# Patient Record
Sex: Female | Born: 1955 | Race: White | Hispanic: No | Marital: Married | Smoking: Never smoker
Health system: Southern US, Community
[De-identification: ages and names within clinical notes are randomized; demographics above are authoritative.]

## PROBLEM LIST (undated history)

## (undated) DIAGNOSIS — E785 Hyperlipidemia, unspecified: Secondary | ICD-10-CM

## (undated) DIAGNOSIS — T7840XA Allergy, unspecified, initial encounter: Secondary | ICD-10-CM

## (undated) DIAGNOSIS — M5090 Cervical disc disorder, unspecified, unspecified cervical region: Secondary | ICD-10-CM

## (undated) HISTORY — DX: Hyperlipidemia, unspecified: E78.5

## (undated) HISTORY — DX: Cervical disc disorder, unspecified, unspecified cervical region: M50.90

## (undated) HISTORY — DX: Allergy, unspecified, initial encounter: T78.40XA

---

## 1984-01-03 HISTORY — PX: TUBAL LIGATION: SHX77

## 1988-01-03 HISTORY — PX: ABDOMINAL HYSTERECTOMY: SHX81

## 1998-01-02 HISTORY — PX: CERVICAL DISC SURGERY: SHX588

## 1999-01-20 ENCOUNTER — Other Ambulatory Visit: Admission: RE | Admit: 1999-01-20 | Discharge: 1999-01-20 | Payer: Self-pay | Admitting: *Deleted

## 1999-05-11 ENCOUNTER — Emergency Department (HOSPITAL_COMMUNITY): Admission: EM | Admit: 1999-05-11 | Discharge: 1999-05-11 | Payer: Self-pay | Admitting: Emergency Medicine

## 2000-05-23 ENCOUNTER — Encounter: Payer: Self-pay | Admitting: Internal Medicine

## 2000-05-23 ENCOUNTER — Ambulatory Visit (HOSPITAL_COMMUNITY): Admission: RE | Admit: 2000-05-23 | Discharge: 2000-05-23 | Payer: Self-pay | Admitting: Internal Medicine

## 2000-05-31 ENCOUNTER — Encounter: Payer: Self-pay | Admitting: Neurology

## 2000-05-31 ENCOUNTER — Ambulatory Visit (HOSPITAL_COMMUNITY): Admission: RE | Admit: 2000-05-31 | Discharge: 2000-05-31 | Payer: Self-pay | Admitting: Neurology

## 2000-06-06 ENCOUNTER — Encounter: Payer: Self-pay | Admitting: Neurosurgery

## 2000-06-06 ENCOUNTER — Encounter: Admission: RE | Admit: 2000-06-06 | Discharge: 2000-06-06 | Payer: Self-pay | Admitting: Neurosurgery

## 2000-06-08 ENCOUNTER — Inpatient Hospital Stay (HOSPITAL_COMMUNITY): Admission: RE | Admit: 2000-06-08 | Discharge: 2000-06-09 | Payer: Self-pay | Admitting: Neurosurgery

## 2000-06-08 ENCOUNTER — Encounter: Payer: Self-pay | Admitting: Neurosurgery

## 2000-06-29 ENCOUNTER — Encounter: Payer: Self-pay | Admitting: Neurosurgery

## 2000-06-29 ENCOUNTER — Encounter: Admission: RE | Admit: 2000-06-29 | Discharge: 2000-06-29 | Payer: Self-pay | Admitting: Neurosurgery

## 2003-11-13 ENCOUNTER — Emergency Department (HOSPITAL_COMMUNITY): Admission: EM | Admit: 2003-11-13 | Discharge: 2003-11-13 | Payer: Self-pay | Admitting: Family Medicine

## 2004-03-24 ENCOUNTER — Other Ambulatory Visit: Admission: RE | Admit: 2004-03-24 | Discharge: 2004-03-24 | Payer: Self-pay | Admitting: Internal Medicine

## 2004-04-04 ENCOUNTER — Ambulatory Visit: Payer: Self-pay | Admitting: Gastroenterology

## 2004-04-25 ENCOUNTER — Ambulatory Visit: Payer: Self-pay | Admitting: Gastroenterology

## 2006-01-03 ENCOUNTER — Ambulatory Visit (HOSPITAL_COMMUNITY): Admission: RE | Admit: 2006-01-03 | Discharge: 2006-01-03 | Payer: Self-pay | Admitting: Internal Medicine

## 2009-07-13 ENCOUNTER — Other Ambulatory Visit: Admission: RE | Admit: 2009-07-13 | Discharge: 2009-07-13 | Payer: Self-pay | Admitting: Internal Medicine

## 2010-05-20 NOTE — H&P (Signed)
[place]. [facility]  Patient:    Kristina Hunter, Kristina Hunter                      MRN: [id number] Adm. 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 Sandria Manly, with a recent onset of stumbling with her right leg.  She said that she was fine until May 14, when she began having difficulty walking on the right side.  She also noted numbness and tingling in her right arm.  She describes that her right hand is painful, particularly when she tries to do things such as office work, Diplomatic Services operational officer, or computer work.  She notes problems with her balance with walking.  She denies any bowel or bladder dysfunction, although she does note decreased awareness and need to have a bowel movement and says she is near to having accidents but has not had any bowel accidents.  She denies any left-sided symptoms.  Kristina Hunter presented with an MRI of the brain and cervical spine.  The MRI of the cervical spine was performed at the request of Dr. Sandria Manly after a brain MRI demonstrated some minimal punctate white matter changes on T2 images but these were not terribly significant.  Her cervical MRI shows that she has spondylitic degenerative disk disease with baseline spinal stenosis at C3-4, C4-5, and C5-6 levels as well as some degenerative changes at the C6-7 disk level.  There is a large bilobed herniated disk at the C5-6 level which significant indents the spinal cord and on T-2 weighted images there is evidence of significantly increased signal within the cervical spinal port suggestive of  edema.  REVIEW OF SYSTEMS:  Detailed review of systems sheet was reviewed with the patient.  Pertinent positives include:  MUSCULOSKELETAL:  She notes arm weakness and leg weakness.  All other systems negative.  PAST MEDICAL HISTORY:  Operations significant for hysterectomy in 1989.  CURRENT MEDICATIONS: 1. Lipitor 10 mg daily for elevated cholesterol. 2. Cardura 4 mg daily for elevated blood pressure. 3. Clarinex 5 mg daily for allergies. 4. Flonase 1-2 sprays daily for allergies.  ALLERGIES:  She notes that she had a reaction to an ANTIBIOTIC, which caused her to develop a skin rash after treatment for sinus infection but she did not remember the name of that antibiotic.  She did not think it was penicillin.  I advised her to seek out that information so that she has it for future health encounters.  FAMILY HISTORY:  Mother is 41 in good health.  Father is 30 in good health.  SOCIAL HISTORY:  She is a nonsmoker, nondrinker.  No history of substance abuse or recent weight gain or loss.  She is 5 feet 10 inches tall and 240 pounds.  DIAGNOSTIC STUDIES:  As above.  PHYSICAL EXAMINATION:  GENERAL:  On examination today, Kristina Hunter is a very pleasant, cooperative, obese white female in no acute distress.  VITAL SIGNS:  Blood pressure 130/80 left arm seated and pulse is 80 and  regular.  HEENT:  Normocephalic, atraumatic.  The pupils are equal, round and reactive to light.  Extraocular muscles are full.  Sclerae white.  Conjunctivae pink. Oropharynx benign.  Uvula midline.  NECK:  There are no masses, meningismus, deformities, tracheal deviation, jugular venous distention, or carotid bruits.  There is normal cervical range of motion.  Spurlings test is negative without reproducible radicular pain turning the patients head to either side.  Positive Lhermittes sign with axial compression.  The patient notes that she has Lhermittes on flexion of her neck.  RESPIRATORY:  There  is normal respiratory effort with good intercostal function.  LUNGS:  Clear to auscultation.  There are no rales, rhonchi, or wheezes.  CARDIOVASCULAR:  Her heart is regular rate and rhythm to auscultation.  No murmurs are appreciated.  There is no extremity edema, clubbing, or cyanosis. There are palpable pedal pulses.  ABDOMEN:  Soft, nontender.  No hepatosplenomegaly appreciated or masses. There are active bowel sounds.  No guarding or rebound.  MUSCULOSKELETAL:  The patient is able to walk about the examining room with a normal heel-toe and casual gait.  NEUROLOGIC:  The patient is oriented to time, person, and place.  Notes good recall of both recent and remote memory with normal attention span and concentration.  She speaks clearly with clear and fluent speech and exhibits normal language function and appropriate fund of knowledge.  Cranial nerve examination:  Pupils are equal, round and reactive to light.  Extraocular movements are full.  Visual fields are full to confrontational testing. Facial sensation and facial motor intact and symmetric.  Hearing is intact to finger rub.  Palate is upgoing.  Shoulder shrug is symmetric.  Tongue protrudes in the midline.  Motor examination:  Motor strength is 4 to 4-/5 right triceps strength and 4-/5 right hand intrinsics, finger extensor, and wrist flexion strength.  Full left upper extremity strength in all motor groups.  In the lower extremities, motor strength is 5/5 in hip flexion-extension, quadriceps, hamstrings, plantar flexion, dorsiflexion, and extensor hallucis longus.  Sensory examination:  She notes some numbness in the right side of her body, both in the upper and lower extremities compared to the left side.  There is no clear pin sensory level.  Deep tendon reflexes are 2+ in the right biceps, 2 in the left biceps, 3 in right triceps, 2 in the left triceps, 3+ at the right knee, 2 in the left knee, 2 at the right ankle, 1  at the left ankle.  Great toes are downgoing on the left and upgoing on the right.  She has positive Hoffmanns sign on the right and negative on the  left.  Cerebellar examination:  Normal coordination in the upper and lower extremities and normal rapid alternating movements.  Romberg test is negative.  IMPRESSIONS AND RECOMMENDATIONS:  Kristina Hunter is a 55 year old woman with cervical spondylitic myelopathy with cervical spinal cord compression secondary to her herniated disk at the C5-6 level.  She has some mild degenerative changes and spinal cord compression at C3-4, C4-5, and to a lesser degree at the C6-7 level.  I recommended, based on Kristina Hunter examination and imaging findings, that she undergo an anterior cervical diskectomy and fusion at the C5-6 level.  I went over diagnostic studies in detail and reviewed surgical models and also discussed the exact nature of the surgical procedure, attendant risks, potential benefits, and typical operative and postoperative course.  I discussed the risks of surgery which include but are not  limited to the risks of anesthesia, blood loss, infection, injury to various neck structures including trachea and esophagus which could cause either temporary or permanent swallowing difficulties, and also the potential for perforation of the esophagus which might require operative intervention, larynx, recurrent laryngeal nerve which could cause either temporary or permanent vocal cord paralysis resulting in either temporary or permanent voice changes, injury to the cervical nerve roots which could cause either temporary or permanent arm pain and numbness and/or weakness.  There is a small chance of injury to the spinal cord which could cause paralysis.  There is also the potential for malplacement of instrumentation, fusion failure, need for repeat surgery, degenerative disease at other levels of the neck, failure to relieve pain, worsening of her  pain.  I also discussed that she will lose some neck mobility related to the surgery.  She understands this and wishes to proceed.  Surgery was set up for June 08, 2000. DD:  06/08/00 TD:  06/08/00 Job: [id number]

## 2010-05-20 NOTE — Op Note (Signed)
[place]. [facility]  Patient:    Kristina Hunter, Kristina Hunter                      MRN: [id number] Proc. Date: 06/08/00 Adm. Date:  [id number] Attending:  Josie Saunders                           Operative Report  PREOPERATIVE DIAGNOSES:  Herniated disk, spondylosis, stenosis, and cervical myelopathy with cervical degenerative disease at the C5-6 level.  POSTOPERATIVE DIAGNOSES:  Herniated disk, spondylosis, stenosis, and cervical myelopathy with cervical degenerative disease at the C5-6 level.  OPERATION:  Anterior cervical diskectomy and fusion at C5-6 with allograft anterior cervical plate.  SURGEON:  Danae Orleans. Venetia Maxon, M.D.  ANESTHESIA:  General endotracheal anesthesia.  ESTIMATED BLOOD LOSS:  Minimal.  COMPLICATIONS:  None.  DISPOSITION:  Recovery.  INDICATIONS:  Kristina Hunter is a 55 year old woman with a cervical myelopathy. She drags her right and has right arm numbness and right leg numbness and has hyperreflexia on the right side of her body.  She has a significant bilobe disk herniation at the C5-6 level causing spinal cord compression and has increased cord signal on MRI.  Brain MRI was fairly unremarkable.  It was elected to take Kristina Hunter to surgery for anterior cervical diskectomy and fusion.  DESCRIPTION OF PROCEDURE:  Kristina Hunter is brought to the operating room. Following satisfactory and uncomplicated induction of general endotracheal anesthesia, placement of intravenous lines, she was placed in supine position on the operating table.  Her neck was maintained in neutral alignment during intubation and surgery.  She was placed in 10 pounds of Holter traction after the smooth and uncomplicated induction of general endotracheal anesthesia. She was placed on a horseshoe head holder.  Her shoulders were wrapped to facilitate x-ray visualization during the operation.  Her anterior neck was then prepped and draped in the usual sterile  fashion.  The area of planned incision was infiltrated with 0.25% Marcaine, 0.50% lidocaine, 1:200,000 epinephrine.  The incision was made in the midline to the anterior border of the sternocleidomastoid muscle, carried sharply through platysmal layer. Subplatysmal dissection was performed exposing the anterior border of the sternocleidomastoid muscle.  Using blunt dissection, the trachea and esophagus were kept medial, carotid sheath kept lateral, exposing the anterior cervical spine.  A spinal needle was placed at what was felt to be the C5-6 level, and intraoperative x-ray confirmed this to be the C5-6 level.  Subsequently, the longus colli muscles were then taken down from C5 to C6 bilaterally using electrocautery and key elevator.  Shadow-Line retractor was then placed.  The disk space at C5-6 was then incised with a #15 blade, and disk material was removed in a piecemeal fashion.  Subsequently, the microscope was brought into the field.  Using microscopic visualization, the end plates of C5 and C6 were decorticated with high-speed drill (MicroMax drill with a 2 mm bur).   There were large osteophytes both from C6 as well as from the C5 levels, and these were drilled down with the drill.  Subsequently, the posterior longitudinal ligament was incised with an arachnoid knife, and the ligaments as well as the residual osteophytes were removed with a variety of 2 and 3 mm gold tipped Kerrison rongeurs.  Both C6 nerve roots were decompressed.  A large osteophyte was removed from the left side, decompressing the lateral spinal cord and C6 nerve root.  Hemostasis  was assured with Gelfoam soaked in thrombin, and 8 mm allograft tricortical hip iliac crest graft was cut to a depth of 13 mm and was inserted in the interspace and countersunk appropriately.  The Holter traction was removed.  The anterior surface of the cervical spine was then prepared by plating.  A 16 mm Tether anterior cervical  plate was then affixed to the anterior cervical spine with two screws in C5 and two screws in C6.  The 4 x 13 mm variable angle screws were used.  All screws had excellent purchase.  Locking mechanisms were engaged.  The wound was inspected to hemostasis.  Final x-ray confirmed positioning of bone graft and anterior cervical plate.  Subsequently, the wound was irrigated with bacitracin and saline.  The platysmal layer was reapproximated with 3-0 Vicryl interrupted inverted sutures.  Skin edges were reapproximated with running 4-0 Vicryl subcuticular stitch.  The wound was dressed with Benzoin and Steri-Strips and Telfa gauze tape.  The patient was extubated in the operating room and taken to the recovery room in stable and satisfactory condition having tolerated the operation well.  Counts were correct at the end of the case. DD:  06/08/00 TD:  06/08/00 Job: [id number]

## 2011-03-24 ENCOUNTER — Encounter: Payer: Self-pay | Admitting: Gastroenterology

## 2012-01-29 ENCOUNTER — Encounter: Payer: Self-pay | Admitting: Gastroenterology

## 2013-02-07 ENCOUNTER — Other Ambulatory Visit: Payer: Self-pay | Admitting: Physician Assistant

## 2013-02-07 MED ORDER — CIPROFLOXACIN HCL 500 MG PO TABS: 500.0000 mg | ORAL_TABLET | Freq: Two times a day (BID) | ORAL | Status: AC

## 2013-03-22 ENCOUNTER — Other Ambulatory Visit: Payer: Self-pay | Admitting: Internal Medicine

## 2013-03-22 MED ORDER — CIPROFLOXACIN HCL 500 MG PO TABS: 500.0000 mg | ORAL_TABLET | Freq: Two times a day (BID) | ORAL | Status: AC

## 2013-04-02 ENCOUNTER — Other Ambulatory Visit: Payer: Self-pay | Admitting: *Deleted

## 2013-04-02 ENCOUNTER — Other Ambulatory Visit: Payer: Self-pay | Admitting: Internal Medicine

## 2013-04-02 ENCOUNTER — Telehealth: Payer: Self-pay | Admitting: *Deleted

## 2013-04-02 MED ORDER — CIPROFLOXACIN HCL 500 MG PO TABS: 500.0000 mg | ORAL_TABLET | Freq: Two times a day (BID) | ORAL | Status: DC

## 2013-04-02 NOTE — Telephone Encounter (Signed)
Patient called and states she took Cipro Dr Melford Aase called in last weekend and now symptoms of UTI returned. Ok to call in RX for Cipro 500 mg #14 to [facility] sig: 1 tab BID pc.  Patient scheduled NV to  check UA and urine culture (dx-595.0) in 3-4 weeks.

## 2013-04-03 ENCOUNTER — Telehealth: Payer: Self-pay | Admitting: *Deleted

## 2013-04-03 ENCOUNTER — Other Ambulatory Visit: Payer: Self-pay | Admitting: Emergency Medicine

## 2013-04-03 MED ORDER — CIPROFLOXACIN HCL 500 MG PO TABS: 500.0000 mg | ORAL_TABLET | Freq: Two times a day (BID) | ORAL | Status: AC

## 2013-04-03 NOTE — Telephone Encounter (Signed)
RX FOR CIPRO WAS SUPPOSE TO GO TO [facility] WILL YOU REROUTE THE RX?  PLEASE & THANKS MS

## 2013-04-03 NOTE — Telephone Encounter (Signed)
pls inform her I am sorry that happened and it has been sent to [facility]

## 2013-04-11 ENCOUNTER — Other Ambulatory Visit: Payer: Self-pay | Admitting: Internal Medicine

## 2013-04-23 ENCOUNTER — Other Ambulatory Visit: Payer: Self-pay | Admitting: Internal Medicine

## 2013-04-23 ENCOUNTER — Encounter (INDEPENDENT_AMBULATORY_CARE_PROVIDER_SITE_OTHER): Payer: Self-pay

## 2013-04-23 ENCOUNTER — Ambulatory Visit (INDEPENDENT_AMBULATORY_CARE_PROVIDER_SITE_OTHER): Payer: 59 | Admitting: *Deleted

## 2013-04-23 DIAGNOSIS — N3000 Acute cystitis without hematuria: Secondary | ICD-10-CM

## 2013-04-23 LAB — URINALYSIS, ROUTINE W REFLEX MICROSCOPIC
Bilirubin Urine: NEGATIVE
Glucose, UA: NEGATIVE mg/dL
Hgb urine dipstick: NEGATIVE
Ketones, ur: NEGATIVE mg/dL
Nitrite: NEGATIVE
Protein, ur: NEGATIVE mg/dL
Specific Gravity, Urine: 1.006 (ref 1.005–1.030)
Urobilinogen, UA: 0.2 mg/dL (ref 0.0–1.0)
pH: 6 (ref 5.0–8.0)

## 2013-04-23 LAB — URINALYSIS, MICROSCOPIC ONLY
Bacteria, UA: NONE SEEN
CRYSTALS: NONE SEEN
Casts: NONE SEEN
RBC / HPF: NONE SEEN RBC/hpf (ref <3)
SQUAMOUS EPITHELIAL / LPF: NONE SEEN
WBC UA: NONE SEEN WBC/hpf (ref <3)

## 2013-04-23 NOTE — Progress Notes (Signed)
Patient ID: Kristina Hunter, female   DOB: Dec 03, 1955, 58 y.o.   MRN: [id number] Patient presents for recheck UA/C&S.  Patient denies any current symptoms.  States was given three rounds of abx and just here to be sure infection has cleared.

## 2013-04-24 LAB — URINE CULTURE: Colony Count: 8000

## 2013-06-08 ENCOUNTER — Other Ambulatory Visit: Payer: Self-pay | Admitting: Emergency Medicine

## 2013-07-10 ENCOUNTER — Other Ambulatory Visit: Payer: Self-pay | Admitting: Emergency Medicine

## 2013-08-13 ENCOUNTER — Encounter: Payer: Self-pay | Admitting: Podiatry

## 2013-08-13 ENCOUNTER — Ambulatory Visit (INDEPENDENT_AMBULATORY_CARE_PROVIDER_SITE_OTHER): Payer: 59 | Admitting: Podiatry

## 2013-08-13 VITALS — BP 149/74 | HR 79 | Resp 15 | Ht 70.0 in | Wt 210.0 lb

## 2013-08-13 DIAGNOSIS — L03039 Cellulitis of unspecified toe: Secondary | ICD-10-CM

## 2013-08-13 MED ORDER — CEPHALEXIN 500 MG PO CAPS: 500.0000 mg | ORAL_CAPSULE | Freq: Three times a day (TID) | ORAL | Status: DC

## 2013-08-13 NOTE — Patient Instructions (Signed)

## 2013-08-13 NOTE — Progress Notes (Signed)
   Subjective:    Patient ID: Kristina Hunter, female    DOB: 1955/08/31, 58 y.o.   MRN: [id number]  HPI Comments: Kristina Hunter presents the office today with complaints of left hallux nail pain. She states that she stubbed the toe over the weekend and since the nail has become loose with some associated drainage from around the nail. She denies any systemic complaints at this fevers, chills, nausea, vomiting. No other complaints at this time.     Review of Systems  Skin:       Left big toenail pain  All other systems reviewed and are negative.      Objective:   Physical Exam  Nursing note and vitals reviewed. Constitutional: She is oriented to person, place, and time. She appears well-developed and well-nourished.  Musculoskeletal: She exhibits no edema.  Tenderness to palpation over the left hallux nail.  Neurological: She is alert and oriented to person, place, and time.  Protective sensation intact.  Skin:  Left hallux nail separated from the nailbed with serous drainage from around the nail. No associated erythema.   DP/PT pulses palpable 2/4 b/l. CRt <3 sec.         Assessment & Plan:  58 year old female with left hallux onycholysis, pain. -Conservative or surgical intervention was discussed the patient in detail including alternatives, risks, locations.  -At this time recommended total nail avulsion due to the separation of the nailbed and surrounding drainage and pain the nail. Patient agrees. - Following sterile skin preparation a total of 2.5 cc of the 1-1 mixture 2% lidocaine plain and 0.5% Marcaine plain was infiltrated and a hallux block fashion on the left foot. The skin was then prepped in normal sterile fashion. The left hallux nail with a completely removed. During the procedure there was noted to be a pocket of purulence underneath the nail. The area was then debrided and was flushed. There is no open wound in the nailbed. Silvadene was applied followed by a dry  sterile dressing. A tourniquet was utilized for the procedure at the conclusion was removed. Following the procedure the CRT< 3 sec. patient tolerated procedure well the any complications. -Due to the collection of purulence patient prescribed Keflex. -Post procedure instructions discussed with the patient in detail including daily soaks and covering the site was in a leg ointment and a Band-Aid. -Patient educated on signs and symptoms of infection and directed to call the office or go to the emergency room immediately if any occur. -Followup in 1 week or sooner if any problems are to arise.

## 2013-08-15 ENCOUNTER — Telehealth: Payer: Self-pay | Admitting: Podiatry

## 2013-08-15 NOTE — Telephone Encounter (Signed)
Called patient to follow up with her after her procedure, no answer. Directed to call with any questions/concerns.

## 2013-08-21 ENCOUNTER — Ambulatory Visit (INDEPENDENT_AMBULATORY_CARE_PROVIDER_SITE_OTHER): Payer: 59 | Admitting: Podiatry

## 2013-08-21 VITALS — BP 126/81 | HR 82 | Resp 16

## 2013-08-21 DIAGNOSIS — L03039 Cellulitis of unspecified toe: Secondary | ICD-10-CM

## 2013-08-21 NOTE — Patient Instructions (Signed)
Continue epsom salt soaks until healed. Antibiotic ointment and band-aid until healed. Monitor for any signs/symptoms of infection. Call the office immediately if any occur or go directly to the emergency room. Call with any questions/concerns.

## 2013-08-21 NOTE — Progress Notes (Signed)
Subjective:     Patient ID: Kristina Hunter, female   DOB: [birth year], 58 y.o.   MRN: [id number]  HPI Patient presents to the office for follow up after left hallux nail avulsion. She has continued with epsom salt soaks. Currently denies any pain. No drainage. Has been taking antibiotic as directed. Denies any systemic complaints such as fevers, chills, nausea, vomiting. Any complaints.  Review of Systems  All other systems reviewed and are negative.      Objective:   Physical Exam AAO x3, NAD DP/PT pulses palpable CRT < sec.  Left hallux nail status post removal without any clinical signs of infection. No drainage, erythema, ascending cellulitis. No tenderness to palpation Protective sensation intact.    Assessment:     58 year old female 1 week status post left hallux nail removal    Plan:     The patient is doing well without any clinical signs of infection. Continue with Epsom salt soaks until healed. Continue with antibiotic ointment over the area followed by a Band-Aid. Keep covered during the day and can uncover night. Finish the course of antibiotics. Clinical signs of infection discussed with the patient and directed to call the office immediately or go to the emergency room and are to occur. Followup in 2 weeks or sooner if any problems are to arise.

## 2013-09-04 ENCOUNTER — Ambulatory Visit: Payer: 59 | Admitting: Podiatry

## 2013-09-06 ENCOUNTER — Other Ambulatory Visit: Payer: Self-pay | Admitting: Physician Assistant

## 2013-09-20 DIAGNOSIS — Z9109 Other allergy status, other than to drugs and biological substances: Secondary | ICD-10-CM | POA: Insufficient documentation

## 2013-09-20 DIAGNOSIS — E785 Hyperlipidemia, unspecified: Secondary | ICD-10-CM | POA: Insufficient documentation

## 2013-09-20 DIAGNOSIS — T7840XA Allergy, unspecified, initial encounter: Secondary | ICD-10-CM

## 2013-09-22 ENCOUNTER — Encounter: Payer: Self-pay | Admitting: Physician Assistant

## 2013-09-22 ENCOUNTER — Ambulatory Visit (INDEPENDENT_AMBULATORY_CARE_PROVIDER_SITE_OTHER): Payer: 59 | Admitting: Physician Assistant

## 2013-09-22 VITALS — BP 122/78 | HR 76 | Temp 97.9°F | Resp 16 | Ht 69.5 in | Wt 222.0 lb

## 2013-09-22 DIAGNOSIS — Z1212 Encounter for screening for malignant neoplasm of rectum: Secondary | ICD-10-CM

## 2013-09-22 DIAGNOSIS — Z0000 Encounter for general adult medical examination without abnormal findings: Secondary | ICD-10-CM

## 2013-09-22 DIAGNOSIS — I10 Essential (primary) hypertension: Secondary | ICD-10-CM

## 2013-09-22 DIAGNOSIS — E785 Hyperlipidemia, unspecified: Secondary | ICD-10-CM

## 2013-09-22 DIAGNOSIS — N63 Unspecified lump in unspecified breast: Secondary | ICD-10-CM

## 2013-09-22 DIAGNOSIS — N6320 Unspecified lump in the left breast, unspecified quadrant: Secondary | ICD-10-CM

## 2013-09-22 LAB — CBC WITH DIFFERENTIAL/PLATELET
BASOS PCT: 1 % (ref 0–1)
Basophils Absolute: 0.1 10*3/uL (ref 0.0–0.1)
Eosinophils Absolute: 0.2 10*3/uL (ref 0.0–0.7)
Eosinophils Relative: 3 % (ref 0–5)
HCT: 40.9 % (ref 36.0–46.0)
HEMOGLOBIN: 13.7 g/dL (ref 12.0–15.0)
Lymphocytes Relative: 37 % (ref 12–46)
Lymphs Abs: 2.2 10*3/uL (ref 0.7–4.0)
MCH: 30.7 pg (ref 26.0–34.0)
MCHC: 33.5 g/dL (ref 30.0–36.0)
MCV: 91.7 fL (ref 78.0–100.0)
MONOS PCT: 7 % (ref 3–12)
Monocytes Absolute: 0.4 10*3/uL (ref 0.1–1.0)
NEUTROS ABS: 3.1 10*3/uL (ref 1.7–7.7)
Neutrophils Relative %: 52 % (ref 43–77)
Platelets: 302 10*3/uL (ref 150–400)
RBC: 4.46 MIL/uL (ref 3.87–5.11)
RDW: 12.9 % (ref 11.5–15.5)
WBC: 5.9 10*3/uL (ref 4.0–10.5)

## 2013-09-22 LAB — HEMOGLOBIN A1C
Hgb A1c MFr Bld: 6.1 % — ABNORMAL HIGH (ref <5.7)
Mean Plasma Glucose: 128 mg/dL — ABNORMAL HIGH (ref <117)

## 2013-09-22 NOTE — Progress Notes (Signed)
Complete Physical  Assessment and Plan: 1. Routine general medical examination at a health care facility - CBC with Differential - BASIC METABOLIC PANEL WITH GFR - Hepatic function panel - Lipid panel - TSH - Hemoglobin A1c - Insulin, fasting - Vit D  25 hydroxy (rtn osteoporosis monitoring) - Microalbumin / creatinine urine ratio - Urinalysis, Routine w reflex microscopic - Vitamin B12 - Magnesium - Iron and TIBC - Ferritin - EKG 12-Lead  2. Screening for rectal cancer - POC Hemoccult Bld/Stl (3-Cd Home Screen); Future  3. Other and unspecified hyperlipidemia -continue medications, check lipids, decrease fatty foods, increase activity.   4. Unspecified essential hypertension Continue HCTZ/monitor, DASH diet  5. Breast mass, left - MM Digital Diagnostic Unilat L; Future   Discussed med's effects and SE's. Screening labs and tests as requested with regular follow-up as recommended.  HPI 58 y.o. female  presents for a complete physical.  Her blood pressure has been controlled at home, today their BP is BP: 122/78 mmHg She does not workout due to stress/busy. She denies chest pain, shortness of breath, dizziness.  She is on cholesterol medication and denies myalgias. Her cholesterol is at goal. The cholesterol last visit was:  LDL 96  She has been working on diet and exercise for prediabetes, and denies paresthesia of the feet, polydipsia and polyuria. Last A1C in the office was: 5.9 Patient is on Vitamin D supplement.   She has had a dry cough for 3 weeks, a lot of sinus drainage 3 weeks ago but this has improved but she continues to have some sinus pressure.  She is on clarinex.  BMI is Body mass index is 32.32 kg/(m^2)., She is struggling with weight loss due to stress (at work), time limitations.  Wt Readings from Last 3 Encounters:  09/22/13 222 lb (100.699 kg)  08/13/13 210 lb (95.255 kg)  She has incontinence and takes vesicare which helps.    Current  Medications:  Current Outpatient Prescriptions on File Prior to Visit  Medication Sig Dispense Refill  . atorvastatin (LIPITOR) 10 MG tablet Take 1 tablet (10 mg total) by mouth daily.  90 tablet  0  . BABY ASPIRIN PO Take 81 mg by mouth daily.      . Calcium-Vitamin D (CALTRATE 600 PLUS-VIT D PO) Take by mouth daily.      Marland Kitchen desloratadine (CLARINEX) 5 MG tablet TAKE 1 TABLET DAILY (FOR ALLERGIES)  90 tablet  99  . Multiple Vitamins-Minerals (MULTIVITAMIN PO) Take by mouth daily.      . Omega-3 Fatty Acids (FISH OIL PO) Take by mouth daily.      . Solifenacin Succinate (VESICARE PO) Take by mouth.       No current facility-administered medications on file prior to visit.   Health Maintenance:   Immunization History  Administered Date(s) Administered  . Tdap 07/13/2009   Tetanus: 2011 Pneumovax: 1997 Flu vaccine: gets at work Zostavax: Pap: 2011 declines MGM: 06/2013 DEXA: Colonoscopy: 2006 due 2016 EGD:  Patient Care Team: [name], MD as PCP - General (Internal Medicine) Inda Castle, MD as Consulting Physician (Gastroenterology) Johnny Bridge, MD as Consulting Physician (Orthopedic Surgery) Simona Huh, MD as Consulting Physician (Dermatology) [name], eye doctor - Feb 2015 [facility] q 6 months  Allergies: No Known Allergies Medical History:  Past Medical History  Diagnosis Date  . Hyperlipidemia   . Allergy    Surgical History:  Past Surgical History  Procedure Laterality Date  . Tubal  ligation Bilateral 1986    BTL  . Abdominal hysterectomy  1990   Family History:  Family History  Problem Relation Age of Onset  . Arthritis Mother   . Hyperlipidemia Mother   . Hypertension Mother   . Hypertension Father   . Diabetes Father   . Hyperlipidemia Father   . Arthritis Father    Social History:  History  Substance Use Topics  . Smoking status: Never Smoker   . Smokeless tobacco: Never Used  . Alcohol Use: No    Review  of Systems: [X]  = complains of  [ ]  = denies  General: Fatigue [ ]  Fever [ ]  Chills [ ]  Weakness [ ]   Insomnia [ ] Weight change [ ]  Night sweats [ ]   Change in appetite [ ]  Eyes: Redness [ ]  Blurred vision [ ]  Diplopia [ ]  Discharge [ ]   ENT: Congestion [ ]  Sinus Pain [ ]  Post Nasal Drip [x ] Sore Throat [ ]  Earache [ ]  hearing loss [ ]  Tinnitus [ ]  Snoring [ ]   Cardiac: Chest pain/pressure [ ]  SOB [ ]  Orthopnea [ ]   Palpitations [ ]   Paroxysmal nocturnal dyspnea[ ]  Claudication [ ]  Edema [ ]   Pulmonary: Cough [x ] Wheezing[ ]   SOB [ ]   Pleurisy [ ]   GI: Nausea [ ]  Vomiting[ ]  Dysphagia[ ]  Heartburn[ ]  Abdominal pain [ ]  Constipation [ ] ; Diarrhea [ ]  BRBPR [ ]  Melena[ ]  Bloating [ ]  Hemorrhoids [ ]   GU: Hematuria[ ]  Dysuria [ ]  Nocturia[ ]  Urgency [ ]   Hesitancy [ ]  Discharge [ ]  Frequency [ ]   Breast:  Breast lumps [ ]   nipple discharge [ ]    Neuro: Headaches[ ]  Vertigo[ ]  Paresthesias[ ]  Spasm [ ]  Speech changes [ ]  Incoordination [ ]   Ortho: Arthritis [ ]  Joint pain [ ]  Muscle pain [ ]  Joint swelling [ ]  Back Pain [x ] Skin:  Rash [ ]   Pruritis [ ]  Change in skin lesion [ ]   Psych: Depression[ ]  Anxiety[ ]  Confusion [ ]  Memory loss [ ]   Heme/Lypmh: Bleeding [ ]  Bruising [ ]  Enlarged lymph nodes [ ]   Endocrine: Visual blurring [ ]  Paresthesia [ ]  Polyuria [ ]  Polydypsea [ ]    Heat/cold intolerance [ ]  Hypoglycemia [ ]   Physical Exam: Estimated body mass index is 32.32 kg/(m^2) as calculated from the following:   Height as of this encounter: 5' 9.5" (1.765 m).   Weight as of this encounter: 222 lb (100.699 kg). BP 122/78  Pulse 76  Temp(Src) 97.9 F (36.6 C)  Resp 16  Ht 5' 9.5" (1.765 m)  Wt 222 lb (100.699 kg)  BMI 32.32 kg/m2 General Appearance: Well nourished, in no apparent distress. Eyes: PERRLA, EOMs, conjunctiva no swelling or erythema, normal fundi and vessels. Sinuses: No Frontal/maxillary tenderness ENT/Mouth: Ext aud canals clear, normal light reflex with TMs without  erythema, bulging.  Good dentition. No erythema, swelling, or exudate on post pharynx. Tonsils not swollen or erythematous. Hearing normal.  Neck: Supple, thyroid normal. No bruits Respiratory: Respiratory effort normal, BS equal bilaterally without rales, rhonchi, wheezing or stridor. Cardio: RRR without murmurs, rubs or gallops. Brisk peripheral pulses without edema.  Chest: symmetric, with normal excursions and percussion. Breasts: Symmetric, + left breast mobile mass 0.5-1 cm in size nontender 1.5-2 inches from nipple at 9 oclock, without nipple discharge, retractions. Abdomen: Soft, +BS. Non tender, no guarding, rebound, hernias, masses, or organomegaly. .  Lymphatics: Non tender without lymphadenopathy.  Genitourinary: defer Musculoskeletal: Full ROM all peripheral extremities,5/5 strength, and normal gait. Skin: Warm, dry without rashes, lesions, ecchymosis.  Neuro: Cranial nerves intact, reflexes equal bilaterally. Normal muscle tone, no cerebellar symptoms. Sensation intact.  Psych: Awake and oriented X 3, normal affect, Insight and Judgment appropriate.   EKG: WNL no changes. AORTA SCAN: WNL    Vicie Mutters 10:25 AM

## 2013-09-22 NOTE — Patient Instructions (Signed)

## 2013-09-23 LAB — TSH: TSH: 1.469 u[IU]/mL (ref 0.350–4.500)

## 2013-09-23 LAB — URINALYSIS, ROUTINE W REFLEX MICROSCOPIC
BILIRUBIN URINE: NEGATIVE
Glucose, UA: NEGATIVE mg/dL
HGB URINE DIPSTICK: NEGATIVE
KETONES UR: NEGATIVE mg/dL
Leukocytes, UA: NEGATIVE
NITRITE: NEGATIVE
PH: 5.5 (ref 5.0–8.0)
Protein, ur: NEGATIVE mg/dL
Specific Gravity, Urine: 1.009 (ref 1.005–1.030)
Urobilinogen, UA: 0.2 mg/dL (ref 0.0–1.0)

## 2013-09-23 LAB — LIPID PANEL
CHOLESTEROL: 177 mg/dL (ref 0–200)
HDL: 47 mg/dL (ref >39)
LDL CALC: 93 mg/dL (ref 0–99)
TRIGLYCERIDES: 185 mg/dL — AB (ref <150)
Total CHOL/HDL Ratio: 3.8 Ratio
VLDL: 37 mg/dL (ref 0–40)

## 2013-09-23 LAB — BASIC METABOLIC PANEL WITH GFR
BUN: 17 mg/dL (ref 6–23)
CO2: 25 mEq/L (ref 19–32)
Calcium: 9.9 mg/dL (ref 8.4–10.5)
Chloride: 101 mEq/L (ref 96–112)
Creat: 0.96 mg/dL (ref 0.50–1.10)
GFR, EST AFRICAN AMERICAN: 75 mL/min
GFR, EST NON AFRICAN AMERICAN: 65 mL/min
Glucose, Bld: 85 mg/dL (ref 70–99)
POTASSIUM: 4.6 meq/L (ref 3.5–5.3)
SODIUM: 139 meq/L (ref 135–145)

## 2013-09-23 LAB — HEPATIC FUNCTION PANEL
ALT: 24 U/L (ref 0–35)
AST: 23 U/L (ref 0–37)
Albumin: 4.5 g/dL (ref 3.5–5.2)
Alkaline Phosphatase: 59 U/L (ref 39–117)
BILIRUBIN DIRECT: 0.1 mg/dL (ref 0.0–0.3)
Indirect Bilirubin: 0.4 mg/dL (ref 0.2–1.2)
Total Bilirubin: 0.5 mg/dL (ref 0.2–1.2)
Total Protein: 7.4 g/dL (ref 6.0–8.3)

## 2013-09-23 LAB — MICROALBUMIN / CREATININE URINE RATIO
Creatinine, Urine: 52.5 mg/dL
Microalb Creat Ratio: 9.5 mg/g (ref 0.0–30.0)
Microalb, Ur: 0.5 mg/dL (ref 0.00–1.89)

## 2013-09-23 LAB — INSULIN, FASTING: INSULIN FASTING, SERUM: 6.8 u[IU]/mL (ref 2.0–19.6)

## 2013-09-23 LAB — FERRITIN: Ferritin: 44 ng/mL (ref 10–291)

## 2013-09-23 LAB — IRON AND TIBC
%SAT: 35 % (ref 20–55)
IRON: 136 ug/dL (ref 42–145)
TIBC: 387 ug/dL (ref 250–470)
UIBC: 251 ug/dL (ref 125–400)

## 2013-09-23 LAB — VITAMIN B12: Vitamin B-12: 654 pg/mL (ref 211–911)

## 2013-09-23 LAB — MAGNESIUM: Magnesium: 2.2 mg/dL (ref 1.5–2.5)

## 2013-09-23 LAB — VITAMIN D 25 HYDROXY (VIT D DEFICIENCY, FRACTURES): VIT D 25 HYDROXY: 49 ng/mL (ref 30–89)

## 2013-09-29 ENCOUNTER — Other Ambulatory Visit: Payer: Self-pay | Admitting: Physician Assistant

## 2013-09-29 DIAGNOSIS — N6320 Unspecified lump in the left breast, unspecified quadrant: Secondary | ICD-10-CM

## 2013-10-02 ENCOUNTER — Telehealth: Payer: Self-pay

## 2013-10-02 NOTE — Telephone Encounter (Signed)
Patient scheduled at the [facility] on 10-06-13 at 8am for diagnostic mammogram. Left message for patient to return my call.

## 2013-10-02 NOTE — Telephone Encounter (Signed)
Message copied by Nadyne Coombes on Thu Oct 02, 2013  9:10 AM ------      Message from: Vicie Mutters R      Created: Mon Sep 22, 2013  1:23 PM       Diagnostic MGM left ------

## 2013-10-06 ENCOUNTER — Ambulatory Visit
Admission: RE | Admit: 2013-10-06 | Discharge: 2013-10-06 | Disposition: A | Discharge status: Home / Self Care | Payer: 59 | Source: Ambulatory Visit | Attending: Physician Assistant | Admitting: Physician Assistant

## 2013-10-06 ENCOUNTER — Other Ambulatory Visit: Payer: Self-pay | Admitting: Physician Assistant

## 2013-10-06 ENCOUNTER — Encounter (INDEPENDENT_AMBULATORY_CARE_PROVIDER_SITE_OTHER): Payer: Self-pay

## 2013-10-06 DIAGNOSIS — N6320 Unspecified lump in the left breast, unspecified quadrant: Secondary | ICD-10-CM

## 2013-10-08 ENCOUNTER — Other Ambulatory Visit: Payer: Self-pay | Admitting: Physician Assistant

## 2013-12-02 ENCOUNTER — Other Ambulatory Visit: Payer: Self-pay | Admitting: *Deleted

## 2013-12-02 MED ORDER — SOLIFENACIN SUCCINATE 10 MG PO TABS: 10.0000 mg | ORAL_TABLET | Freq: Every day | ORAL | Status: DC

## 2014-05-09 ENCOUNTER — Other Ambulatory Visit: Payer: Self-pay | Admitting: Internal Medicine

## 2014-05-09 DIAGNOSIS — E783 Hyperchylomicronemia: Secondary | ICD-10-CM

## 2014-05-09 MED ORDER — ATORVASTATIN CALCIUM 10 MG PO TABS: 10.0000 mg | ORAL_TABLET | Freq: Every day | ORAL | Status: DC

## 2014-06-15 ENCOUNTER — Other Ambulatory Visit: Payer: Self-pay

## 2014-06-15 DIAGNOSIS — Z1231 Encounter for screening mammogram for malignant neoplasm of breast: Secondary | ICD-10-CM

## 2014-06-30 ENCOUNTER — Ambulatory Visit
Admission: RE | Admit: 2014-06-30 | Discharge: 2014-06-30 | Disposition: A | Discharge status: Home / Self Care | Payer: 59 | Source: Ambulatory Visit

## 2014-06-30 DIAGNOSIS — Z1231 Encounter for screening mammogram for malignant neoplasm of breast: Secondary | ICD-10-CM

## 2014-07-23 ENCOUNTER — Ambulatory Visit (INDEPENDENT_AMBULATORY_CARE_PROVIDER_SITE_OTHER): Payer: 59 | Admitting: Physician Assistant

## 2014-07-23 ENCOUNTER — Encounter: Payer: Self-pay | Admitting: Physician Assistant

## 2014-07-23 VITALS — BP 136/88 | HR 80 | Temp 98.1°F | Resp 16 | Ht 69.5 in | Wt 222.6 lb

## 2014-07-23 DIAGNOSIS — N3000 Acute cystitis without hematuria: Secondary | ICD-10-CM

## 2014-07-23 MED ORDER — FLUCONAZOLE 150 MG PO TABS: 150.0000 mg | ORAL_TABLET | Freq: Every day | ORAL | Status: DC

## 2014-07-23 MED ORDER — CIPROFLOXACIN HCL 500 MG PO TABS: 500.0000 mg | ORAL_TABLET | Freq: Two times a day (BID) | ORAL | Status: AC

## 2014-07-23 NOTE — Patient Instructions (Signed)

## 2014-07-23 NOTE — Progress Notes (Signed)
HPI: complains of UTI symptoms Onset yesterday days ago, progressively worse associated with dysuria and small volume voiding with increased frequency, some suprapubic pain denies hematuria, flank pain or fever  The patient has a history of prior UTI  PMH: reviewed  ROS:  Gen.: No unexpected weight change, no night sweats Lungs: No cough or shortness of breath Cardiovascular: No palpitations or chest pain  PE: BP 136/88 mmHg  Pulse 80  Temp(Src) 98.1 F (36.7 C)  Resp 16  Ht 5' 9.5" (1.765 m)  Wt 222 lb 9.6 oz (100.971 kg)  BMI 32.41 kg/m2 General: No acute distress Lungs: Clear to auscultation Cardiovascular: Regular rate rhythm, no edema Abdomen: Mild to moderate discomfort of her suprapubic region, no flank tenderness to palpation  Lab Results  Component Value Date   WBC 5.9 09/22/2013   HGB 13.7 09/22/2013   HCT 40.9 09/22/2013   PLT 302 09/22/2013   GLUCOSE 85 09/22/2013   CHOL 177 09/22/2013   TRIG 185* 09/22/2013   HDL 47 09/22/2013   LDLCALC 93 09/22/2013   ALT 24 09/22/2013   AST 23 09/22/2013   NA 139 09/22/2013   K 4.6 09/22/2013   CL 101 09/22/2013   CREATININE 0.96 09/22/2013   BUN 17 09/22/2013   CO2 25 09/22/2013   TSH 1.469 09/22/2013   HGBA1C 6.1* 09/22/2013   MICROALBUR 0.50 09/22/2013    Assessment/Plan: UTI, classic symptoms with history of same  Empiric antibiotic x7 days Urine culture for identification and sensitivities Hydration recommended education provided

## 2014-07-24 LAB — URINALYSIS, ROUTINE W REFLEX MICROSCOPIC
Bilirubin Urine: NEGATIVE
Glucose, UA: NEGATIVE mg/dL
HGB URINE DIPSTICK: NEGATIVE
Ketones, ur: NEGATIVE mg/dL
LEUKOCYTES UA: NEGATIVE
NITRITE: NEGATIVE
PH: 6 (ref 5.0–8.0)
PROTEIN: NEGATIVE mg/dL
Specific Gravity, Urine: 1.008 (ref 1.005–1.030)
Urobilinogen, UA: 0.2 mg/dL (ref 0.0–1.0)

## 2014-07-24 LAB — URINE CULTURE
Colony Count: NO GROWTH
Organism ID, Bacteria: NO GROWTH

## 2014-09-18 ENCOUNTER — Other Ambulatory Visit: Payer: Self-pay

## 2014-09-18 MED ORDER — SOLIFENACIN SUCCINATE 10 MG PO TABS: 10.0000 mg | ORAL_TABLET | Freq: Every day | ORAL | Status: DC

## 2014-09-23 ENCOUNTER — Encounter: Payer: Self-pay | Admitting: Physician Assistant

## 2014-10-20 ENCOUNTER — Ambulatory Visit (INDEPENDENT_AMBULATORY_CARE_PROVIDER_SITE_OTHER): Payer: 59 | Admitting: Physician Assistant

## 2014-10-20 ENCOUNTER — Encounter: Payer: Self-pay | Admitting: Physician Assistant

## 2014-10-20 VITALS — BP 128/70 | HR 74 | Temp 97.3°F | Resp 14 | Ht 69.0 in | Wt 223.0 lb

## 2014-10-20 DIAGNOSIS — I10 Essential (primary) hypertension: Secondary | ICD-10-CM | POA: Diagnosis not present

## 2014-10-20 DIAGNOSIS — Z0000 Encounter for general adult medical examination without abnormal findings: Secondary | ICD-10-CM | POA: Diagnosis not present

## 2014-10-20 DIAGNOSIS — Z23 Encounter for immunization: Secondary | ICD-10-CM | POA: Diagnosis not present

## 2014-10-20 DIAGNOSIS — E559 Vitamin D deficiency, unspecified: Secondary | ICD-10-CM

## 2014-10-20 DIAGNOSIS — Z1159 Encounter for screening for other viral diseases: Secondary | ICD-10-CM

## 2014-10-20 DIAGNOSIS — R7309 Other abnormal glucose: Secondary | ICD-10-CM | POA: Insufficient documentation

## 2014-10-20 DIAGNOSIS — R32 Unspecified urinary incontinence: Secondary | ICD-10-CM

## 2014-10-20 DIAGNOSIS — R7303 Prediabetes: Secondary | ICD-10-CM

## 2014-10-20 DIAGNOSIS — Z0001 Encounter for general adult medical examination with abnormal findings: Secondary | ICD-10-CM

## 2014-10-20 DIAGNOSIS — E669 Obesity, unspecified: Secondary | ICD-10-CM

## 2014-10-20 DIAGNOSIS — E783 Hyperchylomicronemia: Secondary | ICD-10-CM

## 2014-10-20 DIAGNOSIS — T7840XD Allergy, unspecified, subsequent encounter: Secondary | ICD-10-CM

## 2014-10-20 DIAGNOSIS — R0989 Other specified symptoms and signs involving the circulatory and respiratory systems: Secondary | ICD-10-CM

## 2014-10-20 DIAGNOSIS — Z79899 Other long term (current) drug therapy: Secondary | ICD-10-CM

## 2014-10-20 DIAGNOSIS — E785 Hyperlipidemia, unspecified: Secondary | ICD-10-CM

## 2014-10-20 LAB — CBC WITH DIFFERENTIAL/PLATELET
BASOS ABS: 0.1 10*3/uL (ref 0.0–0.1)
Basophils Relative: 1 % (ref 0–1)
EOS ABS: 0.2 10*3/uL (ref 0.0–0.7)
EOS PCT: 3 % (ref 0–5)
HCT: 41.8 % (ref 36.0–46.0)
Hemoglobin: 13.8 g/dL (ref 12.0–15.0)
Lymphocytes Relative: 36 % (ref 12–46)
Lymphs Abs: 2.2 10*3/uL (ref 0.7–4.0)
MCH: 30.3 pg (ref 26.0–34.0)
MCHC: 33 g/dL (ref 30.0–36.0)
MCV: 91.7 fL (ref 78.0–100.0)
MPV: 9.9 fL (ref 8.6–12.4)
Monocytes Absolute: 0.5 10*3/uL (ref 0.1–1.0)
Monocytes Relative: 8 % (ref 3–12)
Neutro Abs: 3.1 10*3/uL (ref 1.7–7.7)
Neutrophils Relative %: 52 % (ref 43–77)
PLATELETS: 291 10*3/uL (ref 150–400)
RBC: 4.56 MIL/uL (ref 3.87–5.11)
RDW: 13.4 % (ref 11.5–15.5)
WBC: 6 10*3/uL (ref 4.0–10.5)

## 2014-10-20 MED ORDER — ATORVASTATIN CALCIUM 10 MG PO TABS: 10.0000 mg | ORAL_TABLET | Freq: Every day | ORAL | Status: DC

## 2014-10-20 MED ORDER — DESLORATADINE 5 MG PO TABS: ORAL_TABLET | ORAL | Status: DC

## 2014-10-20 MED ORDER — SOLIFENACIN SUCCINATE 10 MG PO TABS: 10.0000 mg | ORAL_TABLET | Freq: Every day | ORAL | Status: DC

## 2014-10-20 NOTE — Progress Notes (Signed)
Complete Physical  Assessment and Plan: 1. Hyperlipidemia -continue medications, check lipids, decrease fatty foods, increase activity.  - Lipid panel - EKG 12-Lead - atorvastatin (LIPITOR) 10 MG tablet; Take 1 tablet (10 mg total) by mouth daily.  Dispense: 90 tablet; Refill: 3  2. Prediabetes Discussed general issues about diabetes pathophysiology and management., Educational material distributed., Suggested low cholesterol diet., Encouraged aerobic exercise., Discussed foot care., Reminded to get yearly retinal exam. - Hemoglobin A1c - Insulin, fasting  3. Obesity Obesity with co morbidities- long discussion about weight loss, diet, and exercise  4. Labile hypertension - continue medications, DASH diet, exercise and monitor at home. Call if greater than 130/80.  - CBC with Differential/Platelet - BASIC METABOLIC PANEL WITH GFR - Hepatic function panel - TSH - Urinalysis, Routine w reflex microscopic (not at [facility]) - Microalbumin / creatinine urine ratio - EKG 12-Lead  5. Need for prophylactic vaccination and inoculation against influenza - Flu vaccine > 3yo with preservative IM (Fluvirin Influenza Split)  6. Allergy, subsequent encounter - desloratadine (CLARINEX) 5 MG tablet; TAKE 1 TABLET DAILY (FOR ALLERGIES)  Dispense: 90 tablet; Refill: 99  7. Encounter for general adult medical examination with abnormal findings DUE COLONOSCOPY - CBC with Differential/Platelet - BASIC METABOLIC PANEL WITH GFR - Hepatic function panel - TSH - Lipid panel - Hemoglobin A1c - Insulin, fasting - Magnesium - Vit D  25 hydroxy (rtn osteoporosis monitoring) - Urinalysis, Routine w reflex microscopic (not at [facility]) - Microalbumin / creatinine urine ratio - EKG 12-Lead - HIV antibody - Hepatitis C antibody - solifenacin (VESICARE) 10 MG tablet; Take 1 tablet (10 mg total) by mouth daily.  Dispense: 90 tablet; Refill: 3 - desloratadine (CLARINEX) 5 MG tablet; TAKE 1 TABLET DAILY (FOR  ALLERGIES)  Dispense: 90 tablet; Refill: 99 - atorvastatin (LIPITOR) 10 MG tablet; Take 1 tablet (10 mg total) by mouth daily.  Dispense: 90 tablet; Refill: 3  8. Screening for viral disease - HIV antibody - Hepatitis C antibody  9. Medication management - Magnesium  10. Vitamin D deficiency - Vit D  25 hydroxy (rtn osteoporosis monitoring)  11. Incontinence - solifenacin (VESICARE) 10 MG tablet; Take 1 tablet (10 mg total) by mouth daily.  Dispense: 90 tablet; Refill: 3   Discussed med's effects and SE's. Screening labs and tests as requested with regular follow-up as recommended.  HPI 59 y.o. female  presents for a complete physical.  Her blood pressure has been controlled at home, today their BP is BP: 128/70 mmHg She does workout, just started to walk . She denies chest pain, shortness of breath, dizziness.  She is on cholesterol medication and denies myalgias. Her cholesterol is at goal. The cholesterol last visit was:   Lab Results  Component Value Date   CHOL 177 09/22/2013   HDL 47 09/22/2013   LDLCALC 93 09/22/2013   TRIG 185* 09/22/2013   CHOLHDL 3.8 09/22/2013    She has been working on diet and exercise for prediabetes, and denies paresthesia of the feet, polydipsia and polyuria. Last A1C in the office was:  Lab Results  Component Value Date   HGBA1C 6.1* 09/22/2013   Patient is on Vitamin D supplement.   Lab Results  Component Value Date   VD25OH 49 09/22/2013   BMI is Body mass index is 32.92 kg/(m^2)., She is struggling with weight loss due to stress (at work), time limitations. She wants to try weight watchers.  Wt Readings from Last 3 Encounters:  10/20/14 223 lb (  101.152 kg)  07/23/14 222 lb 9.6 oz (100.971 kg)  09/22/13 222 lb (100.699 kg)  She has incontinence and takes vesicare which helps.  She works at [employer], doing accounts payable, has 3 kids, 5 grandsons and 1 grand daughter and another one on the way next month.   Current Medications:   Current Outpatient Prescriptions on File Prior to Visit  Medication Sig Dispense Refill  . atorvastatin (LIPITOR) 10 MG tablet Take 1 tablet (10 mg total) by mouth daily. Cancel Rx 0.1 tablet 0  . BABY ASPIRIN PO Take 81 mg by mouth daily.    . Calcium-Vitamin D (CALTRATE 600 PLUS-VIT D PO) Take by mouth daily.    Marland Kitchen desloratadine (CLARINEX) 5 MG tablet TAKE 1 TABLET DAILY (FOR ALLERGIES) 90 tablet 99  . fluconazole (DIFLUCAN) 150 MG tablet Take 1 tablet (150 mg total) by mouth daily. 1 tablet 3  . Multiple Vitamins-Minerals (MULTIVITAMIN PO) Take by mouth daily.    . Omega-3 Fatty Acids (FISH OIL PO) Take by mouth daily.    . solifenacin (VESICARE) 10 MG tablet Take 1 tablet (10 mg total) by mouth daily. 90 tablet 0   No current facility-administered medications on file prior to visit.   Health Maintenance:   Immunization History  Administered Date(s) Administered  . Tdap 07/13/2009   Tetanus: 2011 Pneumovax: 1997 Prevnar 13: due age 72 Flu vaccine: TODAY Zostavax: N/A Pap: 2011 declines had hysterectomy MGM: 06/2014 CAT B DEXA: N/A Colonoscopy: 2006 due 2016 EGD: N/A  Patient Care Team: [name], MD as PCP - General (Internal Medicine) Inda Castle, MD as Consulting Physician (Gastroenterology) [name], MD as Consulting Physician (Orthopedic Surgery) [name], MD as Consulting Physician (Dermatology)- June/2016 [name], eye doctor - Feb 2016, wears glasses Dentiest q 6 months  Allergies: No Known Allergies Medical History:  Past Medical History  Diagnosis Date  . Hyperlipidemia   . Allergy    Surgical History:  Past Surgical History  Procedure Laterality Date  . Tubal ligation Bilateral 1986    BTL  . Abdominal hysterectomy  1990   Family History:  Family History  Problem Relation Age of Onset  . Arthritis Mother   . Hyperlipidemia Mother   . Hypertension Mother   . Hypertension Father   . Diabetes Father   .  Hyperlipidemia Father   . Arthritis Father    Social History:  Social History  Substance Use Topics  . Smoking status: Never Smoker   . Smokeless tobacco: Never Used  . Alcohol Use: No   Review of Systems  Constitutional: Negative.   HENT: Negative.   Eyes: Negative.   Respiratory: Negative.   Cardiovascular: Negative.   Gastrointestinal: Negative.   Genitourinary: Negative.   Musculoskeletal: Negative.   Skin: Negative.   Neurological: Negative.   Endo/Heme/Allergies: Negative.   Psychiatric/Behavioral: Negative.      Physical Exam: Estimated body mass index is 32.92 kg/(m^2) as calculated from the following:   Height as of this encounter: 5\' 9"  (1.753 m).   Weight as of this encounter: 223 lb (101.152 kg). BP 128/70 mmHg  Pulse 74  Temp(Src) 97.3 F (36.3 C) (Temporal)  Resp 14  Ht 5\' 9"  (1.753 m)  Wt 223 lb (101.152 kg)  BMI 32.92 kg/m2  SpO2 96% General Appearance: Well nourished, in no apparent distress. Eyes: PERRLA, EOMs, conjunctiva no swelling or erythema, normal fundi and vessels. Sinuses: No Frontal/maxillary tenderness ENT/Mouth: Ext aud canals clear, normal light reflex with TMs  without erythema, bulging.  Good dentition. No erythema, swelling, or exudate on post pharynx. Tonsils not swollen or erythematous. Hearing normal.  Neck: Supple, thyroid normal. No bruits Respiratory: Respiratory effort normal, BS equal bilaterally without rales, rhonchi, wheezing or stridor. Cardio: RRR without murmurs, rubs or gallops. Brisk peripheral pulses without edema.  Chest: symmetric, with normal excursions and percussion. Breasts: declines Abdomen: Soft, +BS. Non tender, no guarding, rebound, hernias, masses, or organomegaly. .  Lymphatics: Non tender without lymphadenopathy.  Genitourinary: defer Musculoskeletal: Full ROM all peripheral extremities,5/5 strength, and normal gait. Skin: Warm, dry without rashes, lesions, ecchymosis.  Neuro: Cranial nerves intact,  reflexes equal bilaterally. Normal muscle tone, no cerebellar symptoms. Sensation intact.  Psych: Awake and oriented X 3, normal affect, Insight and Judgment appropriate.   EKG: WNL no changes. AORTA SCAN: defer   Vicie Mutters 10:23 AM

## 2014-10-20 NOTE — Patient Instructions (Signed)

## 2014-10-21 LAB — BASIC METABOLIC PANEL WITH GFR
BUN: 18 mg/dL (ref 7–25)
CO2: 27 mmol/L (ref 20–31)
Calcium: 9.2 mg/dL (ref 8.6–10.4)
Chloride: 103 mmol/L (ref 98–110)
Creat: 0.85 mg/dL (ref 0.50–0.99)
GFR, EST NON AFRICAN AMERICAN: 74 mL/min (ref >=60)
GFR, Est African American: 86 mL/min (ref >=60)
GLUCOSE: 86 mg/dL (ref 65–99)
POTASSIUM: 4.6 mmol/L (ref 3.5–5.3)
Sodium: 140 mmol/L (ref 135–146)

## 2014-10-21 LAB — URINALYSIS, ROUTINE W REFLEX MICROSCOPIC
BILIRUBIN URINE: NEGATIVE
Glucose, UA: NEGATIVE
Hgb urine dipstick: NEGATIVE
KETONES UR: NEGATIVE
Leukocytes, UA: NEGATIVE
NITRITE: NEGATIVE
PROTEIN: NEGATIVE
Specific Gravity, Urine: 1.011 (ref 1.001–1.035)
pH: 6 (ref 5.0–8.0)

## 2014-10-21 LAB — HEMOGLOBIN A1C
Hgb A1c MFr Bld: 6 % — ABNORMAL HIGH (ref <5.7)
MEAN PLASMA GLUCOSE: 126 mg/dL — AB (ref <117)

## 2014-10-21 LAB — HEPATIC FUNCTION PANEL
ALK PHOS: 63 U/L (ref 33–130)
ALT: 24 U/L (ref 6–29)
AST: 24 U/L (ref 10–35)
Albumin: 4.4 g/dL (ref 3.6–5.1)
BILIRUBIN DIRECT: 0.1 mg/dL (ref <=0.2)
BILIRUBIN INDIRECT: 0.4 mg/dL (ref 0.2–1.2)
TOTAL PROTEIN: 7.2 g/dL (ref 6.1–8.1)
Total Bilirubin: 0.5 mg/dL (ref 0.2–1.2)

## 2014-10-21 LAB — HEPATITIS C ANTIBODY: HCV Ab: NEGATIVE

## 2014-10-21 LAB — LIPID PANEL
CHOL/HDL RATIO: 4.1 ratio (ref <=5.0)
CHOLESTEROL: 184 mg/dL (ref 125–200)
HDL: 45 mg/dL — ABNORMAL LOW (ref >=46)
LDL Cholesterol: 102 mg/dL (ref <130)
TRIGLYCERIDES: 185 mg/dL — AB (ref <150)
VLDL: 37 mg/dL — ABNORMAL HIGH (ref <30)

## 2014-10-21 LAB — HIV ANTIBODY (ROUTINE TESTING W REFLEX): HIV 1&2 Ab, 4th Generation: NONREACTIVE

## 2014-10-21 LAB — INSULIN, FASTING: INSULIN FASTING, SERUM: 7.2 u[IU]/mL (ref 2.0–19.6)

## 2014-10-21 LAB — MICROALBUMIN / CREATININE URINE RATIO: CREATININE, URINE: 45 mg/dL (ref 20–320)

## 2014-10-21 LAB — VITAMIN D 25 HYDROXY (VIT D DEFICIENCY, FRACTURES): VIT D 25 HYDROXY: 42 ng/mL (ref 30–100)

## 2014-10-21 LAB — MAGNESIUM: Magnesium: 2.2 mg/dL (ref 1.5–2.5)

## 2014-10-21 LAB — TSH: TSH: 1.363 u[IU]/mL (ref 0.350–4.500)

## 2014-11-05 ENCOUNTER — Other Ambulatory Visit: Payer: Self-pay | Admitting: Internal Medicine

## 2014-11-09 ENCOUNTER — Other Ambulatory Visit: Payer: Self-pay | Admitting: *Deleted

## 2014-11-09 DIAGNOSIS — Z0001 Encounter for general adult medical examination with abnormal findings: Secondary | ICD-10-CM

## 2014-11-09 DIAGNOSIS — E785 Hyperlipidemia, unspecified: Secondary | ICD-10-CM

## 2014-11-09 DIAGNOSIS — R32 Unspecified urinary incontinence: Secondary | ICD-10-CM

## 2014-11-09 DIAGNOSIS — E783 Hyperchylomicronemia: Secondary | ICD-10-CM

## 2014-11-09 MED ORDER — SOLIFENACIN SUCCINATE 10 MG PO TABS: 10.0000 mg | ORAL_TABLET | Freq: Every day | ORAL | Status: DC

## 2014-11-09 MED ORDER — ATORVASTATIN CALCIUM 10 MG PO TABS: 10.0000 mg | ORAL_TABLET | Freq: Every day | ORAL | Status: DC

## 2014-11-09 MED ORDER — DESLORATADINE 5 MG PO TABS: ORAL_TABLET | ORAL | Status: DC

## 2015-04-26 ENCOUNTER — Ambulatory Visit (INDEPENDENT_AMBULATORY_CARE_PROVIDER_SITE_OTHER): Payer: 59 | Admitting: Physician Assistant

## 2015-04-26 ENCOUNTER — Encounter: Payer: Self-pay | Admitting: Physician Assistant

## 2015-04-26 VITALS — BP 120/80 | HR 77 | Temp 97.5°F | Resp 16 | Ht 69.0 in | Wt 224.2 lb

## 2015-04-26 DIAGNOSIS — R0989 Other specified symptoms and signs involving the circulatory and respiratory systems: Secondary | ICD-10-CM

## 2015-04-26 DIAGNOSIS — E559 Vitamin D deficiency, unspecified: Secondary | ICD-10-CM | POA: Diagnosis not present

## 2015-04-26 DIAGNOSIS — E785 Hyperlipidemia, unspecified: Secondary | ICD-10-CM | POA: Diagnosis not present

## 2015-04-26 DIAGNOSIS — E669 Obesity, unspecified: Secondary | ICD-10-CM | POA: Diagnosis not present

## 2015-04-26 DIAGNOSIS — Z79899 Other long term (current) drug therapy: Secondary | ICD-10-CM

## 2015-04-26 DIAGNOSIS — I10 Essential (primary) hypertension: Secondary | ICD-10-CM | POA: Diagnosis not present

## 2015-04-26 DIAGNOSIS — R7303 Prediabetes: Secondary | ICD-10-CM

## 2015-04-26 LAB — HEMOGLOBIN A1C
Hgb A1c MFr Bld: 5.7 % — ABNORMAL HIGH (ref <5.7)
Mean Plasma Glucose: 117 mg/dL

## 2015-04-26 LAB — BASIC METABOLIC PANEL WITH GFR
BUN: 20 mg/dL (ref 7–25)
CALCIUM: 9.4 mg/dL (ref 8.6–10.4)
CO2: 26 mmol/L (ref 20–31)
CREATININE: 0.81 mg/dL (ref 0.50–0.99)
Chloride: 106 mmol/L (ref 98–110)
GFR, Est Non African American: 79 mL/min (ref >=60)
GLUCOSE: 97 mg/dL (ref 65–99)
POTASSIUM: 4.7 mmol/L (ref 3.5–5.3)
Sodium: 137 mmol/L (ref 135–146)

## 2015-04-26 LAB — HEPATIC FUNCTION PANEL
ALBUMIN: 4.4 g/dL (ref 3.6–5.1)
ALT: 27 U/L (ref 6–29)
AST: 22 U/L (ref 10–35)
Alkaline Phosphatase: 69 U/L (ref 33–130)
BILIRUBIN TOTAL: 0.5 mg/dL (ref 0.2–1.2)
Bilirubin, Direct: 0.1 mg/dL (ref <=0.2)
Indirect Bilirubin: 0.4 mg/dL (ref 0.2–1.2)
TOTAL PROTEIN: 6.9 g/dL (ref 6.1–8.1)

## 2015-04-26 LAB — CBC WITH DIFFERENTIAL/PLATELET
BASOS PCT: 1 %
Basophils Absolute: 58 cells/uL (ref 0–200)
EOS ABS: 174 {cells}/uL (ref 15–500)
Eosinophils Relative: 3 %
HEMATOCRIT: 41.8 % (ref 35.0–45.0)
Hemoglobin: 14 g/dL (ref 11.7–15.5)
LYMPHS PCT: 29 %
Lymphs Abs: 1682 cells/uL (ref 850–3900)
MCH: 30.6 pg (ref 27.0–33.0)
MCHC: 33.5 g/dL (ref 32.0–36.0)
MCV: 91.3 fL (ref 80.0–100.0)
MPV: 9.8 fL (ref 7.5–12.5)
Monocytes Absolute: 406 cells/uL (ref 200–950)
Monocytes Relative: 7 %
NEUTROS PCT: 60 %
Neutro Abs: 3480 cells/uL (ref 1500–7800)
PLATELETS: 308 10*3/uL (ref 140–400)
RBC: 4.58 MIL/uL (ref 3.80–5.10)
RDW: 13.6 % (ref 11.0–15.0)
WBC: 5.8 10*3/uL (ref 3.8–10.8)

## 2015-04-26 LAB — LIPID PANEL
Cholesterol: 170 mg/dL (ref 125–200)
HDL: 42 mg/dL — AB (ref >=46)
LDL CALC: 98 mg/dL (ref <130)
TRIGLYCERIDES: 149 mg/dL (ref <150)
Total CHOL/HDL Ratio: 4 Ratio (ref <=5.0)
VLDL: 30 mg/dL (ref <30)

## 2015-04-26 LAB — MAGNESIUM: MAGNESIUM: 2.2 mg/dL (ref 1.5–2.5)

## 2015-04-26 LAB — TSH: TSH: 1.19 m[IU]/L

## 2015-04-26 MED ORDER — MYRBETRIQ 50 MG PO TB24: 50.0000 mg | ORAL_TABLET | Freq: Every day | ORAL | Status: DC

## 2015-04-26 MED ORDER — MIRABEGRON ER 50 MG PO TB24: 50.0000 mg | ORAL_TABLET | Freq: Every day | ORAL | Status: DC

## 2015-04-26 NOTE — Progress Notes (Signed)
Assessment and Plan:  1. Hypertension -Continue medication, monitor blood pressure at home. Continue DASH diet.  Reminder to go to the ER if any CP, SOB, nausea, dizziness, severe HA, changes vision/speech, left arm numbness and tingling and jaw pain.  2. Cholesterol -Continue diet and exercise. Check cholesterol.   3. Prediabetes  -Continue diet and exercise. Check A1C  4. Vitamin D Def - check level and continue medications.   5. Incontinence Will try myrebetriq  Continue diet and meds as discussed. Further disposition pending results of labs. Over 30 minutes of exam, counseling, chart review, and critical decision making was performed Future Appointments Date Time Provider [place]  10/20/2015 10:00 AM Vicie Mutters, PA-C [id number] None    HPI 60 y.o. female  presents for 6 month follow up on hypertension, cholesterol, prediabetes, and vitamin D deficiency.   Her blood pressure has been controlled at home, today their BP is BP: 120/80 mmHg  She does workout. She denies chest pain, shortness of breath, dizziness.  She is on cholesterol medication and denies myalgias. Her cholesterol is at goal. The cholesterol last visit was:   Lab Results  Component Value Date   CHOL 184 10/20/2014   HDL 45* 10/20/2014   LDLCALC 102 10/20/2014   TRIG 185* 10/20/2014   CHOLHDL 4.1 10/20/2014    She has been working on diet and exercise for prediabetes, and denies paresthesia of the feet, polydipsia, polyuria and visual disturbances. Last A1C in the office was:  Lab Results  Component Value Date   HGBA1C 6.0* 10/20/2014   Patient is on Vitamin D supplement.   Lab Results  Component Value Date   VD25OH 42 10/20/2014     BMI is Body mass index is 33.09 kg/(m^2)., she is working on diet and exercise. Wt Readings from Last 3 Encounters:  04/26/15 224 lb 3.2 oz (101.696 kg)  10/20/14 223 lb (101.152 kg)  07/23/14 222 lb 9.6 oz (100.971 kg)    Current Medications:  Current  Outpatient Prescriptions on File Prior to Visit  Medication Sig Dispense Refill  . atorvastatin (LIPITOR) 10 MG tablet Take 1 tablet (10 mg total) by mouth daily. 90 tablet 3  . BABY ASPIRIN PO Take 81 mg by mouth daily.    . Calcium-Vitamin D (CALTRATE 600 PLUS-VIT D PO) Take by mouth daily.    Marland Kitchen desloratadine (CLARINEX) 5 MG tablet TAKE 1 TABLET DAILY (FOR ALLERGIES) 90 tablet 3  . Multiple Vitamins-Minerals (MULTIVITAMIN PO) Take by mouth daily.    . Omega-3 Fatty Acids (FISH OIL PO) Take by mouth daily.    . solifenacin (VESICARE) 10 MG tablet Take 1 tablet (10 mg total) by mouth daily. 90 tablet 3   No current facility-administered medications on file prior to visit.   Medical History:  Past Medical History  Diagnosis Date  . Hyperlipidemia   . Allergy    Allergies: No Known Allergies   Review of Systems:  Review of Systems  Constitutional: Negative.   HENT: Negative.   Eyes: Negative.   Respiratory: Negative.   Cardiovascular: Negative.   Gastrointestinal: Negative.   Genitourinary: Negative.   Musculoskeletal: Negative.   Skin: Negative.   Neurological: Negative.   Endo/Heme/Allergies: Negative.   Psychiatric/Behavioral: Negative.     Family history- Review and unchanged Social history- Review and unchanged Physical Exam: BP 120/80 mmHg  Pulse 77  Temp(Src) 97.5 F (36.4 C) (Temporal)  Resp 16  Ht 5\' 9"  (1.753 m)  Wt 224 lb 3.2 oz (101.696  kg)  BMI 33.09 kg/m2  SpO2 96% Wt Readings from Last 3 Encounters:  04/26/15 224 lb 3.2 oz (101.696 kg)  10/20/14 223 lb (101.152 kg)  07/23/14 222 lb 9.6 oz (100.971 kg)   General Appearance: Well nourished, in no apparent distress. Eyes: PERRLA, EOMs, conjunctiva no swelling or erythema Sinuses: No Frontal/maxillary tenderness ENT/Mouth: Ext aud canals clear, TMs without erythema, bulging. No erythema, swelling, or exudate on post pharynx.  Tonsils not swollen or erythematous. Hearing normal.  Neck: Supple, thyroid  normal.  Respiratory: Respiratory effort normal, BS equal bilaterally without rales, rhonchi, wheezing or stridor.  Cardio: RRR with no MRGs. Brisk peripheral pulses without edema.  Abdomen: Soft, + BS,  Non tender, no guarding, rebound, hernias, masses. Lymphatics: Non tender without lymphadenopathy.  Musculoskeletal: Full ROM, 5/5 strength, Normal gait Skin: Warm, dry without rashes, lesions, ecchymosis.  Neuro: Cranial nerves intact. Normal muscle tone, no cerebellar symptoms. Psych: Awake and oriented X 3, normal affect, Insight and Judgment appropriate.    Vicie Mutters, PA-C 9:06 AM [facility]

## 2015-04-27 LAB — VITAMIN D 25 HYDROXY (VIT D DEFICIENCY, FRACTURES): VIT D 25 HYDROXY: 44 ng/mL (ref 30–100)

## 2015-10-11 ENCOUNTER — Other Ambulatory Visit: Payer: Self-pay | Admitting: Internal Medicine

## 2015-10-11 DIAGNOSIS — Z1231 Encounter for screening mammogram for malignant neoplasm of breast: Secondary | ICD-10-CM

## 2015-10-14 ENCOUNTER — Ambulatory Visit
Admission: RE | Admit: 2015-10-14 | Discharge: 2015-10-14 | Disposition: A | Discharge status: Home / Self Care | Payer: 59 | Source: Ambulatory Visit | Attending: Internal Medicine | Admitting: Internal Medicine

## 2015-10-14 DIAGNOSIS — Z1231 Encounter for screening mammogram for malignant neoplasm of breast: Secondary | ICD-10-CM

## 2015-10-20 ENCOUNTER — Encounter: Payer: Self-pay | Admitting: Physician Assistant

## 2015-10-20 ENCOUNTER — Ambulatory Visit (INDEPENDENT_AMBULATORY_CARE_PROVIDER_SITE_OTHER): Payer: 59 | Admitting: Physician Assistant

## 2015-10-20 VITALS — BP 124/68 | HR 87 | Temp 97.4°F | Resp 14 | Ht 69.0 in | Wt 227.0 lb

## 2015-10-20 DIAGNOSIS — Z0001 Encounter for general adult medical examination with abnormal findings: Secondary | ICD-10-CM

## 2015-10-20 DIAGNOSIS — Z6832 Body mass index (BMI) 32.0-32.9, adult: Secondary | ICD-10-CM

## 2015-10-20 DIAGNOSIS — I10 Essential (primary) hypertension: Secondary | ICD-10-CM | POA: Diagnosis not present

## 2015-10-20 DIAGNOSIS — E785 Hyperlipidemia, unspecified: Secondary | ICD-10-CM

## 2015-10-20 DIAGNOSIS — Z136 Encounter for screening for cardiovascular disorders: Secondary | ICD-10-CM | POA: Diagnosis not present

## 2015-10-20 DIAGNOSIS — R7303 Prediabetes: Secondary | ICD-10-CM

## 2015-10-20 DIAGNOSIS — Z0000 Encounter for general adult medical examination without abnormal findings: Secondary | ICD-10-CM

## 2015-10-20 DIAGNOSIS — R0989 Other specified symptoms and signs involving the circulatory and respiratory systems: Secondary | ICD-10-CM

## 2015-10-20 DIAGNOSIS — M25562 Pain in left knee: Secondary | ICD-10-CM

## 2015-10-20 DIAGNOSIS — E559 Vitamin D deficiency, unspecified: Secondary | ICD-10-CM

## 2015-10-20 DIAGNOSIS — D649 Anemia, unspecified: Secondary | ICD-10-CM

## 2015-10-20 DIAGNOSIS — Z23 Encounter for immunization: Secondary | ICD-10-CM

## 2015-10-20 DIAGNOSIS — E6609 Other obesity due to excess calories: Secondary | ICD-10-CM

## 2015-10-20 DIAGNOSIS — Z79899 Other long term (current) drug therapy: Secondary | ICD-10-CM

## 2015-10-20 DIAGNOSIS — N3946 Mixed incontinence: Secondary | ICD-10-CM

## 2015-10-20 LAB — BASIC METABOLIC PANEL WITH GFR
BUN: 18 mg/dL (ref 7–25)
CO2: 25 mmol/L (ref 20–31)
CREATININE: 0.9 mg/dL (ref 0.50–0.99)
Calcium: 9.4 mg/dL (ref 8.6–10.4)
Chloride: 105 mmol/L (ref 98–110)
GFR, EST AFRICAN AMERICAN: 79 mL/min (ref >=60)
GFR, Est Non African American: 69 mL/min (ref >=60)
GLUCOSE: 85 mg/dL (ref 65–99)
Potassium: 4.6 mmol/L (ref 3.5–5.3)
Sodium: 139 mmol/L (ref 135–146)

## 2015-10-20 LAB — CBC WITH DIFFERENTIAL/PLATELET
BASOS ABS: 57 {cells}/uL (ref 0–200)
Basophils Relative: 1 %
EOS ABS: 171 {cells}/uL (ref 15–500)
Eosinophils Relative: 3 %
HEMATOCRIT: 41.4 % (ref 35.0–45.0)
Hemoglobin: 13.9 g/dL (ref 11.7–15.5)
LYMPHS PCT: 32 %
Lymphs Abs: 1824 cells/uL (ref 850–3900)
MCH: 31.4 pg (ref 27.0–33.0)
MCHC: 33.6 g/dL (ref 32.0–36.0)
MCV: 93.7 fL (ref 80.0–100.0)
MONO ABS: 513 {cells}/uL (ref 200–950)
MONOS PCT: 9 %
MPV: 9.6 fL (ref 7.5–12.5)
NEUTROS PCT: 55 %
Neutro Abs: 3135 cells/uL (ref 1500–7800)
PLATELETS: 283 10*3/uL (ref 140–400)
RBC: 4.42 MIL/uL (ref 3.80–5.10)
RDW: 13.1 % (ref 11.0–15.0)
WBC: 5.7 10*3/uL (ref 3.8–10.8)

## 2015-10-20 LAB — FERRITIN: FERRITIN: 84 ng/mL (ref 20–288)

## 2015-10-20 LAB — IRON AND TIBC
%SAT: 29 % (ref 11–50)
Iron: 103 ug/dL (ref 45–160)
TIBC: 354 ug/dL (ref 250–450)
UIBC: 251 ug/dL (ref 125–400)

## 2015-10-20 LAB — LIPID PANEL
Cholesterol: 189 mg/dL (ref 125–200)
HDL: 44 mg/dL — AB (ref >=46)
LDL CALC: 112 mg/dL (ref <130)
TRIGLYCERIDES: 166 mg/dL — AB (ref <150)
Total CHOL/HDL Ratio: 4.3 Ratio (ref <=5.0)
VLDL: 33 mg/dL — ABNORMAL HIGH (ref <30)

## 2015-10-20 LAB — HEPATIC FUNCTION PANEL
ALBUMIN: 4.2 g/dL (ref 3.6–5.1)
ALK PHOS: 62 U/L (ref 33–130)
ALT: 22 U/L (ref 6–29)
AST: 23 U/L (ref 10–35)
BILIRUBIN TOTAL: 0.5 mg/dL (ref 0.2–1.2)
Bilirubin, Direct: 0.1 mg/dL (ref <=0.2)
Indirect Bilirubin: 0.4 mg/dL (ref 0.2–1.2)
TOTAL PROTEIN: 7.1 g/dL (ref 6.1–8.1)

## 2015-10-20 LAB — TSH: TSH: 1.66 m[IU]/L

## 2015-10-20 LAB — VITAMIN B12: VITAMIN B 12: 1984 pg/mL — AB (ref 200–1100)

## 2015-10-20 LAB — MAGNESIUM: MAGNESIUM: 2.1 mg/dL (ref 1.5–2.5)

## 2015-10-20 NOTE — Progress Notes (Signed)
Complete Physical  Assessment and Plan: Hyperlipidemia -continue medications, check lipids, decrease fatty foods, increase activity.  - Lipid panel - EKG 12-Lead - atorvastatin (LIPITOR) 10 MG tablet; Take 1 tablet (10 mg total) by mouth daily.  Dispense: 90 tablet; Refill: 3  Prediabetes Discussed general issues about diabetes pathophysiology and management., Educational material distributed., Suggested low cholesterol diet., Encouraged aerobic exercise., Discussed foot care., Reminded to get yearly retinal exam. - Hemoglobin A1c - Insulin, fasting   Obesity Obesity with co morbidities- long discussion about weight loss, diet, and exercise  Labile hypertension - continue medications, DASH diet, exercise and monitor at home. Call if greater than 130/80.  - CBC with Differential/Platelet - BASIC METABOLIC PANEL WITH GFR - Hepatic function panel - TSH - Urinalysis, Routine w reflex microscopic (not at [facility]) - Microalbumin / creatinine urine ratio - EKG 12-Lead  Need for prophylactic vaccination and inoculation against influenza - Flu vaccine > 3yo with preservative IM (Fluvirin Influenza Split)  Allergy, subsequent encounter - desloratadine (CLARINEX) 5 MG tablet; TAKE 1 TABLET DAILY (FOR ALLERGIES)  Dispense: 90 tablet; Refill: 99  Encounter for general adult medical examination with abnormal findings DUE COLONOSCOPY - CBC with Differential/Platelet - BASIC METABOLIC PANEL WITH GFR - Hepatic function panel - TSH - Lipid panel - Hemoglobin A1c - Insulin, fasting - Magnesium - Vit D  25 hydroxy (rtn osteoporosis monitoring) - Urinalysis, Routine w reflex microscopic (not at [facility]) - Microalbumin / creatinine urine ratio - EKG 12-Lead  Medication management - Magnesium   Vitamin D deficiency - Vit D  25 hydroxy (rtn osteoporosis monitoring)   Incontinence - solifenacin (VESICARE) 10 MG tablet; Take 1 tablet (10 mg total) by mouth daily.  Dispense: 90 tablet; Refill:  3   Discussed med's effects and SE's. Screening labs and tests as requested with regular follow-up as recommended. Future Appointments Date Time Provider [place]  10/24/2016 10:00 AM Vicie Mutters, PA-C [id number] None    HPI 60 y.o. female  presents for a complete physical.  Her blood pressure has been controlled at home, today their BP is BP: 124/68 She does workout, just started to walk . She denies chest pain, shortness of breath, dizziness.  She has had left knee pain x 1 month, medial knee pain, worse with twisting knee/stepping on step, no known injury.  She is on cholesterol medication and denies myalgias. Her cholesterol is at goal. The cholesterol last visit was:   Lab Results  Component Value Date   CHOL 170 04/26/2015   HDL 42 (L) 04/26/2015   LDLCALC 98 04/26/2015   TRIG 149 04/26/2015   CHOLHDL 4.0 04/26/2015    She has been working on diet and exercise for prediabetes, and denies paresthesia of the feet, polydipsia and polyuria. Last A1C in the office was:  Lab Results  Component Value Date   HGBA1C 5.7 (H) 04/26/2015   Patient is on Vitamin D supplement.   Lab Results  Component Value Date   VD25OH 44 04/26/2015   BMI is Body mass index is 33.52 kg/m., She is struggling with weight loss due to stress (at work), time limitations.  Wt Readings from Last 3 Encounters:  10/20/15 227 lb (103 kg)  04/26/15 224 lb 3.2 oz (101.7 kg)  10/20/14 223 lb (101.2 kg)  She has incontinence and takes vesicare which helps, has samples of myrbetriq that she has not tried yet.  She works at [employer], doing accounts payable, has 3 kids, 5 grandsons and 2 grand  daughter. Wants to retire in a year. Mother passed away a year ago at 19.   Current Medications:  Current Outpatient Prescriptions on File Prior to Visit  Medication Sig Dispense Refill  . atorvastatin (LIPITOR) 10 MG tablet Take 1 tablet (10 mg total) by mouth daily. 90 tablet 3  . BABY ASPIRIN PO Take 81 mg  by mouth daily.    . Calcium-Vitamin D (CALTRATE 600 PLUS-VIT D PO) Take by mouth daily.    Marland Kitchen desloratadine (CLARINEX) 5 MG tablet TAKE 1 TABLET DAILY (FOR ALLERGIES) 90 tablet 3  . Multiple Vitamins-Minerals (MULTIVITAMIN PO) Take by mouth daily.    Marland Kitchen MYRBETRIQ 50 MG TB24 tablet Take 1 tablet (50 mg total) by mouth daily. 90 tablet 1  . Omega-3 Fatty Acids (FISH OIL PO) Take by mouth daily.    . solifenacin (VESICARE) 10 MG tablet Take 1 tablet (10 mg total) by mouth daily. 90 tablet 3   No current facility-administered medications on file prior to visit.    Health Maintenance:   Immunization History  Administered Date(s) Administered  . Influenza Split 10/20/2014  . Tdap 07/13/2009   Tetanus: 2011 Pneumovax: 1997 Prevnar 13: due age 63 Flu vaccine: TODAY Zostavax: declines  Pap: 2011 declines had hysterectomy MGM: 10/2015 CAT B DEXA: N/A Colonoscopy: 2006 due 2016 OVER due will call for appointment EGD: N/A MRI brain 2002 MRI cervical spine 2002  Patient Care Team: [name], MD as PCP - General (Internal Medicine) Inda Castle, MD as Consulting Physician (Gastroenterology) [name], MD as Consulting Physician (Orthopedic Surgery) [name], MD as Consulting Physician (Dermatology)- June/2016 [name], eye doctor - Feb 2016, wears glasses [facility] q 6 months  Medical History:  Past Medical History:  Diagnosis Date  . Allergy   . Hyperlipidemia    Allergies No Known Allergies  SURGICAL HISTORY She  has a past surgical history that includes Tubal ligation (Bilateral, 1986) and Abdominal hysterectomy (1990). FAMILY HISTORY Her family history includes Arthritis in her father and mother; Diabetes in her father; Hyperlipidemia in her father and mother; Hypertension in her father and mother. SOCIAL HISTORY She  reports that she has never smoked. She has never used smokeless tobacco. She reports that she does not drink alcohol or use  drugs.   Review of Systems  Constitutional: Negative.   HENT: Negative.   Eyes: Negative.   Respiratory: Negative.   Cardiovascular: Negative.   Gastrointestinal: Negative.   Genitourinary: Negative.   Musculoskeletal: Negative.   Skin: Negative.   Neurological: Negative.   Endo/Heme/Allergies: Negative.   Psychiatric/Behavioral: Negative.      Physical Exam: Estimated body mass index is 33.52 kg/m as calculated from the following:   Height as of this encounter: 5\' 9"  (1.753 m).   Weight as of this encounter: 227 lb (103 kg). BP 124/68   Pulse 87   Temp 97.4 F (36.3 C)   Resp 14   Ht 5\' 9"  (1.753 m)   Wt 227 lb (103 kg)   SpO2 98%   BMI 33.52 kg/m  General Appearance: Well nourished, in no apparent distress. Eyes: PERRLA, EOMs, conjunctiva no swelling or erythema, normal fundi and vessels. Sinuses: No Frontal/maxillary tenderness ENT/Mouth: Ext aud canals clear, normal light reflex with TMs without erythema, bulging.  Good dentition. No erythema, swelling, or exudate on post pharynx. Tonsils not swollen or erythematous. Hearing normal.  Neck: Supple, thyroid normal. No bruits Respiratory: Respiratory effort normal, BS equal bilaterally without rales, rhonchi, wheezing or  stridor. Cardio: RRR without murmurs, rubs or gallops. Brisk peripheral pulses without edema.  Chest: symmetric, with normal excursions and percussion. Breasts: declines Abdomen: Soft, +BS. Non tender, no guarding, rebound, hernias, masses, or organomegaly. .  Lymphatics: Non tender without lymphadenopathy.  Genitourinary: defer Musculoskeletal: Full ROM all peripheral extremities,5/5 strength, and normal gait. Skin: Warm, dry without rashes, lesions, ecchymosis.  Neuro: Cranial nerves intact, reflexes equal bilaterally. Normal muscle tone, no cerebellar symptoms. Sensation intact.  Psych: Awake and oriented X 3, normal affect, Insight and Judgment appropriate.   EKG: WNL no changes. AORTA SCAN:  defer   Vicie Mutters 10:10 AM

## 2015-10-20 NOTE — Patient Instructions (Addendum)
Call [facility] for colonoscopy Phone: [phone]   Meniscus Tear With Phase I Rehab The meniscus is a C-shaped cartilage structure, located in the knee joint between the thigh bone (femur) and the shinbone (tibia). Two menisci are located in each knee joint: the inner and outer meniscus. The meniscus acts as an adapter between the thigh bone and shinbone, allowing them to fit properly together. It also functions as a shock absorber, to reduce the stress placed on the knee joint and to help supply nutrients to the knee joint cartilage. As people age, the meniscus begins to harden and become more vulnerable to injury. Meniscus tears are a common injury, especially in older athletes. Inner meniscus tears are more common than outer meniscus tears.  SYMPTOMS   Pain in the knee, especially with standing or squatting with the affected leg.  Tenderness along the joint line.  Swelling in the knee joint (effusion), usually starting 1 to 2 days after injury.  Locking or catching of the knee joint, causing inability to straighten the knee completely.  Giving way or buckling of the knee. CAUSES  A meniscus tear occurs when a force is placed on the meniscus that is greater than it can handle. Common causes of injury include:  Direct hit (trauma) to the knee.  Twisting, pivoting, or cutting (rapidly changing direction while running), kneeling or squatting.  Without injury, due to aging. RISK INCREASES WITH:  Contact sports (football, rugby).  Sports in which cleats are used with pivoting (soccer, lacrosse) or sports in which good shoe grip and sudden change in direction are required (racquetball, basketball, squash).  Previous knee injury.  Associated knee injury, particularly ligament injuries.  Poor strength and flexibility. PREVENTION  Warm up and stretch properly before activity.  Maintain physical fitness:  Strength, flexibility, and endurance.  Cardiovascular  fitness.  Protect the knee with a brace or elastic bandage.  Wear properly fitted protective equipment (proper cleats for the surface). PROGNOSIS  Sometimes, meniscus tears heal on their own. However, definitive treatment requires surgery, followed by at least 6 weeks of recovery.  RELATED COMPLICATIONS   Recurring symptoms that result in a chronic problem.  Repeated knee injury, especially if sports are resumed too soon after injury or surgery.  Progression of the tear (the tear gets larger), if untreated.  Arthritis of the knee in later years (with or without surgery).  Complications of surgery, including infection, bleeding, injury to nerves (numbness, weakness, paralysis) continued pain, giving way, locking, nonhealing of meniscus (if repaired), need for further surgery, and knee stiffness (loss of motion). TREATMENT  Treatment first involves the use of ice and medicine, to reduce pain and inflammation. You may find using crutches to walk more comfortable. However, it is okay to bear weight on the injured knee, if the pain will allow it. Surgery is often advised as a definitive treatment. Surgery is performed through an incision near the joint (arthroscopically). The torn piece of the meniscus is removed, and if possible the joint cartilage is repaired. After surgery, the joint must be restrained. After restraint, it is important to perform strengthening and stretching exercises to help regain strength and a full range of motion. These exercises may be completed at home or with a therapist.  MEDICATION  If pain medicine is needed, nonsteroidal anti-inflammatory medicines (aspirin and ibuprofen), or other minor pain relievers (acetaminophen), are often advised.  Do not take pain medicine for 7 days before surgery.  Prescription pain relievers may be given, if your  caregiver thinks they are needed. Use only as directed and only as much as you need. HEAT AND COLD  Cold treatment (icing)  should be applied for 10 to 15 minutes every 2 to 3 hours for inflammation and pain, and immediately after activity that aggravates your symptoms. Use ice packs or an ice massage.  Heat treatment may be used before performing stretching and strengthening activities prescribed by your caregiver, physical therapist, or athletic trainer. Use a heat pack or a warm water soak. SEEK MEDICAL CARE IF:   Symptoms get worse or do not improve in 2 weeks, despite treatment.  New, unexplained symptoms develop. (Drugs used in treatment may produce side effects.) EXERCISES RANGE OF MOTION (ROM) AND STRETCHING EXERCISES - Meniscus Tear, Non-operative, Phase I These are some of the initial exercises with which you may start your rehabilitation program, until you see your caregiver again or until your symptoms are resolved. Remember:   These initial exercises are intended to be gentle. They will help you restore motion without increasing any swelling.  Completing these exercises allows less painful movement and prepares you for the more aggressive strengthening exercises in Phase II.  An effective stretch should be held for at least 30 seconds.  A stretch should never be painful. You should only feel a gentle lengthening or release in the stretched tissue. RANGE OF MOTION - Knee Flexion, Active  Lie on your back with both knees straight. (If this causes back discomfort, bend your healthy knee, placing your foot flat on the floor.)  Slowly slide your heel back toward your buttocks until you feel a gentle stretch in the front of your knee or thigh.  Hold for __________ seconds. Slowly slide your heel back to the starting position. Repeat __________ times. Complete this exercise __________ times per day.  RANGE OF MOTION - Knee Flexion and Extension, Active-Assisted  Sit on the edge of a table or chair with your thighs firmly supported. It may be helpful to place a folded towel under the end of your right /  left thigh.  Flexion (bending): Place the ankle of your healthy leg on top of the other ankle. Use your healthy leg to gently bend your right / left knee until you feel a mild tension across the top of your knee.  Hold for __________ seconds.  Extension (straightening): Switch your ankles so your right / left leg is on top. Use your healthy leg to straighten your right / left knee until you feel a mild tension on the backside of your knee.  Hold for __________ seconds. Repeat __________ times. Complete __________ times per day. STRETCH - Knee Flexion, Supine  Lie on the floor with your right / left heel and foot lightly touching the wall. (Place both feet on the wall if you do not use a door frame.)  Without using any effort, allow gravity to slide your foot down the wall slowly until you feel a gentle stretch in the front of your right / left knee.  Hold this stretch for __________ seconds. Then return the leg to the starting position, using your healthy leg for help, if needed. Repeat __________ times. Complete this stretch __________ times per day.  STRETCH - Knee Extension Sitting  Sit with your right / left leg/heel propped on another chair, coffee table, or foot stool.  Allow your leg muscles to relax, letting gravity straighten out your knee.*  You should feel a stretch behind your right / left knee. Hold this position  for __________ seconds. Repeat __________ times. Complete this stretch __________ times per day.  *Your physician, physical therapist or athletic trainer may instruct you place a __________ weight on your thigh, just above your kneecap, to deepen the stretch.  STRENGTHENING EXERCISES - Meniscus Tear, Non-operative, Phase I These exercises may help you when beginning to rehabilitate your injury. They may resolve your symptoms with or without further involvement from your physician, physical therapist or athletic trainer. While completing these exercises, remember:    Muscles can gain both the endurance and the strength needed for everyday activities through controlled exercises.  Complete these exercises as instructed by your physician, physical therapist or athletic trainer. Progress the resistance and repetitions only as guided. STRENGTH - Quadriceps, Isometrics  Lie on your back with your right / left leg extended and your opposite knee bent.  Gradually tense the muscles in the front of your right / left thigh. You should see either your knee cap slide up toward your hip or increased dimpling just above the knee. This motion will push the back of the knee down toward the floor, mat, or bed on which you are lying.  Hold the muscle as tight as you can, without increasing your pain, for __________ seconds.  Relax the muscles slowly and completely between each repetition. Repeat __________ times. Complete this exercise __________ times per day.  STRENGTH - Quadriceps, Short Arcs   Lie on your back. Place a __________ inch towel roll under your right / left knee, so that the knee bends slightly.  Raise only your lower leg by tightening the muscles in the front of your thigh. Do not allow your thigh to rise.  Hold this position for __________ seconds. Repeat __________ times. Complete this exercise __________ times per day.  OPTIONAL ANKLE WEIGHTS: Begin with ____________________, but DO NOT exceed ____________________. Increase in 1 pound/0.5 kilogram increments. STRENGTH - Quadriceps, Straight Leg Raises  Quality counts! Watch for signs that the quadriceps muscle is working, to be sure you are strengthening the correct muscles and not "cheating" by substituting with healthier muscles.  Lay on your back with your right / left leg extended and your opposite knee bent.  Tense the muscles in the front of your right / left thigh. You should see either your knee cap slide up or increased dimpling just above the knee. Your thigh may even shake a  bit.  Tighten these muscles even more and raise your leg 4 to 6 inches off the floor. Hold for __________ seconds.  Keeping these muscles tense, lower your leg.  Relax the muscles slowly and completely in between each repetition. Repeat __________ times. Complete this exercise __________ times per day.  STRENGTH - Hamstring, Curls   Lay on your stomach with your legs extended. (If you lay on a bed, your feet may hang over the edge.)  Tighten the muscles in the back of your thigh to bend your right / left knee up to 90 degrees. Keep your hips flat on the bed.  Hold this position for __________ seconds.  Slowly lower your leg back to the starting position. Repeat __________ times. Complete this exercise __________ times per day.  STRENGTH - Quadriceps, Squats  Stand in a door frame so that your feet and knees are in line with the frame.  Use your hands for balance, not support, on the frame.  Slowly lower your weight, bending at the hips and knees. Keep your lower legs upright so that they are parallel with  the door frame. Squat only within the range that does not increase your knee pain. Never let your hips drop below your knees.  Slowly return upright, pushing with your legs, not pulling with your hands. Repeat __________ times. Complete this exercise __________ times per day.  STRENGTH - Quad/VMO, Isometric   Sit in a chair with your right / left knee slightly bent. With your fingertips, feel the VMO muscle just above the inside of your knee. The VMO is important in controlling the position of your kneecap.  Keeping your fingertips on this muscle. Without actually moving your leg, attempt to drive your knee down as if straightening your leg. You should feel your VMO tense. If you have a difficult time, you may wish to try the same exercise on your healthy knee first.  Tense this muscle as hard as you can without increasing any knee pain.  Hold for __________ seconds. Relax the  muscles slowly and completely in between each repetition. Repeat __________ times. Complete exercise __________ times per day.    This information is not intended to replace advice given to you by your health care provider. Make sure you discuss any questions you have with your health care provider.   Document Released: 01/02/1998 Document Revised: 05/05/2014 Document Reviewed: 04/02/2008 Elsevier Interactive Patient Education [employer].

## 2015-10-21 LAB — URINALYSIS, ROUTINE W REFLEX MICROSCOPIC
BILIRUBIN URINE: NEGATIVE
GLUCOSE, UA: NEGATIVE
HGB URINE DIPSTICK: NEGATIVE
Ketones, ur: NEGATIVE
Nitrite: NEGATIVE
PROTEIN: NEGATIVE
Specific Gravity, Urine: 1.009 (ref 1.001–1.035)
pH: 5.5 (ref 5.0–8.0)

## 2015-10-21 LAB — HEMOGLOBIN A1C
HEMOGLOBIN A1C: 5.6 % (ref <5.7)
Mean Plasma Glucose: 114 mg/dL

## 2015-10-21 LAB — URINALYSIS, MICROSCOPIC ONLY
CRYSTALS: NONE SEEN [HPF]
Casts: NONE SEEN [LPF]
RBC / HPF: NONE SEEN RBC/HPF (ref <=2)
Squamous Epithelial / LPF: NONE SEEN [HPF] (ref <=5)
Yeast: NONE SEEN [HPF]

## 2015-10-21 LAB — VITAMIN D 25 HYDROXY (VIT D DEFICIENCY, FRACTURES): VIT D 25 HYDROXY: 46 ng/mL (ref 30–100)

## 2015-10-21 LAB — MICROALBUMIN / CREATININE URINE RATIO
CREATININE, URINE: 56 mg/dL (ref 20–320)
Microalb Creat Ratio: 7 mcg/mg creat (ref <30)
Microalb, Ur: 0.4 mg/dL

## 2015-11-05 ENCOUNTER — Other Ambulatory Visit: Payer: Self-pay | Admitting: Internal Medicine

## 2015-11-05 DIAGNOSIS — E785 Hyperlipidemia, unspecified: Secondary | ICD-10-CM

## 2015-11-05 DIAGNOSIS — Z0001 Encounter for general adult medical examination with abnormal findings: Secondary | ICD-10-CM

## 2015-11-05 DIAGNOSIS — E783 Hyperchylomicronemia: Secondary | ICD-10-CM

## 2015-11-05 DIAGNOSIS — R32 Unspecified urinary incontinence: Secondary | ICD-10-CM

## 2015-11-11 ENCOUNTER — Other Ambulatory Visit: Payer: Self-pay | Admitting: Internal Medicine

## 2016-02-22 DIAGNOSIS — L920 Granuloma annulare: Secondary | ICD-10-CM | POA: Diagnosis not present

## 2016-04-08 ENCOUNTER — Other Ambulatory Visit: Payer: Self-pay | Admitting: Internal Medicine

## 2016-04-27 ENCOUNTER — Ambulatory Visit: Payer: Self-pay | Admitting: Physician Assistant

## 2016-09-02 ENCOUNTER — Other Ambulatory Visit: Payer: Self-pay | Admitting: Internal Medicine

## 2016-09-02 DIAGNOSIS — E785 Hyperlipidemia, unspecified: Secondary | ICD-10-CM

## 2016-09-02 DIAGNOSIS — E783 Hyperchylomicronemia: Secondary | ICD-10-CM

## 2016-09-02 DIAGNOSIS — Z0001 Encounter for general adult medical examination with abnormal findings: Secondary | ICD-10-CM

## 2016-09-02 DIAGNOSIS — R32 Unspecified urinary incontinence: Secondary | ICD-10-CM

## 2016-10-10 ENCOUNTER — Other Ambulatory Visit: Payer: Self-pay | Admitting: Internal Medicine

## 2016-10-10 DIAGNOSIS — Z1231 Encounter for screening mammogram for malignant neoplasm of breast: Secondary | ICD-10-CM

## 2016-10-23 NOTE — Progress Notes (Signed)
Complete Physical  Assessment and Plan: Hyperlipidemia -continue medications, check lipids, decrease fatty foods, increase activity.  - Lipid panel - EKG 12-Lead - atorvastatin (LIPITOR) 10 MG tablet; Take 1 tablet (10 mg total) by mouth daily.  Dispense: 90 tablet; Refill: 3  Prediabetes Discussed general issues about diabetes pathophysiology and management., Educational material distributed., Suggested low cholesterol diet., Encouraged aerobic exercise., Discussed foot care., Reminded to get yearly retinal exam. - Hemoglobin A1c - Insulin, fasting   Obesity Obesity with co morbidities- long discussion about weight loss, diet, and exercise  Labile hypertension - continue medications, DASH diet, exercise and monitor at home. Call if greater than 130/80.  - CBC with Differential/Platelet - BASIC METABOLIC PANEL WITH GFR - Hepatic function panel - TSH - Urinalysis, Routine w reflex microscopic (not at [facility]) - Microalbumin / creatinine urine ratio - EKG 12-Lead  Need for prophylactic vaccination and inoculation against influenza - Flu vaccine > 3yo with preservative IM (Fluvirin Influenza Split)  Allergy, subsequent encounter Continue OTC meds  Encounter for general adult medical examination with abnormal findings  Medication management - Magnesium   Vitamin D deficiency - Vit D  25 hydroxy (rtn osteoporosis monitoring)   Incontinence Continue meds   Discussed med's effects and SE's. Screening labs and tests as requested with regular follow-up as recommended. Future Appointments Date Time Provider [place]  10/25/2016 3:40 PM [facility] MM 2 [facility]  10/31/2017 10:00 AM Vicie Mutters, PA-C [facility] None    HPI 61 y.o. female  presents for a complete physical.  Her blood pressure has been controlled at home, today their BP is BP: 126/74 She does workout, just started to walk . She denies chest pain, shortness of breath, dizziness.  She is  getting over cold, states she is improving, claritin and sudafed.  She is on cholesterol medication and denies myalgias. Her cholesterol is at goal. The cholesterol last visit was:   Lab Results  Component Value Date   CHOL 189 10/20/2015   HDL 44 (L) 10/20/2015   LDLCALC 112 10/20/2015   TRIG 166 (H) 10/20/2015   CHOLHDL 4.3 10/20/2015    She has been working on diet and exercise for prediabetes, and denies paresthesia of the feet, polydipsia and polyuria. Last A1C in the office was:  Lab Results  Component Value Date   HGBA1C 5.6 10/20/2015   Patient is on Vitamin D supplement.   Lab Results  Component Value Date   VD25OH 46 10/20/2015   BMI is Body mass index is 31.03 kg/m. She is working on weight loss.  Wt Readings from Last 3 Encounters:  10/24/16 213 lb 3.2 oz (96.7 kg)  10/20/15 227 lb (103 kg)  04/26/15 224 lb 3.2 oz (101.7 kg)  She has incontinence and takes vesicare which helps, has samples of myrbetriq that she has not tried yet.  She works at [employer], doing accounts payable, has 3 kids, 4 grandsons and 3 grand daughters. Going to retire in 2 months, wants to travel has camper and wants to drive the [place] and [place]. Mother passed away 04-06-2015 at 40.   Current Medications:  Current Outpatient Prescriptions on File Prior to Visit  Medication Sig Dispense Refill  . atorvastatin (LIPITOR) 10 MG tablet TAKE 1 TABLET DAILY 90 tablet 0  . BABY ASPIRIN PO Take 81 mg by mouth daily.    . Calcium-Vitamin D (CALTRATE 600 PLUS-VIT D PO) Take by mouth daily.    Marland Kitchen desloratadine (CLARINEX) 5 MG tablet TAKE  1 TABLET DAILY FOR    ALLERGIES 90 tablet 1  . Multiple Vitamins-Minerals (MULTIVITAMIN PO) Take by mouth daily.    Marland Kitchen MYRBETRIQ 50 MG TB24 tablet Take 1 tablet (50 mg total) by mouth daily. 90 tablet 1  . Omega-3 Fatty Acids (FISH OIL PO) Take by mouth daily.    . VESICARE 10 MG tablet TAKE 1 TABLET DAILY 90 tablet 0   No current facility-administered medications on file prior to  visit.    Health Maintenance:   Immunization History  Administered Date(s) Administered  . Influenza Inj Mdck Quad With Preservative 10/24/2016  . Influenza Split 10/20/2014  . Influenza, Seasonal, Injecte, Preservative Fre 10/20/2015  . Tdap 07/13/2009   Tetanus: 2011 Pneumovax: 1997 Prevnar 13: due age 6 Flu vaccine: TODAY Zostavax: declines  Pap: 2011 declines had hysterectomy MGM: 10/2015 CAT B has scheduled DEXA: N/A Colonoscopy: 2006 due 2016 OVER due will call about cologuard EGD: N/A MRI brain 2002 MRI cervical spine 2002  Patient Care Team: [name], MD as PCP - General (Internal Medicine) [name], MD as Consulting Physician (Orthopedic Surgery) [name], MD as Consulting Physician (Dermatology) [name], [name], MD as Consulting Physician (Gastroenterology)  [name], eye doctor - Feb 2016, wears glasses [facility] q 6 months  Medical History:  Past Medical History:  Diagnosis Date  . Allergy   . Hyperlipidemia    Allergies No Known Allergies  SURGICAL HISTORY She  has a past surgical history that includes Tubal ligation (Bilateral, 1986) and Abdominal hysterectomy (1990). FAMILY HISTORY Her family history includes Arthritis in her father and mother; Diabetes in her father; Hyperlipidemia in her father and mother; Hypertension in her father and mother. SOCIAL HISTORY She  reports that she has never smoked. She has never used smokeless tobacco. She reports that she does not drink alcohol or use drugs.   Review of Systems  Constitutional: Negative.   HENT: Negative.   Eyes: Negative.   Respiratory: Negative.   Cardiovascular: Negative.   Gastrointestinal: Negative.   Genitourinary: Negative.   Musculoskeletal: Negative.   Skin: Negative.   Neurological: Negative.   Endo/Heme/Allergies: Negative.   Psychiatric/Behavioral: Negative.      Physical Exam: Estimated body mass index is 31.03 kg/m as calculated  from the following:   Height as of this encounter: 5' 9.5" (1.765 m).   Weight as of this encounter: 213 lb 3.2 oz (96.7 kg). BP 126/74   Pulse 86   Temp (!) 97.5 F (36.4 C)   Resp 16   Ht 5' 9.5" (1.765 m)   Wt 213 lb 3.2 oz (96.7 kg)   SpO2 97%   BMI 31.03 kg/m  General Appearance: Well nourished, in no apparent distress. Eyes: PERRLA, EOMs, conjunctiva no swelling or erythema, normal fundi and vessels. Sinuses: No Frontal/maxillary tenderness ENT/Mouth: Ext aud canals clear, normal light reflex with TMs without erythema, bulging.  Good dentition. No erythema, swelling, or exudate on post pharynx. Tonsils not swollen or erythematous. Hearing normal.  Neck: Supple, thyroid normal. No bruits Respiratory: Respiratory effort normal, BS equal bilaterally without rales, rhonchi, wheezing or stridor. Cardio: RRR without murmurs, rubs or gallops. Brisk peripheral pulses without edema.  Chest: symmetric, with normal excursions and percussion. Breasts: declines Abdomen: Soft, +BS. Non tender, no guarding, rebound, hernias, masses, or organomegaly. .  Lymphatics: Non tender without lymphadenopathy.  Genitourinary: defer Musculoskeletal: Full ROM all peripheral extremities,5/5 strength, and normal gait. Skin: Warm, dry without rashes, lesions, ecchymosis.  Neuro: Cranial nerves intact,  reflexes equal bilaterally. Normal muscle tone, no cerebellar symptoms. Sensation intact.  Psych: Awake and oriented X 3, normal affect, Insight and Judgment appropriate.   EKG: WNL no changes. AORTA SCAN: defer   Vicie Mutters 10:20 AM

## 2016-10-24 ENCOUNTER — Encounter: Payer: Self-pay | Admitting: Physician Assistant

## 2016-10-24 ENCOUNTER — Ambulatory Visit (INDEPENDENT_AMBULATORY_CARE_PROVIDER_SITE_OTHER): Payer: 59 | Admitting: Physician Assistant

## 2016-10-24 VITALS — BP 126/74 | HR 86 | Temp 97.5°F | Resp 16 | Ht 69.5 in | Wt 213.2 lb

## 2016-10-24 DIAGNOSIS — Z136 Encounter for screening for cardiovascular disorders: Secondary | ICD-10-CM | POA: Diagnosis not present

## 2016-10-24 DIAGNOSIS — E559 Vitamin D deficiency, unspecified: Secondary | ICD-10-CM

## 2016-10-24 DIAGNOSIS — N3946 Mixed incontinence: Secondary | ICD-10-CM

## 2016-10-24 DIAGNOSIS — Z23 Encounter for immunization: Secondary | ICD-10-CM | POA: Diagnosis not present

## 2016-10-24 DIAGNOSIS — E785 Hyperlipidemia, unspecified: Secondary | ICD-10-CM

## 2016-10-24 DIAGNOSIS — I10 Essential (primary) hypertension: Secondary | ICD-10-CM | POA: Diagnosis not present

## 2016-10-24 DIAGNOSIS — Z0000 Encounter for general adult medical examination without abnormal findings: Secondary | ICD-10-CM | POA: Diagnosis not present

## 2016-10-24 DIAGNOSIS — Z79899 Other long term (current) drug therapy: Secondary | ICD-10-CM

## 2016-10-24 DIAGNOSIS — R7303 Prediabetes: Secondary | ICD-10-CM

## 2016-10-24 DIAGNOSIS — Z6831 Body mass index (BMI) 31.0-31.9, adult: Secondary | ICD-10-CM

## 2016-10-24 DIAGNOSIS — R0989 Other specified symptoms and signs involving the circulatory and respiratory systems: Secondary | ICD-10-CM

## 2016-10-24 DIAGNOSIS — Z0001 Encounter for general adult medical examination with abnormal findings: Secondary | ICD-10-CM

## 2016-10-24 DIAGNOSIS — T7840XD Allergy, unspecified, subsequent encounter: Secondary | ICD-10-CM

## 2016-10-24 DIAGNOSIS — D649 Anemia, unspecified: Secondary | ICD-10-CM

## 2016-10-24 NOTE — Patient Instructions (Addendum)
Cologuard is an easy to use noninvasive colon cancer screening test based on the latest advances in stool DNA science.   Colon cancer is 3rd most diagnosed cancer and 2nd leading cause of death in both men and women 62 years of age and older despite being one of the most preventable and treatable cancers if found early.  4 of out 5 people diagnosed with colon cancer have NO prior family history.  When caught EARLY 90% of colon cancer is curable.   You have agreed to do a Cologuard screening and have declined a colonoscopy in spite of being explained the risks and benefits of the colonoscopy in detail, including cancer and death. Please understand that this is test not as sensitive or specific as a colonoscopy and you are still recommended to get a colonoscopy.   If you are NOT medicare please call your insurance company and give them these items to see if they will cover it: 1) CPT code, [phone] 2) Provider is Probation officer 3) [employer] NPI [id number] 4) [employer] ID [phone]  Out-of-pocket cost for Cologuard can range from $0 - $649 so please call  You will receive a short call from [place] support center at [employer], when you receive a call they will say they are from [place],  to confirm your mailing address and give you more information.  When they calll you, it will appear on the caller ID as "[employer]" or in some cases only this number will appear, [phone].   [employer] will ship your collection kit directly to you. You will collect a single stool sample in the privacy of your own home, no special preparation required. You will return the kit via [place] pre-paid shipping or pick-up, in the same box it arrived in. Then I will contact you to discuss your results after I receive them from the laboratory.   If you have any questions or concerns, Cologuard Customer Support Specialist are available 24 hours a  day, 7 days a week at [phone] or go to [email or web address].    IF YOUR INSURANCE DOES NOT COVER COLOGUARD, THROW AWAY THE BOX AND LET us KNOW AND CALL THIS NUMBER TO SET UP A COLONOSCOPY Phone: [phone]      Bad carbs also include fruit juice, alcohol, and sweet tea. These are empty calories that do not signal to your brain that you are full.   Please remember the good carbs are still carbs which convert into sugar. So please measure them out no more than 1/2-1 cup of rice, oatmeal, pasta, and beans  Veggies are however free foods! Pile them on.   Not all fruit is created equal. Please see the list below, the fruit at the bottom is higher in sugars than the fruit at the top. Please avoid all dried fruits.

## 2016-10-25 ENCOUNTER — Ambulatory Visit
Admission: RE | Admit: 2016-10-25 | Discharge: 2016-10-25 | Disposition: A | Discharge status: Home / Self Care | Payer: 59 | Source: Ambulatory Visit | Attending: Internal Medicine | Admitting: Internal Medicine

## 2016-10-25 DIAGNOSIS — Z1231 Encounter for screening mammogram for malignant neoplasm of breast: Secondary | ICD-10-CM | POA: Diagnosis not present

## 2016-10-25 LAB — HEPATIC FUNCTION PANEL
AG RATIO: 1.5 (calc) (ref 1.0–2.5)
ALBUMIN MSPROF: 4.4 g/dL (ref 3.6–5.1)
ALT: 21 U/L (ref 6–29)
AST: 20 U/L (ref 10–35)
Alkaline phosphatase (APISO): 73 U/L (ref 33–130)
BILIRUBIN TOTAL: 0.5 mg/dL (ref 0.2–1.2)
Bilirubin, Direct: 0.1 mg/dL (ref 0.0–0.2)
GLOBULIN: 2.9 g/dL (ref 1.9–3.7)
Indirect Bilirubin: 0.4 mg/dL (calc) (ref 0.2–1.2)
TOTAL PROTEIN: 7.3 g/dL (ref 6.1–8.1)

## 2016-10-25 LAB — CBC WITH DIFFERENTIAL/PLATELET
BASOS ABS: 51 {cells}/uL (ref 0–200)
BASOS PCT: 0.7 %
EOS ABS: 102 {cells}/uL (ref 15–500)
EOS PCT: 1.4 %
HEMATOCRIT: 43.3 % (ref 35.0–45.0)
HEMOGLOBIN: 14.4 g/dL (ref 11.7–15.5)
LYMPHS ABS: 2051 {cells}/uL (ref 850–3900)
MCH: 30.4 pg (ref 27.0–33.0)
MCHC: 33.3 g/dL (ref 32.0–36.0)
MCV: 91.4 fL (ref 80.0–100.0)
MPV: 9.9 fL (ref 7.5–12.5)
Monocytes Relative: 6 %
NEUTROS ABS: 4657 {cells}/uL (ref 1500–7800)
Neutrophils Relative %: 63.8 %
Platelets: 335 10*3/uL (ref 140–400)
RBC: 4.74 10*6/uL (ref 3.80–5.10)
RDW: 11.4 % (ref 11.0–15.0)
Total Lymphocyte: 28.1 %
WBC mixed population: 438 cells/uL (ref 200–950)
WBC: 7.3 10*3/uL (ref 3.8–10.8)

## 2016-10-25 LAB — URINALYSIS, ROUTINE W REFLEX MICROSCOPIC
Bacteria, UA: NONE SEEN /HPF
Bilirubin Urine: NEGATIVE
Glucose, UA: NEGATIVE
HGB URINE DIPSTICK: NEGATIVE
Hyaline Cast: NONE SEEN /LPF
KETONES UR: NEGATIVE
NITRITE: NEGATIVE
Protein, ur: NEGATIVE
RBC / HPF: NONE SEEN /HPF (ref 0–2)
Specific Gravity, Urine: 1.007 (ref 1.001–1.03)
Squamous Epithelial / LPF: NONE SEEN /HPF (ref <=5)
pH: 7.5 (ref 5.0–8.0)

## 2016-10-25 LAB — BASIC METABOLIC PANEL WITH GFR
BUN: 16 mg/dL (ref 7–25)
CO2: 30 mmol/L (ref 20–32)
CREATININE: 0.84 mg/dL (ref 0.50–0.99)
Calcium: 9.8 mg/dL (ref 8.6–10.4)
Chloride: 101 mmol/L (ref 98–110)
GFR, EST NON AFRICAN AMERICAN: 74 mL/min/{1.73_m2} (ref >=60)
GFR, Est African American: 86 mL/min/{1.73_m2} (ref >=60)
Glucose, Bld: 97 mg/dL (ref 65–99)
Potassium: 4.7 mmol/L (ref 3.5–5.3)
Sodium: 140 mmol/L (ref 135–146)

## 2016-10-25 LAB — LIPID PANEL
CHOLESTEROL: 187 mg/dL (ref <200)
HDL: 50 mg/dL — AB (ref >50)
LDL CHOLESTEROL (CALC): 105 mg/dL — AB
NON-HDL CHOLESTEROL (CALC): 137 mg/dL — AB (ref <130)
TRIGLYCERIDES: 197 mg/dL — AB (ref <150)
Total CHOL/HDL Ratio: 3.7 (calc) (ref <5.0)

## 2016-10-25 LAB — VITAMIN D 25 HYDROXY (VIT D DEFICIENCY, FRACTURES): Vit D, 25-Hydroxy: 51 ng/mL (ref 30–100)

## 2016-10-25 LAB — IRON, TOTAL/TOTAL IRON BINDING CAP
%SAT: 35 % (calc) (ref 11–50)
IRON: 132 ug/dL (ref 45–160)
TIBC: 373 ug/dL (ref 250–450)

## 2016-10-25 LAB — HEMOGLOBIN A1C
HEMOGLOBIN A1C: 5.5 %{Hb} (ref <5.7)
MEAN PLASMA GLUCOSE: 111 (calc)
eAG (mmol/L): 6.2 (calc)

## 2016-10-25 LAB — MICROALBUMIN / CREATININE URINE RATIO
Creatinine, Urine: 31 mg/dL (ref 20–275)
Microalb, Ur: <0.2 mg/dL

## 2016-10-25 LAB — VITAMIN B12

## 2016-10-25 LAB — MAGNESIUM: Magnesium: 2.2 mg/dL (ref 1.5–2.5)

## 2016-10-25 LAB — TSH: TSH: 1.42 mIU/L (ref 0.40–4.50)

## 2016-10-25 NOTE — Progress Notes (Signed)
Pt aware of lab results & voiced understanding of those results.

## 2016-11-10 ENCOUNTER — Ambulatory Visit (INDEPENDENT_AMBULATORY_CARE_PROVIDER_SITE_OTHER): Payer: 59 | Admitting: Physician Assistant

## 2016-11-10 ENCOUNTER — Encounter: Payer: Self-pay | Admitting: Physician Assistant

## 2016-11-10 VITALS — BP 132/80 | HR 87 | Temp 97.6°F | Resp 20 | Ht 69.5 in | Wt 217.6 lb

## 2016-11-10 DIAGNOSIS — J0100 Acute maxillary sinusitis, unspecified: Secondary | ICD-10-CM

## 2016-11-10 MED ORDER — AZITHROMYCIN 250 MG PO TABS: ORAL_TABLET | ORAL | 1 refills | Status: AC

## 2016-11-10 MED ORDER — PROMETHAZINE-DM 6.25-15 MG/5ML PO SYRP: 5.0000 mL | ORAL_SOLUTION | Freq: Four times a day (QID) | ORAL | 1 refills | Status: DC | PRN

## 2016-11-10 MED ORDER — PREDNISONE 20 MG PO TABS: ORAL_TABLET | ORAL | 0 refills | Status: DC

## 2016-11-10 NOTE — Patient Instructions (Signed)

## 2016-11-10 NOTE — Progress Notes (Signed)
   Subjective:    Patient ID: Kristina Hunter, female    DOB: 1955-08-02, 61 y.o.   MRN: [id number]  HPI 61 y.o. WF presents with cough x 3 weeks, she was getting better but then it got worse this past week after traveling to [place]. Has had some chills/fever earlier in the week. Cough with green mucus, sinus pressure, sore throat, ear fullness.  Has been taking tylenol cold/sinus med with minimal help.   Blood pressure 132/80, pulse 87, temperature 97.6 F (36.4 C), resp. rate 20, height 5' 9.5" (1.765 m), weight 217 lb 9.6 oz (98.7 kg), SpO2 96 %.  Medications Current Outpatient Medications on File Prior to Visit  Medication Sig  . atorvastatin (LIPITOR) 10 MG tablet TAKE 1 TABLET DAILY  . BABY ASPIRIN PO Take 81 mg by mouth daily.  . Calcium-Vitamin D (CALTRATE 600 PLUS-VIT D PO) Take by mouth daily.  Marland Kitchen desloratadine (CLARINEX) 5 MG tablet TAKE 1 TABLET DAILY FOR    ALLERGIES  . Multiple Vitamins-Minerals (MULTIVITAMIN PO) Take by mouth daily.  Marland Kitchen MYRBETRIQ 50 MG TB24 tablet Take 1 tablet (50 mg total) by mouth daily.  . Omega-3 Fatty Acids (FISH OIL PO) Take by mouth daily.  . VESICARE 10 MG tablet TAKE 1 TABLET DAILY   No current facility-administered medications on file prior to visit.     Problem list She has Hyperlipidemia; Allergy; Prediabetes; Obesity; and Labile hypertension on their problem list.   Review of Systems  Constitutional: Negative for chills and diaphoresis.  HENT: Positive for congestion, postnasal drip, sinus pressure and sneezing. Negative for ear pain and sore throat.   Respiratory: Positive for cough. Negative for chest tightness, shortness of breath and wheezing.   Cardiovascular: Negative.   Gastrointestinal: Negative.   Genitourinary: Negative.   Musculoskeletal: Negative for neck pain.  Neurological: Positive for headaches.       Objective:   Physical Exam  Constitutional: She appears well-developed and well-nourished.  HENT:  Head:  Normocephalic and atraumatic.  Right Ear: External ear normal.  Nose: Right sinus exhibits maxillary sinus tenderness. Right sinus exhibits no frontal sinus tenderness. Left sinus exhibits maxillary sinus tenderness. Left sinus exhibits no frontal sinus tenderness.  Eyes: Conjunctivae and EOM are normal.  Neck: Normal range of motion. Neck supple.  Cardiovascular: Normal rate, regular rhythm, normal heart sounds and intact distal pulses.  Pulmonary/Chest: Effort normal and breath sounds normal. No respiratory distress. She has no wheezes.  Abdominal: Soft. Bowel sounds are normal.  Lymphadenopathy:    She has no cervical adenopathy.  Skin: Skin is warm and dry.       Assessment & Plan:   Acute non-recurrent maxillary sinusitis -     azithromycin (ZITHROMAX) 250 MG tablet; Take 2 tablets (500 mg) on  Day 1,  followed by 1 tablet (250 mg) once daily on Days 2 through 5. -     predniSONE (DELTASONE) 20 MG tablet; 2 tablets daily for 3 days, 1 tablet daily for 4 days. -     promethazine-dextromethorphan (PROMETHAZINE-DM) 6.25-15 MG/5ML syrup; Take 5 mLs 4 (four) times daily as needed by mouth for cough.   Future Appointments  Date Time Provider [place]  10/31/2017 10:00 AM Vicie Mutters, PA-C [facility] None

## 2016-12-03 ENCOUNTER — Other Ambulatory Visit: Payer: Self-pay | Admitting: Internal Medicine

## 2016-12-03 DIAGNOSIS — E785 Hyperlipidemia, unspecified: Secondary | ICD-10-CM

## 2016-12-03 DIAGNOSIS — E783 Hyperchylomicronemia: Secondary | ICD-10-CM

## 2016-12-03 DIAGNOSIS — Z0001 Encounter for general adult medical examination with abnormal findings: Secondary | ICD-10-CM

## 2016-12-03 DIAGNOSIS — R32 Unspecified urinary incontinence: Secondary | ICD-10-CM

## 2017-01-19 ENCOUNTER — Telehealth: Payer: Self-pay | Admitting: Physician Assistant

## 2017-01-19 DIAGNOSIS — R05 Cough: Secondary | ICD-10-CM

## 2017-01-19 DIAGNOSIS — J0100 Acute maxillary sinusitis, unspecified: Secondary | ICD-10-CM

## 2017-01-19 DIAGNOSIS — R059 Cough, unspecified: Secondary | ICD-10-CM

## 2017-01-19 MED ORDER — AZITHROMYCIN 250 MG PO TABS: ORAL_TABLET | ORAL | 1 refills | Status: AC

## 2017-01-19 MED ORDER — PROMETHAZINE-DM 6.25-15 MG/5ML PO SYRP: 5.0000 mL | ORAL_SOLUTION | Freq: Four times a day (QID) | ORAL | 1 refills | Status: DC | PRN

## 2017-01-19 MED ORDER — PREDNISONE 20 MG PO TABS: ORAL_TABLET | ORAL | 0 refills | Status: DC

## 2017-01-19 NOTE — Telephone Encounter (Signed)
Pt has been informed of: I have prescribed zpak. You may use an oral decongestant such as Mucinex D or if you have glaucoma or high blood pressure use plain Mucinex. Saline nasal sprays help and can safely be used as often as needed for congestion. Make sure you are on an allergy pill. You can take a few days of prednisone to help decrease symptoms, please remember this is not an antibiotic so you do not have to finish it.   If you develop worsening sinus pain, fever or notice severe headache and vision changes, or if symptoms are not better after completion of antibiotic, please schedule an appointment with a health care provider. If you are getting worse please go to the ER. (Routing comment) . Pt voiced understanding & hung up.

## 2017-01-19 NOTE — Telephone Encounter (Signed)
62 y.o. female calls with 8 days of URI symptoms. She is on Claritin, Mucinex. She has sore throat, sinus drainage, sinus pressure, cough with green mucus, not improving.   Problem list has Hyperlipidemia; Allergy; Prediabetes; Obesity; and Labile hypertension on their problem list.  Medications Current Outpatient Medications on File Prior to Visit  Medication Sig  . atorvastatin (LIPITOR) 10 MG tablet TAKE 1 TABLET DAILY  . BABY ASPIRIN PO Take 81 mg by mouth daily.  . Calcium-Vitamin D (CALTRATE 600 PLUS-VIT D PO) Take by mouth daily.  Marland Kitchen desloratadine (CLARINEX) 5 MG tablet TAKE 1 TABLET DAILY FOR    ALLERGIES  . Multiple Vitamins-Minerals (MULTIVITAMIN PO) Take by mouth daily.  Marland Kitchen MYRBETRIQ 50 MG TB24 tablet Take 1 tablet (50 mg total) by mouth daily.  . Omega-3 Fatty Acids (FISH OIL PO) Take by mouth daily.  . predniSONE (DELTASONE) 20 MG tablet 2 tablets daily for 3 days, 1 tablet daily for 4 days.  . promethazine-dextromethorphan (PROMETHAZINE-DM) 6.25-15 MG/5ML syrup Take 5 mLs 4 (four) times daily as needed by mouth for cough.  . VESICARE 10 MG tablet TAKE 1 TABLET DAILY   No current facility-administered medications on file prior to visit.     No Known Allergies   I have prescribed zpak. You may use an oral decongestant such as Mucinex D or if you have glaucoma or high blood pressure use plain Mucinex.  Saline nasal sprays help and can safely be used as often as needed for congestion. Make sure you are on an allergy pill. You can take a few days of prednisone to help decrease symptoms, please remember this is not an antibiotic so you do not have to finish it. Has not had CXR since 2002, suggest getting CXR at some point.   If you develop worsening sinus pain, fever or notice severe headache and vision changes, or if symptoms are not better after completion of antibiotic, please schedule an appointment with a health care provider. If you are getting worse please go to the ER.

## 2017-02-06 DIAGNOSIS — Z1211 Encounter for screening for malignant neoplasm of colon: Secondary | ICD-10-CM | POA: Diagnosis not present

## 2017-02-06 DIAGNOSIS — Z1212 Encounter for screening for malignant neoplasm of rectum: Secondary | ICD-10-CM | POA: Diagnosis not present

## 2017-02-07 LAB — COLOGUARD: Cologuard: NEGATIVE

## 2017-04-10 NOTE — Progress Notes (Signed)
FOLLOW UP  Assessment and Plan:   Hypertension -Continue medication, monitor blood pressure at home. Continue DASH diet.  Reminder to go to the ER if any CP, SOB, nausea, dizziness, severe HA, changes vision/speech, left arm numbness and tingling and jaw pain.  Cholesterol -Continue diet and exercise. Check cholesterol.    Prediabetes  -Continue diet and exercise. Check A1C  Vitamin D Def - check level and continue medications.   Continue diet and meds as discussed. Further disposition pending results of labs. Over 30 minutes of exam, counseling, chart review, and critical decision making was performed  Future Appointments  Date Time Provider [place]  10/31/2017 10:00 AM Vicie Mutters, PA-C [facility] None     HPI 62 y.o. female  presents for 3 month follow up on hypertension, cholesterol, prediabetes, and vitamin D deficiency.   Her blood pressure has been controlled at home, today their BP is BP: 126/72   She does workout, going to [employer]. She denies chest pain, shortness of breath, dizziness.   She  is on cholesterol medication, lipitor 10 mg daily and denies myalgias. Her cholesterol is at goal. The cholesterol last visit was:   Lab Results  Component Value Date   CHOL 187 10/24/2016   HDL 50 (L) 10/24/2016   LDLCALC 105 (H) 10/24/2016   TRIG 197 (H) 10/24/2016   CHOLHDL 3.7 10/24/2016    She has been working on diet and exercise for prediabetes, and denies paresthesia of the feet, polydipsia, polyuria and visual disturbances. Last A1C in the office was:  Lab Results  Component Value Date   HGBA1C 5.5 10/24/2016   Patient is on Vitamin D supplement.   Lab Results  Component Value Date   VD25OH 51 10/24/2016     BMI is Body mass index is 31.67 kg/m., she is working on diet and exercise. Wt Readings from Last 3 Encounters:  04/11/17 217 lb 9.6 oz (98.7 kg)  11/10/16 217 lb 9.6 oz (98.7 kg)  10/24/16 213 lb 3.2 oz (96.7 kg)   She has done  better with the myrbetriq 50mg  daily than vesicare and would like a prescription.   Current Medications:  Current Outpatient Medications on File Prior to Visit  Medication Sig  . atorvastatin (LIPITOR) 10 MG tablet TAKE 1 TABLET DAILY  . BABY ASPIRIN PO Take 81 mg by mouth daily.  . Calcium-Vitamin D (CALTRATE 600 PLUS-VIT D PO) Take by mouth daily.  Marland Kitchen desloratadine (CLARINEX) 5 MG tablet TAKE 1 TABLET DAILY FOR    ALLERGIES  . Multiple Vitamins-Minerals (MULTIVITAMIN PO) Take by mouth daily.  Marland Kitchen MYRBETRIQ 50 MG TB24 tablet Take 1 tablet (50 mg total) by mouth daily.  . Omega-3 Fatty Acids (FISH OIL PO) Take by mouth daily.  . VESICARE 10 MG tablet TAKE 1 TABLET DAILY   No current facility-administered medications on file prior to visit.     Medical History:  Past Medical History:  Diagnosis Date  . Allergy   . Hyperlipidemia    Allergies: No Known Allergies   Review of Systems:  Review of Systems  Constitutional: Negative.   HENT: Negative.   Eyes: Negative.   Respiratory: Negative.   Cardiovascular: Negative.   Gastrointestinal: Negative.   Genitourinary: Negative.   Musculoskeletal: Negative.   Skin: Negative.     Family history- Review and unchanged Social history- Review and unchanged Physical Exam: BP 126/72   Pulse 74   Temp 97.6 F (36.4 C)   Resp 14   Ht  5' 9.5" (1.765 m)   Wt 217 lb 9.6 oz (98.7 kg)   SpO2 97%   BMI 31.67 kg/m  Wt Readings from Last 3 Encounters:  04/11/17 217 lb 9.6 oz (98.7 kg)  11/10/16 217 lb 9.6 oz (98.7 kg)  10/24/16 213 lb 3.2 oz (96.7 kg)   General Appearance: Well nourished, in no apparent distress. Eyes: PERRLA, EOMs, conjunctiva no swelling or erythema Sinuses: No Frontal/maxillary tenderness ENT/Mouth: Ext aud canals clear, TMs without erythema, bulging. No erythema, swelling, or exudate on post pharynx.  Tonsils not swollen or erythematous. Hearing normal.  Neck: Supple, thyroid normal.  Respiratory: Respiratory  effort normal, BS equal bilaterally without rales, rhonchi, wheezing or stridor.  Cardio: RRR with no MRGs. Brisk peripheral pulses without edema.  Abdomen: Soft, + BS,  Non tender, no guarding, rebound, hernias, masses. Lymphatics: Non tender without lymphadenopathy.  Musculoskeletal: Full ROM, 5/5 strength, Normal gait Skin: Warm, dry without rashes, lesions, ecchymosis.  Neuro: Cranial nerves intact. Normal muscle tone, no cerebellar symptoms. Psych: Awake and oriented X 3, normal affect, Insight and Judgment appropriate.    Vicie Mutters, PA-C 8:46 AM [facility]

## 2017-04-11 ENCOUNTER — Ambulatory Visit (INDEPENDENT_AMBULATORY_CARE_PROVIDER_SITE_OTHER): Payer: 59 | Admitting: Physician Assistant

## 2017-04-11 ENCOUNTER — Encounter: Payer: Self-pay | Admitting: Physician Assistant

## 2017-04-11 VITALS — BP 126/72 | HR 74 | Temp 97.6°F | Resp 14 | Ht 69.5 in | Wt 217.6 lb

## 2017-04-11 DIAGNOSIS — E785 Hyperlipidemia, unspecified: Secondary | ICD-10-CM | POA: Diagnosis not present

## 2017-04-11 DIAGNOSIS — R0989 Other specified symptoms and signs involving the circulatory and respiratory systems: Secondary | ICD-10-CM

## 2017-04-11 DIAGNOSIS — R7303 Prediabetes: Secondary | ICD-10-CM

## 2017-04-11 DIAGNOSIS — N3946 Mixed incontinence: Secondary | ICD-10-CM | POA: Diagnosis not present

## 2017-04-11 LAB — BASIC METABOLIC PANEL WITH GFR
BUN: 18 mg/dL (ref 7–25)
CALCIUM: 9.1 mg/dL (ref 8.6–10.4)
CO2: 28 mmol/L (ref 20–32)
Chloride: 104 mmol/L (ref 98–110)
Creat: 0.82 mg/dL (ref 0.50–0.99)
GFR, Est African American: 88 mL/min/{1.73_m2} (ref >=60)
GFR, Est Non African American: 76 mL/min/{1.73_m2} (ref >=60)
Glucose, Bld: 107 mg/dL — ABNORMAL HIGH (ref 65–99)
POTASSIUM: 4.6 mmol/L (ref 3.5–5.3)
Sodium: 138 mmol/L (ref 135–146)

## 2017-04-11 LAB — CBC WITH DIFFERENTIAL/PLATELET
BASOS PCT: 0.9 %
Basophils Absolute: 41 cells/uL (ref 0–200)
EOS PCT: 3.5 %
Eosinophils Absolute: 161 cells/uL (ref 15–500)
HCT: 39.8 % (ref 35.0–45.0)
Hemoglobin: 14 g/dL (ref 11.7–15.5)
LYMPHS ABS: 1481 {cells}/uL (ref 850–3900)
MCH: 32.1 pg (ref 27.0–33.0)
MCHC: 35.2 g/dL (ref 32.0–36.0)
MCV: 91.3 fL (ref 80.0–100.0)
MPV: 10.3 fL (ref 7.5–12.5)
Monocytes Relative: 13.7 %
NEUTROS PCT: 49.7 %
Neutro Abs: 2286 cells/uL (ref 1500–7800)
Platelets: 257 10*3/uL (ref 140–400)
RBC: 4.36 10*6/uL (ref 3.80–5.10)
RDW: 11.8 % (ref 11.0–15.0)
TOTAL LYMPHOCYTE: 32.2 %
WBC mixed population: 630 cells/uL (ref 200–950)
WBC: 4.6 10*3/uL (ref 3.8–10.8)

## 2017-04-11 LAB — LIPID PANEL
CHOL/HDL RATIO: 4.5 (calc) (ref <5.0)
Cholesterol: 184 mg/dL (ref <200)
HDL: 41 mg/dL — ABNORMAL LOW (ref >50)
LDL CHOLESTEROL (CALC): 118 mg/dL — AB
Non-HDL Cholesterol (Calc): 143 mg/dL (calc) — ABNORMAL HIGH (ref <130)
Triglycerides: 135 mg/dL (ref <150)

## 2017-04-11 LAB — HEPATIC FUNCTION PANEL
AG Ratio: 1.9 (calc) (ref 1.0–2.5)
ALBUMIN MSPROF: 4.3 g/dL (ref 3.6–5.1)
ALT: 19 U/L (ref 6–29)
AST: 24 U/L (ref 10–35)
Alkaline phosphatase (APISO): 70 U/L (ref 33–130)
Bilirubin, Direct: 0.1 mg/dL (ref 0.0–0.2)
Globulin: 2.3 g/dL (calc) (ref 1.9–3.7)
Indirect Bilirubin: 0.4 mg/dL (calc) (ref 0.2–1.2)
TOTAL PROTEIN: 6.6 g/dL (ref 6.1–8.1)
Total Bilirubin: 0.5 mg/dL (ref 0.2–1.2)

## 2017-04-11 MED ORDER — MYRBETRIQ 50 MG PO TB24: 50.0000 mg | ORAL_TABLET | Freq: Every day | ORAL | 1 refills | Status: DC

## 2017-04-11 NOTE — Patient Instructions (Addendum)
VENOUS INSUFFICIENCY Our lower leg venous system is not the most reliable, the heart does NOT pump fluid up, there is a valve system.  The muscles of the leg squeeze and the blood moves up and a valve opens and close, then they squeeze, blood moves up and valves open and closes keeping the blood moving towards the heart.  Lots can go wrong with this valve system.  If someone is sitting or standing without movement, everyone will get swelling.  THINGS TO DO:  Do not stand or sit in one position for long periods of time. Do not sit with your legs crossed. Rest with your legs raised during the day.  Your legs have to be higher than your heart so that gravity will force the valves to open, so please really elevate your legs.   Wear elastic stockings or support hose. Do not wear other tight, encircling garments around the legs, pelvis, or waist.  [employer]  has a wide variety of well priced compression stockings. [place], [place] [id number] [id number] [place] has a good cheap selection, I like the socks, they are not as hard to get on  Walk as much as possible to increase blood flow.  Raise the foot of your bed at night with 2-inch blocks.  SEEK MEDICAL CARE IF:   The skin around your ankle starts to break down.  You have pain, redness, tenderness, or hard swelling developing in your leg over a vein.  You are uncomfortable due to leg pain.  If you ever have shortness of breath with exertion or chest pain go to the ER.   Can do a steroid nasal spary 1-2 sparys at night each nostril. Remember to spray each nostril twice towards the outer part of your eye.  Do not sniff but instead pinch your nose and tilt your head back to help the medicine get into your sinuses.  The best time to do this is at bedtime. Stop if you get blurred vision or nose bleeds.    Check out  Mini habits for weight loss book  2 apps for tracking food is myfitness pal  loseit OR can  take picture of your food  Veggies are great because you can eat a ton! They are low in calories, great to fill you up, and have a ton of vitamins, minerals, and protein.

## 2017-04-22 ENCOUNTER — Other Ambulatory Visit: Payer: Self-pay | Admitting: Internal Medicine

## 2017-04-22 DIAGNOSIS — E785 Hyperlipidemia, unspecified: Secondary | ICD-10-CM

## 2017-04-22 DIAGNOSIS — E783 Hyperchylomicronemia: Secondary | ICD-10-CM

## 2017-04-22 DIAGNOSIS — Z0001 Encounter for general adult medical examination with abnormal findings: Secondary | ICD-10-CM

## 2017-05-22 DIAGNOSIS — L920 Granuloma annulare: Secondary | ICD-10-CM | POA: Diagnosis not present

## 2017-05-27 ENCOUNTER — Other Ambulatory Visit: Payer: Self-pay | Admitting: Internal Medicine

## 2017-07-11 ENCOUNTER — Other Ambulatory Visit: Payer: Self-pay | Admitting: Physician Assistant

## 2017-07-11 DIAGNOSIS — Z0001 Encounter for general adult medical examination with abnormal findings: Secondary | ICD-10-CM

## 2017-07-11 DIAGNOSIS — E783 Hyperchylomicronemia: Secondary | ICD-10-CM

## 2017-07-11 DIAGNOSIS — E785 Hyperlipidemia, unspecified: Secondary | ICD-10-CM

## 2017-09-12 ENCOUNTER — Telehealth: Payer: Self-pay

## 2017-09-12 ENCOUNTER — Telehealth: Payer: Self-pay | Admitting: Physician Assistant

## 2017-09-12 MED ORDER — SULFAMETHOXAZOLE-TRIMETHOPRIM 800-160 MG PO TABS: 1.0000 | ORAL_TABLET | Freq: Two times a day (BID) | ORAL | 0 refills | Status: AC

## 2017-09-12 NOTE — Telephone Encounter (Signed)
LVM informing the pt that the RX has been sent to pharmacy

## 2017-09-12 NOTE — Telephone Encounter (Signed)
-----   Message from Vicie Mutters, [place] sent at 09/12/2017  1:11 PM EDT ----- Regarding: RE: MED  Sent in bactrim to treat UTI/sinus, if not better needs OV ----- Message ----- From: Elenor Quinones, CMA Sent: 09/12/2017  11:29 AM EDT To: Vicie Mutters, PA-C Subject: MED                                            Pt reports this has been going on for a  few days now  (poss bladder infection) sxs: burning, urgency, urine odor, head congestion, bad cough, drainage( note does not say from where), & has no fever. She would like you call in her something.   [facility]

## 2017-09-12 NOTE — Telephone Encounter (Signed)
LVM informing pt that an ABX has been sent to the pharmacy.

## 2017-09-12 NOTE — Telephone Encounter (Signed)
-----   Message from Elenor Quinones, [place] sent at 09/12/2017 11:29 AM EDT ----- Regarding: MED  Pt reports this has been going on for a  few days now  (poss bladder infection) sxs: burning, urgency, urine odor, head congestion, bad cough, drainage( note does not say from where), & has no fever. She would like you call in her something.   [facility]

## 2017-09-12 NOTE — Telephone Encounter (Signed)
Send in bactrim because this would treat a sinus infection or UTI, if not better needs office visit.

## 2017-09-23 ENCOUNTER — Other Ambulatory Visit: Payer: Self-pay | Admitting: Physician Assistant

## 2017-10-09 ENCOUNTER — Other Ambulatory Visit: Payer: Self-pay | Admitting: Adult Health

## 2017-10-09 DIAGNOSIS — Z0001 Encounter for general adult medical examination with abnormal findings: Secondary | ICD-10-CM

## 2017-10-09 DIAGNOSIS — E783 Hyperchylomicronemia: Secondary | ICD-10-CM

## 2017-10-09 DIAGNOSIS — E785 Hyperlipidemia, unspecified: Secondary | ICD-10-CM

## 2017-10-11 ENCOUNTER — Telehealth: Payer: Self-pay | Admitting: Physician Assistant

## 2017-10-11 MED ORDER — PROMETHAZINE HCL 25 MG PO TABS: 25.0000 mg | ORAL_TABLET | Freq: Four times a day (QID) | ORAL | 0 refills | Status: DC | PRN

## 2017-10-11 MED ORDER — CIPROFLOXACIN HCL 500 MG PO TABS: ORAL_TABLET | ORAL | 0 refills | Status: DC

## 2017-10-11 MED ORDER — AZITHROMYCIN 250 MG PO TABS: ORAL_TABLET | ORAL | 0 refills | Status: DC

## 2017-10-11 NOTE — Telephone Encounter (Signed)
-----   Message from Elenor Quinones, [place] sent at 10/10/2017  2:58 PM EDT ----- Regarding: TRAVEL MEDS GOING OUT OF TOWN WOULD LIKE ABX  AND WHAT EVER YOU THINK SHE WOULD NEED SENT TO PHARMACY

## 2017-10-31 ENCOUNTER — Encounter: Payer: Self-pay | Admitting: Physician Assistant

## 2017-11-06 DIAGNOSIS — N3281 Overactive bladder: Secondary | ICD-10-CM | POA: Insufficient documentation

## 2017-11-06 NOTE — Progress Notes (Addendum)
Complete Physical  Assessment and Plan:  Encounter for general adult medical examination with abnormal findings  Hyperlipidemia -continue medications, check lipids, decrease fatty foods, increase activity.  - if LDL remains above goal will switch to rosuvastatin 5 mg  - Lipid panel - EKG 12-Lead  Other abnormal glucose Discussed general issues about diabetes pathophysiology and management., Educational material distributed., Suggested low cholesterol diet., Encouraged aerobic exercise., Discussed foot care., Reminded to get yearly retinal exam. - Hemoglobin A1c   Obesity Obesity with co morbidities- long discussion about weight loss, diet, and exercise  Labile hypertension - continue medications, DASH diet, exercise and monitor at home. Call if greater than 130/80.  - CBC with Differential/Platelet - CMP/GGFR - TSH - Urinalysis, Routine w reflex microscopic (not at [facility]) - Microalbumin / creatinine urine ratio - EKG 12-Lead  Need for prophylactic vaccination and inoculation against influenza - Flu vaccine > 3yo with preservative IM (Fluvirin Influenza Split)  Allergy, subsequent encounter Continue OTC meds  Medication management - Magnesium   Vitamin D deficiency - Vit D  25 hydroxy (rtn osteoporosis monitoring)   Incontinence/OAB Fairly controlled with myrbetriq; vesicare worked better but was expensive  Needs for influenza vaccination - administered today  Discussed med's effects and SE's. Screening labs and tests as requested with regular follow-up as recommended. Future Appointments  Date Time Provider [place]  11/11/2018  9:00 AM Kristina Comber, NP [facility] None    HPI 62 y.o. female  presents for a complete physical. She has Hyperlipidemia; Allergy; Other abnormal glucose; Obesity (BMI 30.0-34.9); Labile hypertension; and OAB (overactive bladder) on their problem list.  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 Has 3 kids, 5 grandsons and 3 grand daughters. Wants to travel has camper and wants to drive the [place] and [place]. Mother passed away 04-09-2015 at 86.   She is on myrbetriq for OAB/incontinence and doing well with this.   BMI is Body mass index is 30.64 kg/m., she has been working on diet and exercise, going to [employer] 3 days a week. She has been cutting sugar, avoiding starches. She reports 65+ fluid ounces of water daily. She drinks occasional ice tea with splenda. She eats 5+ servings of fruits vegetables.  Wt Readings from Last 3 Encounters:  11/07/17 209 lb (94.8 kg)  04/11/17 217 lb 9.6 oz (98.7 kg)  11/10/16 217 lb 9.6 oz (98.7 kg)    Her blood pressure has been controlled at home, today their BP is BP: 118/80 She does workout, just started to walk . She denies chest pain, shortness of breath, dizziness.   She is on cholesterol medication atorvastatin 10 mg daily and denies myalgias. Her cholesterol is at goal. The cholesterol last visit was:   Lab Results  Component Value Date   CHOL 184 04/11/2017   HDL 41 (L) 04/11/2017   LDLCALC 118 (H) 04/11/2017   TRIG 135 04/11/2017   CHOLHDL 4.5 04/11/2017    She has been working on diet and exercise for glucose management, and denies paresthesia of the feet, polydipsia and polyuria. Last A1C in the office was:  Lab Results  Component Value Date   HGBA1C 5.5 10/24/2016   Lab Results  Component Value Date   GFRNONAA 04/09/74 04/11/2017   Patient is on Vitamin D supplement.   Lab Results  Component Value Date   VD25OH 2049-04-08 10/24/2016       Current Medications:  Current Outpatient Medications on File Prior to  Visit  Medication Sig Dispense Refill  . atorvastatin (LIPITOR) 10 MG tablet TAKE 1 TABLET DAILY 90 tablet 0  . BABY ASPIRIN PO Take 81 mg by mouth daily.    . Calcium-Vitamin D (CALTRATE 600 PLUS-VIT D PO) Take by mouth daily.    Marland Kitchen desloratadine (CLARINEX) 5 MG tablet TAKE 1 TABLET DAILY FOR    ALLERGIES 90 tablet 1  .  Multiple Vitamins-Minerals (MULTIVITAMIN PO) Take by mouth daily.    Marland Kitchen MYRBETRIQ 50 MG TB24 tablet TAKE 1 TABLET BY MOUTH EVERY DAY 90 tablet 1  . Omega-3 Fatty Acids (FISH OIL PO) Take by mouth daily.    . promethazine (PHENERGAN) 25 MG tablet Take 1 tablet (25 mg total) by mouth every 6 (six) hours as needed for nausea or vomiting (or motion sickness). Max: 4 tablets per day 30 tablet 0  . VESICARE 10 MG tablet TAKE 1 TABLET DAILY 90 tablet 0   No current facility-administered medications on file prior to visit.    Health Maintenance:   Immunization History  Administered Date(s) Administered  . Influenza Inj Mdck Quad With Preservative 10/24/2016  . Influenza Split 10/20/2014  . Influenza, Seasonal, Injecte, Preservative Fre 10/20/2015  . Tdap 07/13/2009   Tetanus: 2011 Pneumovax: 1997 Prevnar 13: due age 68 Flu vaccine: 2018, TODAY Zostavax: declines  Pap: 2011 declines had hysterectomy, no further per GYN MGM: 10/2016 CAT B, needs to schedule DEXA: N/A Colonoscopy: 2006  Cologuard: 02/2017 neg EGD: N/A MRI brain 2002 MRI cervical spine 2002  Last eye exam: [name], last exam 02/2017 Last dental exam: [name], 07/2017 Last derm exam: [name], last exam 2019, goes annually  Patient Care Team: [name], MD as PCP - General (Internal Medicine) [name], MD as Consulting Physician (Orthopedic Surgery) [name], MD as Consulting Physician (Dermatology) [name], [name], MD as Consulting Physician (Gastroenterology)  [name], eye doctor - Feb 2016, wears glasses [facility] q 6 months  Medical History:  Past Medical History:  Diagnosis Date  . Allergy   . Hyperlipidemia    Allergies No Known Allergies  SURGICAL HISTORY She  has a past surgical history that includes Tubal ligation (Bilateral, 1986) and Abdominal hysterectomy (1990). FAMILY HISTORY Her family history includes Arthritis in her father and mother; Diabetes  in her father; Hyperlipidemia in her father and mother; Hypertension in her father and mother. SOCIAL HISTORY She  reports that she has never smoked. She has never used smokeless tobacco. She reports that she does not drink alcohol or use drugs.   Review of Systems  Constitutional: Negative.  Negative for malaise/fatigue and weight loss.  HENT: Negative.  Negative for hearing loss and tinnitus.   Eyes: Negative.  Negative for blurred vision and double vision.  Respiratory: Negative.  Negative for cough, sputum production, shortness of breath and wheezing.   Cardiovascular: Negative.  Negative for chest pain, palpitations, orthopnea, claudication, leg swelling and PND.  Gastrointestinal: Negative.  Negative for abdominal pain, blood in stool, constipation, diarrhea, heartburn, melena, nausea and vomiting.  Genitourinary: Negative.   Musculoskeletal: Negative.  Negative for falls, joint pain and myalgias.  Skin: Negative.  Negative for rash.  Neurological: Negative.  Negative for dizziness, tingling, sensory change, weakness and headaches.  Endo/Heme/Allergies: Negative.  Negative for polydipsia.  Psychiatric/Behavioral: Negative.  Negative for depression, memory loss, substance abuse and suicidal ideas. The patient is not nervous/anxious and does not have insomnia.   All other systems reviewed and are negative.  Physical Exam: Estimated body mass index is 30.64 kg/m as calculated from the following:   Height as of this encounter: 5' 9.25" (1.759 m).   Weight as of this encounter: 209 lb (94.8 kg). BP 118/80   Pulse 68   Temp (!) 97.5 F (36.4 C)   Ht 5' 9.25" (1.759 m)   Wt 209 lb (94.8 kg)   SpO2 99%   BMI 30.64 kg/m  General Appearance: Well nourished, in no apparent distress. Eyes: PERRLA, EOMs, conjunctiva no swelling or erythema, normal fundi and vessels. Sinuses: No Frontal/maxillary tenderness ENT/Mouth: Ext aud canals clear, normal light reflex with TMs without  erythema, bulging.  Good dentition. No erythema, swelling, or exudate on post pharynx. Tonsils not swollen or erythematous. Hearing normal.  Neck: Supple, thyroid normal. No bruits Respiratory: Respiratory effort normal, BS equal bilaterally without rales, rhonchi, wheezing or stridor. Cardio: RRR without murmurs, rubs or gallops. Brisk peripheral pulses without edema.  Chest: symmetric, with normal excursions and percussion. Breasts: breasts appear normal, no suspicious masses, no skin or nipple changes or axillary nodes. Abdomen: Soft, +BS. Non tender, no guarding, rebound, hernias, masses, or organomegaly. .  Lymphatics: Non tender without lymphadenopathy.  Genitourinary: defer Musculoskeletal: Full ROM all peripheral extremities,5/5 strength, and normal gait. Skin: Warm, dry without rashes, lesions, ecchymosis.  Neuro: Cranial nerves intact, reflexes equal bilaterally. Normal muscle tone, no cerebellar symptoms. Sensation intact.  Psych: Awake and oriented X 3, normal affect, Insight and Judgment appropriate.   EKG: WNL no ST changes, poss mild LVH, monitor  AORTA SCAN: defer   Kristina Hunter 9:20 AM

## 2017-11-07 ENCOUNTER — Ambulatory Visit (INDEPENDENT_AMBULATORY_CARE_PROVIDER_SITE_OTHER): Payer: 59 | Admitting: Adult Health

## 2017-11-07 ENCOUNTER — Other Ambulatory Visit: Payer: Self-pay | Admitting: Internal Medicine

## 2017-11-07 ENCOUNTER — Encounter: Payer: Self-pay | Admitting: Adult Health

## 2017-11-07 VITALS — BP 118/80 | HR 68 | Temp 97.5°F | Ht 69.25 in | Wt 209.0 lb

## 2017-11-07 DIAGNOSIS — E785 Hyperlipidemia, unspecified: Secondary | ICD-10-CM | POA: Diagnosis not present

## 2017-11-07 DIAGNOSIS — Z1231 Encounter for screening mammogram for malignant neoplasm of breast: Secondary | ICD-10-CM

## 2017-11-07 DIAGNOSIS — R0989 Other specified symptoms and signs involving the circulatory and respiratory systems: Secondary | ICD-10-CM

## 2017-11-07 DIAGNOSIS — T7840XD Allergy, unspecified, subsequent encounter: Secondary | ICD-10-CM

## 2017-11-07 DIAGNOSIS — Z136 Encounter for screening for cardiovascular disorders: Secondary | ICD-10-CM | POA: Diagnosis not present

## 2017-11-07 DIAGNOSIS — Z0000 Encounter for general adult medical examination without abnormal findings: Secondary | ICD-10-CM | POA: Diagnosis not present

## 2017-11-07 DIAGNOSIS — E559 Vitamin D deficiency, unspecified: Secondary | ICD-10-CM

## 2017-11-07 DIAGNOSIS — Z0001 Encounter for general adult medical examination with abnormal findings: Secondary | ICD-10-CM

## 2017-11-07 DIAGNOSIS — E669 Obesity, unspecified: Secondary | ICD-10-CM

## 2017-11-07 DIAGNOSIS — R7309 Other abnormal glucose: Secondary | ICD-10-CM | POA: Diagnosis not present

## 2017-11-07 DIAGNOSIS — Z23 Encounter for immunization: Secondary | ICD-10-CM | POA: Diagnosis not present

## 2017-11-07 DIAGNOSIS — Z131 Encounter for screening for diabetes mellitus: Secondary | ICD-10-CM | POA: Diagnosis not present

## 2017-11-07 DIAGNOSIS — Z79899 Other long term (current) drug therapy: Secondary | ICD-10-CM | POA: Diagnosis not present

## 2017-11-07 DIAGNOSIS — N3281 Overactive bladder: Secondary | ICD-10-CM

## 2017-11-07 NOTE — Patient Instructions (Addendum)
Ask your insurance about shingrix coverage   Please schedule mammogram    Kristina Hunter , Thank you for taking time to come for your Medicare Wellness Visit. I appreciate your ongoing commitment to your health goals. Please review the following plan we discussed and let me know if I can assist you in the future.   These are the goals we discussed: Goals    . LDL CALC < 100    . Weight (lb) < 200 lb (90.7 kg)       This is a list of the screening recommended for you and due dates:  Health Maintenance  Topic Date Due  . Colon Cancer Screening  10/09/2003  . Flu Shot  08/02/2017  . Mammogram  10/26/2018  . Tetanus Vaccine  07/14/2019  .  Hepatitis C: One time screening is recommended by Center for Disease Control  (CDC) for  adults born from [birth year] through 1967.   Completed  . HIV Screening  Completed     Know what a healthy weight is for you (roughly BMI <25) and aim to maintain this  Aim for 7+ servings of fruits and vegetables daily  65-80+ fluid ounces of water or unsweet tea for healthy kidneys  Limit to max 1 drink of alcohol per day; avoid smoking/tobacco  Limit animal fats in diet for cholesterol and heart health - choose grass fed whenever available  Avoid highly processed foods, and foods high in saturated/trans fats  Aim for low stress - take time to unwind and care for your mental health  Aim for 150 min of moderate intensity exercise weekly for heart health, and weights twice weekly for bone health  Aim for 7-9 hours of sleep daily        When it comes to diets, agreement about the perfect plan isn't easy to find, even among the experts. Experts at the [employer] developed an idea known as the Healthy Eating Plate. Just imagine a plate divided into logical, healthy portions.  The emphasis is on diet quality:  Load up on vegetables and fruits - one-half of your plate: Aim for color and variety, and remember that potatoes don't  count.  Go for whole grains - one-quarter of your plate: Whole wheat, barley, wheat berries, quinoa, oats, brown rice, and foods made with them. If you want pasta, go with whole wheat pasta.  Protein power - one-quarter of your plate: Fish, chicken, beans, and nuts are all healthy, versatile protein sources. Limit red meat.  The diet, however, does go beyond the plate, offering a few other suggestions.  Use healthy plant oils, such as olive, canola, soy, corn, sunflower and peanut. Check the labels, and avoid partially hydrogenated oil, which have unhealthy trans fats.  If you're thirsty, drink water. Coffee and tea are good in moderation, but skip sugary drinks and limit milk and dairy products to one or two daily servings.  The type of carbohydrate in the diet is more important than the amount. Some sources of carbohydrates, such as vegetables, fruits, whole grains, and beans-are healthier than others.  Finally, stay active.

## 2017-11-07 NOTE — Addendum Note (Signed)
Addended by: Chancy Hurter on: 11/07/2017 10:11 AM   Modules accepted: Orders

## 2017-11-08 ENCOUNTER — Other Ambulatory Visit: Payer: Self-pay | Admitting: Adult Health

## 2017-11-08 MED ORDER — ROSUVASTATIN CALCIUM 5 MG PO TABS: 5.0000 mg | ORAL_TABLET | Freq: Every day | ORAL | 1 refills | Status: DC

## 2017-11-09 LAB — LIPID PANEL
Cholesterol: 192 mg/dL (ref <200)
HDL: 49 mg/dL — ABNORMAL LOW (ref >50)
LDL Cholesterol (Calc): 112 mg/dL (calc) — ABNORMAL HIGH
Non-HDL Cholesterol (Calc): 143 mg/dL (calc) — ABNORMAL HIGH (ref <130)
Total CHOL/HDL Ratio: 3.9 (calc) (ref <5.0)
Triglycerides: 195 mg/dL — ABNORMAL HIGH (ref <150)

## 2017-11-09 LAB — CBC WITH DIFFERENTIAL/PLATELET
BASOS ABS: 59 {cells}/uL (ref 0–200)
Basophils Relative: 1.1 %
EOS ABS: 119 {cells}/uL (ref 15–500)
Eosinophils Relative: 2.2 %
HEMATOCRIT: 40.1 % (ref 35.0–45.0)
HEMOGLOBIN: 13.5 g/dL (ref 11.7–15.5)
LYMPHS ABS: 1679 {cells}/uL (ref 850–3900)
MCH: 30.9 pg (ref 27.0–33.0)
MCHC: 33.7 g/dL (ref 32.0–36.0)
MCV: 91.8 fL (ref 80.0–100.0)
MPV: 10 fL (ref 7.5–12.5)
Monocytes Relative: 7.2 %
NEUTROS ABS: 3154 {cells}/uL (ref 1500–7800)
Neutrophils Relative %: 58.4 %
Platelets: 304 10*3/uL (ref 140–400)
RBC: 4.37 10*6/uL (ref 3.80–5.10)
RDW: 11.3 % (ref 11.0–15.0)
Total Lymphocyte: 31.1 %
WBC mixed population: 389 cells/uL (ref 200–950)
WBC: 5.4 10*3/uL (ref 3.8–10.8)

## 2017-11-09 LAB — URINALYSIS W MICROSCOPIC + REFLEX CULTURE
BILIRUBIN URINE: NEGATIVE
Bacteria, UA: NONE SEEN /HPF
GLUCOSE, UA: NEGATIVE
Hgb urine dipstick: NEGATIVE
Hyaline Cast: NONE SEEN /LPF
KETONES UR: NEGATIVE
Nitrites, Initial: NEGATIVE
PROTEIN: NEGATIVE
RBC / HPF: NONE SEEN /HPF (ref 0–2)
Specific Gravity, Urine: 1.015 (ref 1.001–1.03)
pH: 6 (ref 5.0–8.0)

## 2017-11-09 LAB — COMPLETE METABOLIC PANEL WITH GFR
AG RATIO: 1.8 (calc) (ref 1.0–2.5)
ALKALINE PHOSPHATASE (APISO): 73 U/L (ref 33–130)
ALT: 26 U/L (ref 6–29)
AST: 25 U/L (ref 10–35)
Albumin: 4.4 g/dL (ref 3.6–5.1)
BILIRUBIN TOTAL: 0.5 mg/dL (ref 0.2–1.2)
BUN: 21 mg/dL (ref 7–25)
CHLORIDE: 102 mmol/L (ref 98–110)
CO2: 29 mmol/L (ref 20–32)
Calcium: 9.6 mg/dL (ref 8.6–10.4)
Creat: 0.87 mg/dL (ref 0.50–0.99)
GFR, Est African American: 82 mL/min/{1.73_m2} (ref >=60)
GFR, Est Non African American: 70 mL/min/{1.73_m2} (ref >=60)
GLUCOSE: 85 mg/dL (ref 65–99)
Globulin: 2.5 g/dL (calc) (ref 1.9–3.7)
POTASSIUM: 5 mmol/L (ref 3.5–5.3)
Sodium: 138 mmol/L (ref 135–146)
Total Protein: 6.9 g/dL (ref 6.1–8.1)

## 2017-11-09 LAB — HEMOGLOBIN A1C
Hgb A1c MFr Bld: 5.6 % of total Hgb (ref <5.7)
MEAN PLASMA GLUCOSE: 114 (calc)
eAG (mmol/L): 6.3 (calc)

## 2017-11-09 LAB — MAGNESIUM: MAGNESIUM: 2.3 mg/dL (ref 1.5–2.5)

## 2017-11-09 LAB — MICROALBUMIN / CREATININE URINE RATIO
Creatinine, Urine: 63 mg/dL (ref 20–275)
Microalb Creat Ratio: 5 mcg/mg creat (ref <30)
Microalb, Ur: 0.3 mg/dL

## 2017-11-09 LAB — CULTURE INDICATED

## 2017-11-09 LAB — URINE CULTURE
MICRO NUMBER: 91342013
SPECIMEN QUALITY: ADEQUATE

## 2017-11-09 LAB — VITAMIN D 25 HYDROXY (VIT D DEFICIENCY, FRACTURES): Vit D, 25-Hydroxy: 45 ng/mL (ref 30–100)

## 2017-11-09 LAB — TSH: TSH: 1.27 m[IU]/L (ref 0.40–4.50)

## 2017-11-18 ENCOUNTER — Other Ambulatory Visit: Payer: Self-pay | Admitting: Adult Health

## 2017-12-19 ENCOUNTER — Ambulatory Visit
Admission: RE | Admit: 2017-12-19 | Discharge: 2017-12-19 | Disposition: A | Discharge status: Home / Self Care | Payer: 59 | Source: Ambulatory Visit | Attending: Internal Medicine | Admitting: Internal Medicine

## 2017-12-19 DIAGNOSIS — Z1231 Encounter for screening mammogram for malignant neoplasm of breast: Secondary | ICD-10-CM | POA: Diagnosis not present

## 2018-01-21 DIAGNOSIS — L920 Granuloma annulare: Secondary | ICD-10-CM | POA: Diagnosis not present

## 2018-01-21 DIAGNOSIS — L814 Other melanin hyperpigmentation: Secondary | ICD-10-CM | POA: Diagnosis not present

## 2018-01-21 DIAGNOSIS — D1801 Hemangioma of skin and subcutaneous tissue: Secondary | ICD-10-CM | POA: Diagnosis not present

## 2018-01-21 DIAGNOSIS — D225 Melanocytic nevi of trunk: Secondary | ICD-10-CM | POA: Diagnosis not present

## 2018-03-26 ENCOUNTER — Other Ambulatory Visit: Payer: Self-pay | Admitting: Physician Assistant

## 2018-05-14 DIAGNOSIS — E559 Vitamin D deficiency, unspecified: Secondary | ICD-10-CM | POA: Insufficient documentation

## 2018-05-14 NOTE — Progress Notes (Signed)
FOLLOW UP  Assessment and Plan:   Hypertension Well controlled off of medications at this time Monitor blood pressure at home; patient to call if consistently greater than 130/80 Continue DASH diet.   Reminder to go to the ER if any CP, SOB, nausea, dizziness, severe HA, changes vision/speech, left arm numbness and tingling and jaw pain.  Cholesterol Currently above goal; switched to rosuvastatin at last visit and tolerating well  Continue low cholesterol diet and exercise.  Check lipid panel.   Abnormal glucose Recent A1Cs at goal Discussed diet/exercise, weight management  Defer A1C; check CMP  Obesity with co morbidities Long discussion about weight loss, diet, and exercise Recommended diet heavy in fruits and veggies and low in animal meats, cheeses, and dairy products, appropriate calorie intake Discussed ideal weight for height  Will follow up in 3 months  Vitamin D Def Below goal at last visit; she has added 2000 IU daily  continue supplementation to maintain goal of 60-100 Defer Vit D level  OAB Well controlled with myrbetriq; cost is a barrier between insurance right now; samples provided, she will try taking 1/2 tab daily   Continue diet and meds as discussed. Further disposition pending results of labs. Discussed med's effects and SE's.   Over 30 minutes of exam, counseling, chart review, and critical decision making was performed.   Future Appointments  Date Time Provider [place]  11/11/2018  9:00 AM [name], NP [facility] None    ----------------------------------------------------------------------------------------------------------------------  HPI 63 y.o. female  presents for 3 month follow up on hypertension, cholesterol, glucose management, weight and vitamin D deficiency.   Retired this past year and loving life. Has 3 kids, 5 grandsons and 3 grand daughters. Wants to travel has camper and wants to drive the [place] and [place].   She  is on myrbetriq for OAB/incontinence and doing well with this. She had been on vesicare in the past.  BMI is Body mass index is 30.93 kg/m., she has been working on diet and exercise. She has been working on diet and exercise, was going to [employer] 3 days a week. She is walking instead. Cooking more at home. She has been cutting sugar, avoiding starches. She reports 65+ fluid ounces of water daily. She drinks occasional ice tea with splenda. She eats 5+ servings of fruits vegetables.  Wt Readings from Last 3 Encounters:  05/15/18 211 lb (95.7 kg)  11/07/17 209 lb (94.8 kg)  04/11/17 217 lb 9.6 oz (98.7 kg)   Her blood pressure has been controlled at home, today their BP is BP: 122/84  She does workout. She denies chest pain, shortness of breath, dizziness.   She is on cholesterol medication Rosuvastatin 5 mg daily (recently transitioned from atorvastatin) and denies myalgias. Her cholesterol is not at goal. The cholesterol last visit was:   Lab Results  Component Value Date   CHOL 192 11/07/2017   HDL 49 (L) 11/07/2017   LDLCALC 112 (H) 11/07/2017   TRIG 195 (H) 11/07/2017   CHOLHDL 3.9 11/07/2017    She has been working on diet and exercise for glucose management, and denies increased appetite, nausea, paresthesia of the feet, polydipsia, polyuria and visual disturbances. Last A1C in the office was:  Lab Results  Component Value Date   HGBA1C 5.6 11/07/2017   Patient is on Vitamin D supplement.   Lab Results  Component Value Date   VD25OH 45 11/07/2017        Current Medications:  Current Outpatient Medications on  File Prior to Visit  Medication Sig  . BABY ASPIRIN PO Take 81 mg by mouth daily.  . Calcium-Vitamin D (CALTRATE 600 PLUS-VIT D PO) Take by mouth daily.  Marland Kitchen desloratadine (CLARINEX) 5 MG tablet TAKE 1 TABLET DAILY FOR    ALLERGIES  . Multiple Vitamins-Minerals (MULTIVITAMIN PO) Take by mouth daily.  Marland Kitchen MYRBETRIQ 50 MG TB24 tablet TAKE 1 TABLET BY MOUTH EVERY DAY  .  Omega-3 Fatty Acids (FISH OIL PO) Take by mouth daily.  . rosuvastatin (CRESTOR) 5 MG tablet Take 1 tablet (5 mg total) by mouth daily.  . promethazine (PHENERGAN) 25 MG tablet Take 1 tablet (25 mg total) by mouth every 6 (six) hours as needed for nausea or vomiting (or motion sickness). Max: 4 tablets per day  . VESICARE 10 MG tablet TAKE 1 TABLET DAILY   No current facility-administered medications on file prior to visit.      Allergies: No Known Allergies   Medical History:  Past Medical History:  Diagnosis Date  . Allergy   . Hyperlipidemia    Family history- Reviewed and unchanged Social history- Reviewed and unchanged   Review of Systems:  Review of Systems  Constitutional: Negative for malaise/fatigue and weight loss.  HENT: Negative for hearing loss and tinnitus.   Eyes: Negative for blurred vision and double vision.  Respiratory: Negative for cough, shortness of breath and wheezing.   Cardiovascular: Negative for chest pain, palpitations, orthopnea, claudication and leg swelling.  Gastrointestinal: Negative for abdominal pain, blood in stool, constipation, diarrhea, heartburn, melena, nausea and vomiting.  Genitourinary: Negative.   Musculoskeletal: Negative for joint pain and myalgias.  Skin: Negative for rash.  Neurological: Negative for dizziness, tingling, sensory change, weakness and headaches.  Endo/Heme/Allergies: Negative for polydipsia.  Psychiatric/Behavioral: Negative.   All other systems reviewed and are negative.     Physical Exam: BP 122/84   Pulse 74   Temp (!) 97.5 F (36.4 C)   Wt 211 lb (95.7 kg)   SpO2 99%   BMI 30.93 kg/m  Wt Readings from Last 3 Encounters:  05/15/18 211 lb (95.7 kg)  11/07/17 209 lb (94.8 kg)  04/11/17 217 lb 9.6 oz (98.7 kg)   General Appearance: Well nourished, in no apparent distress. Eyes: PERRLA, EOMs, conjunctiva no swelling or erythema Sinuses: No Frontal/maxillary tenderness ENT/Mouth: Ext aud canals  clear, TMs without erythema, bulging. No erythema, swelling, or exudate on post pharynx.  Tonsils not swollen or erythematous. Hearing normal.  Neck: Supple, thyroid normal.  Respiratory: Respiratory effort normal, BS equal bilaterally without rales, rhonchi, wheezing or stridor.  Cardio: RRR with no MRGs. Brisk peripheral pulses without edema.  Abdomen: Soft, + BS.  Non tender, no guarding, rebound, hernias, masses. Lymphatics: Non tender without lymphadenopathy.  Musculoskeletal: Full ROM, 5/5 strength, Normal gait Skin: Warm, dry without rashes, lesions, ecchymosis.  Neuro: Cranial nerves intact. No cerebellar symptoms.  Psych: Awake and oriented X 3, normal affect, Insight and Judgment appropriate.    Izora Ribas, NP 9:02 AM [facility]

## 2018-05-15 ENCOUNTER — Other Ambulatory Visit: Payer: Self-pay

## 2018-05-15 ENCOUNTER — Encounter: Payer: Self-pay | Admitting: Adult Health

## 2018-05-15 ENCOUNTER — Ambulatory Visit (INDEPENDENT_AMBULATORY_CARE_PROVIDER_SITE_OTHER): Payer: 59 | Admitting: Adult Health

## 2018-05-15 VITALS — BP 122/84 | HR 74 | Temp 97.5°F | Wt 211.0 lb

## 2018-05-15 DIAGNOSIS — E669 Obesity, unspecified: Secondary | ICD-10-CM

## 2018-05-15 DIAGNOSIS — N3281 Overactive bladder: Secondary | ICD-10-CM

## 2018-05-15 DIAGNOSIS — R0989 Other specified symptoms and signs involving the circulatory and respiratory systems: Secondary | ICD-10-CM | POA: Diagnosis not present

## 2018-05-15 DIAGNOSIS — R7309 Other abnormal glucose: Secondary | ICD-10-CM

## 2018-05-15 DIAGNOSIS — E785 Hyperlipidemia, unspecified: Secondary | ICD-10-CM

## 2018-05-15 DIAGNOSIS — E559 Vitamin D deficiency, unspecified: Secondary | ICD-10-CM

## 2018-05-15 NOTE — Patient Instructions (Signed)
Goals    . LDL CALC < 100    . Weight (lb) < 200 lb (90.7 kg)       Try taking 1/2 tab daily of myrbetriq  Try increasing famotidine to twice daily, raising head of bed     Generally a cough is either coming from above or from below- so we will treat this OR it can be from irritation/viral cough  To treat the nasal drip: Get on the chlorphenirmine every 6 hours- This medication can make you sleepy but helps with nasal drip- get from over the counter.   Can do a steroid nasal spary 1-2 sparys at night each nostril. Remember to spray each nostril twice towards the outer part of your eye.  Do not sniff but instead pinch your nose and tilt your head back to help the medicine get into your sinuses.  The best time to do this is at bedtime. Stop if you get blurred vision or nose bleeds.   To treat the reflux  take prevacid from over the counter twice daily  for 2 weeks- then nightly, or can continue twice a day  To stop irritation: Need to STOP the cough Do sugar free candy Do the tessalon drops VOICE REST is VERY important  If persisting will refer to ENT  Go to the ER or call the office if you get any chest pain, shortness of breath, severe  headache, leg swelling.    Common causes of cough OR hoarseness OR sore throat:   Allergies, Viral Infections, Acid Reflux and Bacterial Infections.    Allergies and viral infections cause a cough OR sore throat by post nasal drip and are often worse at night, can also have sneezing, lower grade fevers, clear/yellow mucus. This is best treated with allergy medications or nasal sprays.  Please get on allegra for 1-2 weeks The strongest is allegra or fexafinadine  Cheapest at [employer], [employer], [employer]   Bacterial infections are more severe than allergies or viral infections with fever, teeth pain, fatigue. This can be treated with prednisone and the same over the counter medication and after 7 days can be treated with an antibiotic.   Silent  reflux/GERD can cause a cough OR sore throat OR hoarseness WITHOUT heart burn because the esophagus that goes to the stomach and trachea that goes to the lungs are very close and when you lay down the acid can irritate your throat and lungs. This can cause hoarseness, cough, and wheezing. Please stop any alcohol or anti-inflammatories like aleve/advil/ibuprofen and start an over the counter Prilosec or omeprazole 1-2 times daily 33mins before food for 2 weeks, then switch to over the counter zantac/ratinidine or pepcid/famotadine once at night for 2 weeks.    sometimes irritation causes more irritation. Try voice rest, use sugar free cough drops to prevent coughing, and try to stop clearing your throat.   If you ever have a cough that does not go away after trying these things please make a follow up visit for further evaluation or we can refer you to a specialist. Or if you ever have shortness of breath or chest pain go to the ER.    Silent reflux: Not all heartburn burns...Marland KitchenMarland KitchenMarland Kitchen  What is LPR? Laryngopharyngeal reflux (LPR) or silent reflux is a condition in which acid that is made in the stomach travels up the esophagus (swallowing tube) and gets to the throat. Not everyone with reflux has a lot of heartburn or indigestion. In fact, many people with  LPR never have heartburn. This is why LPR is called SILENT REFLUX, and the terms "Silent reflux" and "LPR" are often used interchangeably. Because LPR is silent, it is sometimes difficult to diagnose.  How can you tell if you have LPR?  Marland Kitchen Chronic hoarseness- Some people have hoarseness that comes and goes . throat clearing  . Cough . It can cause shortness of breath and cause asthma like symptoms. Marland Kitchen a feeling of a lump in the throat  . difficulty swallowing . a problem with too much nose and throat drainage.  . Some people will feel their esophagus spasm which feels like their heart beating hard and fast, this will usually be after a meal, at rest, or  lying down at night.    How do I treat this? Treatment for LPR should be individualized, and your doctor will suggest the best treatment for you. Generally there are several treatments for LPR: . changing habits and diet to reduce reflux,  . medications to reduce stomach acid, and  . surgery to prevent reflux. Most people with LPR need to modify how and when they eat, as well as take some medication, to get well. Sometimes, nonprescription liquid antacids, such as Maalox, Gelucil and Mylanta are recommended. When used, these antacids should be taken four times each day - one tablespoon one hour after each meal and before bedtime. Dietary and lifestyle changes alone are not often enough to control LPR - medications that reduce stomach acid are also usually needed. These must be prescribed by our doctor.   TIPS FOR REDUCING REFLUX AND LPR Control your LIFE-STYLE and your DIET! Marland Kitchen If you use tobacco, QUIT.  Marland Kitchen Smoking makes you reflux. After every cigarette you have some LPR.  . Don't wear clothing that is too tight, especially around the waist (trousers, corsets, belts).  . Do not lie down just after eating...in fact, do not eat within three hours of bedtime.  . You should be on a low-fat diet.  . Limit your intake of red meat.  . Limit your intake of butter.  Marland Kitchen Avoid fried foods.  . Avoid chocolate  . Avoid cheese.  Marland Kitchen Avoid eggs. Marland Kitchen Specifically avoid caffeine (especially coffee and tea), soda pop (especially cola) and mints.  . Avoid alcoholic beverages, particularly in the evening.       Overactive Bladder, Adult  Overactive bladder refers to a condition in which a person has a sudden need to pass urine. The person may leak urine if he or she cannot get to the bathroom fast enough (urinary incontinence). A person with this condition may also wake up several times in the night to go to the bathroom. Overactive bladder is associated with poor nerve signals between your bladder and  your brain. Your bladder may get the signal to empty before it is full. You may also have very sensitive muscles that make your bladder squeeze too soon. These symptoms might interfere with daily work or social activities. What are the causes? This condition may be associated with or caused by:  Urinary tract infection.  Infection of nearby tissues, such as the prostate.  Prostate enlargement.  Surgery on the uterus or urethra.  Bladder stones, inflammation, or tumors.  Drinking too much caffeine or alcohol.  Certain medicines, especially medicines that get rid of extra fluid in the body (diuretics).  Muscle or nerve weakness, especially from: ? A spinal cord injury. ? Stroke. ? Multiple sclerosis. ? Parkinson's disease.  Diabetes.  Constipation. What  increases the risk? You may be at greater risk for overactive bladder if you:  Are an older adult.  Smoke.  Are going through menopause.  Have prostate problems.  Have a neurological disease, such as stroke, dementia, Parkinson's disease, or multiple sclerosis (MS).  Eat or drink things that irritate the bladder. These include alcohol, spicy food, and caffeine.  Are overweight or obese. What are the signs or symptoms? Symptoms of this condition include:  Sudden, strong urge to urinate.  Leaking urine.  Urinating 8 or more times a day.  Waking up to urinate 2 or more times a night. How is this diagnosed? Your health care provider may suspect overactive bladder based on your symptoms. He or she will diagnose this condition by:  A physical exam and medical history.  Blood or urine tests. You might need bladder or urine tests to help determine what is causing your overactive bladder. You might also need to see a health care provider who specializes in urinary tract problems (urologist). How is this treated? Treatment for overactive bladder depends on the cause of your condition and whether it is mild or severe.  You can also make lifestyle changes at home. Options include:  Bladder training. This may include: ? Learning to control the urge to urinate by following a schedule that directs you to urinate at regular intervals (timed voiding). ? Doing Kegel exercises to strengthen your pelvic floor muscles, which support your bladder. Toning these muscles can help you control urination, even if your bladder muscles are overactive.  Special devices. This may include: ? Biofeedback, which uses sensors to help you become aware of your body's signals. ? Electrical stimulation, which uses electrodes placed inside the body (implanted) or outside the body. These electrodes send gentle pulses of electricity to strengthen the nerves or muscles that control the bladder. ? Women may use a plastic device that fits into the vagina and supports the bladder (pessary).  Medicines. ? Antibiotics to treat bladder infection. ? Antispasmodics to stop the bladder from releasing urine at the wrong time. ? Tricyclic antidepressants to relax bladder muscles. ? Injections of botulinum toxin type A directly into the bladder tissue to relax bladder muscles.  Lifestyle changes. This may include: ? Weight loss. Talk to your health care provider about weight loss methods that would work best for you. ? Diet changes. This may include reducing how much alcohol and caffeine you consume, or drinking fluids at different times of the day. ? Not smoking. Do not use any products that contain nicotine or tobacco, such as cigarettes and e-cigarettes. If you need help quitting, ask your health care provider.  Surgery. ? A device may be implanted to help manage the nerve signals that control urination. ? An electrode may be implanted to stimulate electrical signals in the bladder. ? A procedure may be done to change the shape of the bladder. This is done only in very severe cases. Follow these instructions at home: Lifestyle  Make any diet  or lifestyle changes that are recommended by your health care provider. These may include: ? Drinking less fluid or drinking fluids at different times of the day. ? Cutting down on caffeine or alcohol. ? Doing Kegel exercises. ? Losing weight if needed. ? Eating a healthy and balanced diet to prevent constipation. This may include:  Eating foods that are high in fiber, such as fresh fruits and vegetables, whole grains, and beans.  Limiting foods that are high in fat and processed sugars,  such as fried and sweet foods. General instructions  Take over-the-counter and prescription medicines only as told by your health care provider.  If you were prescribed an antibiotic medicine, take it as told by your health care provider. Do not stop taking the antibiotic even if you start to feel better.  Use any implants or pessary as told by your health care provider.  If needed, wear pads to absorb urine leakage.  Keep a journal or log to track how much and when you drink and when you feel the need to urinate. This will help your health care provider monitor your condition.  Keep all follow-up visits as told by your health care provider. This is important. Contact a health care provider if:  You have a fever.  Your symptoms do not get better with treatment.  Your pain and discomfort get worse.  You have more frequent urges to urinate. Get help right away if:  You are not able to control your bladder. Summary  Overactive bladder refers to a condition in which a person has a sudden need to pass urine.  Several conditions may lead to an overactive bladder.  Treatment for overactive bladder depends on the cause and severity of your condition.  Follow your health care provider's instructions about lifestyle changes, doing Kegel exercises, keeping a journal, and taking medicines. This information is not intended to replace advice given to you by your health care provider. Make sure you  discuss any questions you have with your health care provider. Document Released: 10/15/2008 Document Revised: 01/04/2017 Document Reviewed: 01/04/2017 Elsevier Interactive Patient Education  2019 [employer].

## 2018-05-16 ENCOUNTER — Other Ambulatory Visit: Payer: Self-pay | Admitting: Adult Health

## 2018-05-16 LAB — CBC WITH DIFFERENTIAL/PLATELET
Absolute Monocytes: 391 cells/uL (ref 200–950)
Basophils Absolute: 39 cells/uL (ref 0–200)
Basophils Relative: 0.7 %
Eosinophils Absolute: 193 cells/uL (ref 15–500)
Eosinophils Relative: 3.5 %
HCT: 41.5 % (ref 35.0–45.0)
Hemoglobin: 13.8 g/dL (ref 11.7–15.5)
Lymphs Abs: 1892 cells/uL (ref 850–3900)
MCH: 30.8 pg (ref 27.0–33.0)
MCHC: 33.3 g/dL (ref 32.0–36.0)
MCV: 92.6 fL (ref 80.0–100.0)
MPV: 10.6 fL (ref 7.5–12.5)
Monocytes Relative: 7.1 %
Neutro Abs: 2987 cells/uL (ref 1500–7800)
Neutrophils Relative %: 54.3 %
Platelets: 306 10*3/uL (ref 140–400)
RBC: 4.48 10*6/uL (ref 3.80–5.10)
RDW: 12 % (ref 11.0–15.0)
Total Lymphocyte: 34.4 %
WBC: 5.5 10*3/uL (ref 3.8–10.8)

## 2018-05-16 LAB — COMPLETE METABOLIC PANEL WITH GFR
AG Ratio: 1.6 (calc) (ref 1.0–2.5)
ALT: 20 U/L (ref 6–29)
AST: 22 U/L (ref 10–35)
Albumin: 4.5 g/dL (ref 3.6–5.1)
Alkaline phosphatase (APISO): 67 U/L (ref 37–153)
BUN: 21 mg/dL (ref 7–25)
CO2: 28 mmol/L (ref 20–32)
Calcium: 9.6 mg/dL (ref 8.6–10.4)
Chloride: 104 mmol/L (ref 98–110)
Creat: 0.91 mg/dL (ref 0.50–0.99)
GFR, Est African American: 77 mL/min/{1.73_m2} (ref >=60)
GFR, Est Non African American: 67 mL/min/{1.73_m2} (ref >=60)
Globulin: 2.8 g/dL (calc) (ref 1.9–3.7)
Glucose, Bld: 91 mg/dL (ref 65–99)
Potassium: 5 mmol/L (ref 3.5–5.3)
Sodium: 139 mmol/L (ref 135–146)
Total Bilirubin: 0.6 mg/dL (ref 0.2–1.2)
Total Protein: 7.3 g/dL (ref 6.1–8.1)

## 2018-05-16 LAB — LIPID PANEL
Cholesterol: 186 mg/dL (ref <200)
HDL: 47 mg/dL — ABNORMAL LOW (ref >=50)
LDL Cholesterol (Calc): 104 mg/dL (calc) — ABNORMAL HIGH
Non-HDL Cholesterol (Calc): 139 mg/dL (calc) — ABNORMAL HIGH (ref <130)
Total CHOL/HDL Ratio: 4 (calc) (ref <5.0)
Triglycerides: 229 mg/dL — ABNORMAL HIGH (ref <150)

## 2018-05-16 LAB — TSH: TSH: 1.9 mIU/L (ref 0.40–4.50)

## 2018-09-12 ENCOUNTER — Other Ambulatory Visit: Payer: Self-pay | Admitting: Adult Health

## 2018-11-06 NOTE — Progress Notes (Signed)
Complete Physical  Assessment and Plan:  Encounter for general adult medical examination with abnormal findings  Labile hypertension - continue medications, DASH diet, exercise and monitor at home. Call if greater than 130/80.  - CBC with Differential/Platelet - CMP/GGFR - TSH - Urinalysis, Routine w reflex microscopic (not at [facility]) - Microalbumin / creatinine urine ratio - EKG 12-Lead  Hyperlipidemia -continue medications, titrate rosuvastatin for LDL goal <100 check lipids, decrease fatty foods, increase activity.  - Lipid panel  Other abnormal glucose Discussed general issues about diabetes pathophysiology and management., Educational material distributed., Suggested low cholesterol diet., Encouraged aerobic exercise., Discussed foot care., Reminded to get yearly retinal exam. - Hemoglobin A1c   Obesity, BMI 31 Obesity with co morbidities- long discussion about weight loss, diet, and exercise  Allergy, subsequent encounter Continue OTC meds  Medication management - Magnesium   Vitamin D deficiency - Vit D  25 hydroxy (rtn osteoporosis monitoring)   Incontinence/OAB She reports managing fairly with lifestyle; vesicare worked but was expensive. Myrbetriq not helpful.   Needs for influenza vaccination - administered today  Discussed med's effects and SE's. Screening labs and tests as requested with regular follow-up as recommended. Future Appointments  Date Time Provider [place]  11/20/2018 10:00 AM [name], NP [facility] None    HPI 63 y.o. female  presents for a complete physical. She has Hyperlipidemia; Environmental allergies; Other abnormal glucose; Obesity (BMI 30.0-34.9); Labile hypertension; OAB (overactive bladder); and Vitamin D deficiency on their problem list.  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 Has 3 kids, 5 grandsons and 3 grand daughters. Wants to travel has camper and wants to drive  the [place] and [place]. Mother passed away 2015/04/22 at 31.   She does take famotidine 10 mg PRN for reflux symptoms.   She was on myrbetriq for OAB/incontinence but didn't feel this helped. Previously on vesicare which worked better but cost prohibitive. Feels she is doing fairly without agent at this time. She wears a panty liner, minimal leakage.   BMI is Body mass index is 31.52 kg/m., she has been working on diet and exercise, she is golfing 3 days a week since not going to the gym. Plans to start walking as well.  She has been cutting sugar, avoiding starches. She reports 65+ fluid ounces of water daily. She drinks occasional ice tea with splenda. She eats 5+ servings of fruits vegetables.  Wt Readings from Last 3 Encounters:  11/11/18 215 lb (97.5 kg)  05/15/18 211 lb (95.7 kg)  11/07/17 209 lb (94.8 kg)    Her blood pressure has been controlled at home, today their BP is BP: 128/88 She does workout, golfing 3 days a week and just started to walk . She denies chest pain, shortness of breath, dizziness.   She is on cholesterol medication rosuvastatin 10 mg daily and denies myalgias. Her cholesterol is not at goal. The cholesterol last visit was:   Lab Results  Component Value Date   CHOL 186 05/15/2018   HDL 47 (L) 05/15/2018   LDLCALC 104 (H) 05/15/2018   TRIG 229 (H) 05/15/2018   CHOLHDL 4.0 05/15/2018    She has been working on diet and exercise for glucose management, and denies paresthesia of the feet, polydipsia and polyuria. Last A1C in the office was:  Lab Results  Component Value Date   HGBA1C 5.6 11/07/2017   Lab Results  Component Value Date   GFRNONAA April 21, 2065 05/15/2018  Patient is on Vitamin D supplement, currently taking 2000 IU daily   Lab Results  Component Value Date   VD25OH 45 11/07/2017       Current Medications:  Current Outpatient Medications on File Prior to Visit  Medication Sig Dispense Refill  . BABY ASPIRIN PO Take 81 mg by mouth daily.    .  Calcium-Vitamin D (CALTRATE 600 PLUS-VIT D PO) Take by mouth daily.    . CHOLECALCIFEROL PO Take 2,000 Units by mouth daily.    Marland Kitchen desloratadine (CLARINEX) 5 MG tablet TAKE 1 TABLET DAILY FOR    ALLERGIES 90 tablet 3  . famotidine (PEPCID) 10 MG tablet Take 10 mg by mouth daily.    . Multiple Vitamins-Minerals (MULTIVITAMIN PO) Take by mouth daily.    . Omega-3 Fatty Acids (FISH OIL PO) Take by mouth daily.    . rosuvastatin (CRESTOR) 10 MG tablet TAKE 1 TABLET BY MOUTH EVERY DAY 90 tablet 1  . Zinc 25 MG TABS Take 1 tablet by mouth daily.    Marland Kitchen MYRBETRIQ 50 MG TB24 tablet TAKE 1 TABLET BY MOUTH EVERY DAY (Patient not taking: Reported on 11/11/2018) 90 tablet 1   No current facility-administered medications on file prior to visit.    Health Maintenance:   Immunization History  Administered Date(s) Administered  . Influenza Inj Mdck Quad With Preservative 10/24/2016, 11/07/2017  . Influenza Split 10/20/2014  . Influenza, Seasonal, Injecte, Preservative Fre 10/20/2015  . Tdap 07/13/2009   Tetanus: 2011 Pneumovax: 1997, due in 1 year from [place] 13: DUE - defer to medicare visit  Flu vaccine: 2019, TODAY Zostavax: declines  Pap: 2011 declines had hysterectomy, no further per GYN MGM: 12/2017 DEXA: N/A  Colonoscopy: 2006  Cologuard: 02/2017 neg due 2022 EGD: N/A MRI brain 2002 MRI cervical spine 2002  Last eye exam: [name], last exam 06/2018 Last dental exam: [name], 02/2018, scheduled next Monday  Last derm exam: [name], last exam 2020, goes annually  Patient Care Team: [name], MD as PCP - General (Internal Medicine) [name], MD as Consulting Physician (Orthopedic Surgery) [name], MD as Consulting Physician (Dermatology) [name], [name], MD as Consulting Physician (Gastroenterology)  [name], eye doctor - Feb 2016, wears glasses [facility] q 6 months  Medical History:  Past Medical History:  Diagnosis Date   . Allergy   . Hyperlipidemia    Allergies No Known Allergies  SURGICAL HISTORY She  has a past surgical history that includes Tubal ligation (Bilateral, 1986) and Abdominal hysterectomy (1990). FAMILY HISTORY Her family history includes Arthritis in her father and mother; Diabetes in her father; Heart attack in her paternal grandfather; Hyperlipidemia in her father and mother; Hypertension in her father and mother. SOCIAL HISTORY She  reports that she has never smoked. She has never used smokeless tobacco. She reports that she does not drink alcohol or use drugs.   Review of Systems  Constitutional: Negative.  Negative for malaise/fatigue and weight loss.  HENT: Negative.  Negative for hearing loss and tinnitus.   Eyes: Negative.  Negative for blurred vision and double vision.  Respiratory: Negative.  Negative for cough, sputum production, shortness of breath and wheezing.   Cardiovascular: Negative.  Negative for chest pain, palpitations, orthopnea, claudication, leg swelling and PND.  Gastrointestinal: Negative.  Negative for abdominal pain, blood in stool, constipation, diarrhea, heartburn, melena, nausea and vomiting.  Genitourinary: Negative.   Musculoskeletal: Negative.  Negative for falls, joint pain and myalgias.  Skin:  Negative.  Negative for rash.  Neurological: Negative.  Negative for dizziness, tingling, sensory change, weakness and headaches.  Endo/Heme/Allergies: Negative.  Negative for polydipsia.  Psychiatric/Behavioral: Negative.  Negative for depression, memory loss, substance abuse and suicidal ideas. The patient is not nervous/anxious and does not have insomnia.   All other systems reviewed and are negative.    Physical Exam: Estimated body mass index is 31.52 kg/m as calculated from the following:   Height as of this encounter: 5' 9.25" (1.759 m).   Weight as of this encounter: 215 lb (97.5 kg). BP 128/88   Pulse 78   Temp (!) 97.3 F (36.3 C)   Ht 5'  9.25" (1.759 m)   Wt 215 lb (97.5 kg)   SpO2 97%   BMI 31.52 kg/m  General Appearance: Well nourished, in no apparent distress. Eyes: PERRLA, EOMs, conjunctiva no swelling or erythema, normal fundi and vessels. Sinuses: No Frontal/maxillary tenderness ENT/Mouth: Ext aud canals clear, normal light reflex with TMs without erythema, bulging.  Good dentition. No erythema, swelling, or exudate on post pharynx. Tonsils not swollen or erythematous. Hearing normal.  Neck: Supple, thyroid normal. No bruits Respiratory: Respiratory effort normal, BS equal bilaterally without rales, rhonchi, wheezing or stridor. Cardio: RRR without murmurs, rubs or gallops. Brisk peripheral pulses without edema.  Chest: symmetric, with normal excursions and percussion. Breasts: breasts appear normal, no suspicious masses, no skin or nipple changes or axillary nodes. Abdomen: Soft, +BS. Non tender, no guarding, rebound, hernias, masses, or organomegaly. .  Lymphatics: Non tender without lymphadenopathy.  Genitourinary: defer Musculoskeletal: Full ROM all peripheral extremities,5/5 strength, and normal gait. Skin: Warm, dry without rashes, lesions, ecchymosis.  Neuro: Cranial nerves intact, reflexes equal bilaterally. Normal muscle tone, no cerebellar symptoms. Sensation intact.  Psych: Awake and oriented X 3, normal affect, Insight and Judgment appropriate.   EKG: WNL no ST changes, poss mild LVH, monitor  AORTA SCAN: defer   Gorden Harms Maribel Hadley 9:06 AM

## 2018-11-11 ENCOUNTER — Encounter: Payer: Self-pay | Admitting: Adult Health

## 2018-11-11 ENCOUNTER — Ambulatory Visit (INDEPENDENT_AMBULATORY_CARE_PROVIDER_SITE_OTHER): Payer: Medicare HMO | Admitting: Adult Health

## 2018-11-11 ENCOUNTER — Other Ambulatory Visit: Payer: Self-pay

## 2018-11-11 VITALS — BP 128/88 | HR 78 | Temp 97.3°F | Ht 69.25 in | Wt 215.0 lb

## 2018-11-11 DIAGNOSIS — R0989 Other specified symptoms and signs involving the circulatory and respiratory systems: Secondary | ICD-10-CM

## 2018-11-11 DIAGNOSIS — E66811 Obesity, class 1: Secondary | ICD-10-CM

## 2018-11-11 DIAGNOSIS — E785 Hyperlipidemia, unspecified: Secondary | ICD-10-CM | POA: Diagnosis not present

## 2018-11-11 DIAGNOSIS — Z131 Encounter for screening for diabetes mellitus: Secondary | ICD-10-CM

## 2018-11-11 DIAGNOSIS — Z23 Encounter for immunization: Secondary | ICD-10-CM | POA: Diagnosis not present

## 2018-11-11 DIAGNOSIS — E559 Vitamin D deficiency, unspecified: Secondary | ICD-10-CM

## 2018-11-11 DIAGNOSIS — R7309 Other abnormal glucose: Secondary | ICD-10-CM

## 2018-11-11 DIAGNOSIS — Z136 Encounter for screening for cardiovascular disorders: Secondary | ICD-10-CM

## 2018-11-11 DIAGNOSIS — N3281 Overactive bladder: Secondary | ICD-10-CM

## 2018-11-11 DIAGNOSIS — Z1329 Encounter for screening for other suspected endocrine disorder: Secondary | ICD-10-CM | POA: Diagnosis not present

## 2018-11-11 DIAGNOSIS — Z0001 Encounter for general adult medical examination with abnormal findings: Secondary | ICD-10-CM

## 2018-11-11 DIAGNOSIS — E669 Obesity, unspecified: Secondary | ICD-10-CM

## 2018-11-11 DIAGNOSIS — Z13820 Encounter for screening for osteoporosis: Secondary | ICD-10-CM

## 2018-11-11 DIAGNOSIS — Z9109 Other allergy status, other than to drugs and biological substances: Secondary | ICD-10-CM

## 2018-11-11 DIAGNOSIS — Z833 Family history of diabetes mellitus: Secondary | ICD-10-CM

## 2018-11-11 DIAGNOSIS — Z0000 Encounter for general adult medical examination without abnormal findings: Secondary | ICD-10-CM

## 2018-11-11 DIAGNOSIS — E2839 Other primary ovarian failure: Secondary | ICD-10-CM

## 2018-11-11 DIAGNOSIS — Z6831 Body mass index (BMI) 31.0-31.9, adult: Secondary | ICD-10-CM

## 2018-11-11 NOTE — Patient Instructions (Addendum)
  Kristina Hunter , Thank you for taking time to come for your Annual Wellness Visit. I appreciate your ongoing commitment to your health goals. Please review the following plan we discussed and let me know if I can assist you in the future.   These are the goals we discussed: Goals    . LDL CALC < 100    . Weight (lb) < 200 lb (90.7 kg)       This is a list of the screening recommended for you and due dates:  Health Maintenance  Topic Date Due  . Flu Shot  08/03/2018  . DEXA scan (bone density measurement)  10/09/2018  . Pneumonia vaccines (1 of 2 - PCV13) 10/09/2018  . Tetanus Vaccine  07/14/2019  . Mammogram  12/20/2019  . Cologuard (Stool DNA test)  02/08/2020  .  Hepatitis C: One time screening is recommended by Center for Disease Control  (CDC) for  adults born from [birth year] through 1967.   Completed  . HIV Screening  Completed    Please SCHEDULE A MAMMOGRAM as well as your BONE DENSITY exam   [facility]  7 a.m.-6:30 p.m., Monday 7 a.m.-5 p.m., Tuesday-Friday Schedule an appointment by calling [phone].  [facility] Schedule an appointment by calling [phone].     Know what a healthy weight is for you (roughly BMI <25) and aim to maintain this  Aim for 7+ servings of fruits and vegetables daily  65-80+ fluid ounces of water or unsweet tea for healthy kidneys  Limit to max 1 drink of alcohol per day; avoid smoking/tobacco  Limit animal fats in diet for cholesterol and heart health - choose grass fed whenever available  Avoid highly processed foods, and foods high in saturated/trans fats  Aim for low stress - take time to unwind and care for your mental health  Aim for 150 min of moderate intensity exercise weekly for heart health, and weights twice weekly for bone health  Aim for 7-9 hours of sleep daily       When it comes to diets, agreement about the perfect plan isn't easy to find, even among the experts. Experts  at the [place] developed an idea known as the Healthy Eating Plate. Just imagine a plate divided into logical, healthy portions.  The emphasis is on diet quality:  Load up on vegetables and fruits - one-half of your plate: Aim for color and variety, and remember that potatoes don't count.  Go for whole grains - one-quarter of your plate: Whole wheat, barley, wheat berries, quinoa, oats, brown rice, and foods made with them. If you want pasta, go with whole wheat pasta.  Protein power - one-quarter of your plate: Fish, chicken, beans, and nuts are all healthy, versatile protein sources. Limit red meat.  The diet, however, does go beyond the plate, offering a few other suggestions.  Use healthy plant oils, such as olive, canola, soy, corn, sunflower and peanut. Check the labels, and avoid partially hydrogenated oil, which have unhealthy trans fats.  If you're thirsty, drink water. Coffee and tea are good in moderation, but skip sugary drinks and limit milk and dairy products to one or two daily servings.  The type of carbohydrate in the diet is more important than the amount. Some sources of carbohydrates, such as vegetables, fruits, whole grains, and beans-are healthier than others.  Finally, stay active.

## 2018-11-12 ENCOUNTER — Other Ambulatory Visit: Payer: Self-pay | Admitting: Internal Medicine

## 2018-11-12 DIAGNOSIS — Z0001 Encounter for general adult medical examination with abnormal findings: Secondary | ICD-10-CM | POA: Diagnosis not present

## 2018-11-12 DIAGNOSIS — E559 Vitamin D deficiency, unspecified: Secondary | ICD-10-CM | POA: Diagnosis not present

## 2018-11-12 DIAGNOSIS — R7309 Other abnormal glucose: Secondary | ICD-10-CM | POA: Diagnosis not present

## 2018-11-12 DIAGNOSIS — E785 Hyperlipidemia, unspecified: Secondary | ICD-10-CM | POA: Diagnosis not present

## 2018-11-12 DIAGNOSIS — E669 Obesity, unspecified: Secondary | ICD-10-CM | POA: Diagnosis not present

## 2018-11-12 DIAGNOSIS — R0989 Other specified symptoms and signs involving the circulatory and respiratory systems: Secondary | ICD-10-CM | POA: Diagnosis not present

## 2018-11-12 DIAGNOSIS — Z1329 Encounter for screening for other suspected endocrine disorder: Secondary | ICD-10-CM | POA: Diagnosis not present

## 2018-11-12 DIAGNOSIS — Z1231 Encounter for screening mammogram for malignant neoplasm of breast: Secondary | ICD-10-CM

## 2018-11-12 DIAGNOSIS — Z131 Encounter for screening for diabetes mellitus: Secondary | ICD-10-CM | POA: Diagnosis not present

## 2018-11-12 LAB — CBC WITH DIFFERENTIAL/PLATELET
Absolute Monocytes: 428 cells/uL (ref 200–950)
Basophils Absolute: 57 cells/uL (ref 0–200)
Basophils Relative: 1 %
Eosinophils Absolute: 108 cells/uL (ref 15–500)
Eosinophils Relative: 1.9 %
HCT: 43.1 % (ref 35.0–45.0)
Hemoglobin: 14.5 g/dL (ref 11.7–15.5)
Lymphs Abs: 1647 cells/uL (ref 850–3900)
MCH: 31.7 pg (ref 27.0–33.0)
MCHC: 33.6 g/dL (ref 32.0–36.0)
MCV: 94.1 fL (ref 80.0–100.0)
MPV: 10.3 fL (ref 7.5–12.5)
Monocytes Relative: 7.5 %
Neutro Abs: 3460 cells/uL (ref 1500–7800)
Neutrophils Relative %: 60.7 %
Platelets: 301 10*3/uL (ref 140–400)
RBC: 4.58 10*6/uL (ref 3.80–5.10)
RDW: 11.8 % (ref 11.0–15.0)
Total Lymphocyte: 28.9 %
WBC: 5.7 10*3/uL (ref 3.8–10.8)

## 2018-11-12 LAB — COMPLETE METABOLIC PANEL WITH GFR
AG Ratio: 1.7 (calc) (ref 1.0–2.5)
ALT: 21 U/L (ref 6–29)
AST: 20 U/L (ref 10–35)
Albumin: 4.4 g/dL (ref 3.6–5.1)
Alkaline phosphatase (APISO): 67 U/L (ref 37–153)
BUN: 23 mg/dL (ref 7–25)
CO2: 28 mmol/L (ref 20–32)
Calcium: 9.5 mg/dL (ref 8.6–10.4)
Chloride: 104 mmol/L (ref 98–110)
Creat: 0.88 mg/dL (ref 0.50–0.99)
GFR, Est African American: 80 mL/min/{1.73_m2} (ref >=60)
GFR, Est Non African American: 69 mL/min/{1.73_m2} (ref >=60)
Globulin: 2.6 g/dL (calc) (ref 1.9–3.7)
Glucose, Bld: 99 mg/dL (ref 65–99)
Potassium: 4.8 mmol/L (ref 3.5–5.3)
Sodium: 138 mmol/L (ref 135–146)
Total Bilirubin: 0.6 mg/dL (ref 0.2–1.2)
Total Protein: 7 g/dL (ref 6.1–8.1)

## 2018-11-12 LAB — LIPID PANEL
Cholesterol: 167 mg/dL (ref <200)
HDL: 49 mg/dL — ABNORMAL LOW (ref >=50)
LDL Cholesterol (Calc): 95 mg/dL (calc)
Non-HDL Cholesterol (Calc): 118 mg/dL (calc) (ref <130)
Total CHOL/HDL Ratio: 3.4 (calc) (ref <5.0)
Triglycerides: 132 mg/dL (ref <150)

## 2018-11-12 LAB — HEMOGLOBIN A1C
Hgb A1c MFr Bld: 5.7 % of total Hgb — ABNORMAL HIGH (ref <5.7)
Mean Plasma Glucose: 117 (calc)
eAG (mmol/L): 6.5 (calc)

## 2018-11-12 LAB — MAGNESIUM: Magnesium: 2.2 mg/dL (ref 1.5–2.5)

## 2018-11-12 LAB — TSH: TSH: 1.78 mIU/L (ref 0.40–4.50)

## 2018-11-12 LAB — VITAMIN D 25 HYDROXY (VIT D DEFICIENCY, FRACTURES): Vit D, 25-Hydroxy: 51 ng/mL (ref 30–100)

## 2018-11-13 LAB — URINALYSIS, ROUTINE W REFLEX MICROSCOPIC
Bacteria, UA: NONE SEEN /HPF
Bilirubin Urine: NEGATIVE
Glucose, UA: NEGATIVE
Hgb urine dipstick: NEGATIVE
Hyaline Cast: NONE SEEN /LPF
Ketones, ur: NEGATIVE
Nitrite: NEGATIVE
Protein, ur: NEGATIVE
Specific Gravity, Urine: 1.02 (ref 1.001–1.03)
pH: 6 (ref 5.0–8.0)

## 2018-11-13 LAB — MICROALBUMIN / CREATININE URINE RATIO
Creatinine, Urine: 96 mg/dL (ref 20–275)
Microalb Creat Ratio: 4 mcg/mg creat (ref <30)
Microalb, Ur: 0.4 mg/dL

## 2018-11-20 ENCOUNTER — Encounter: Payer: 59 | Admitting: Adult Health

## 2018-12-14 ENCOUNTER — Other Ambulatory Visit: Payer: Self-pay | Admitting: Internal Medicine

## 2018-12-14 DIAGNOSIS — E782 Mixed hyperlipidemia: Secondary | ICD-10-CM

## 2018-12-14 MED ORDER — ROSUVASTATIN CALCIUM 10 MG PO TABS: ORAL_TABLET | ORAL | 3 refills | Status: DC

## 2019-01-21 DIAGNOSIS — L920 Granuloma annulare: Secondary | ICD-10-CM | POA: Diagnosis not present

## 2019-01-21 DIAGNOSIS — D225 Melanocytic nevi of trunk: Secondary | ICD-10-CM | POA: Diagnosis not present

## 2019-01-21 DIAGNOSIS — L814 Other melanin hyperpigmentation: Secondary | ICD-10-CM | POA: Diagnosis not present

## 2019-01-21 DIAGNOSIS — L820 Inflamed seborrheic keratosis: Secondary | ICD-10-CM | POA: Diagnosis not present

## 2019-01-21 DIAGNOSIS — R208 Other disturbances of skin sensation: Secondary | ICD-10-CM | POA: Diagnosis not present

## 2019-01-21 DIAGNOSIS — L821 Other seborrheic keratosis: Secondary | ICD-10-CM | POA: Diagnosis not present

## 2019-01-31 ENCOUNTER — Ambulatory Visit
Admission: RE | Admit: 2019-01-31 | Discharge: 2019-01-31 | Disposition: A | Discharge status: Home / Self Care | Payer: 59 | Source: Ambulatory Visit | Attending: Adult Health | Admitting: Adult Health

## 2019-01-31 ENCOUNTER — Ambulatory Visit
Admission: RE | Admit: 2019-01-31 | Discharge: 2019-01-31 | Disposition: A | Discharge status: Home / Self Care | Payer: Medicare HMO | Source: Ambulatory Visit | Attending: Internal Medicine | Admitting: Internal Medicine

## 2019-01-31 ENCOUNTER — Other Ambulatory Visit: Payer: Self-pay

## 2019-01-31 DIAGNOSIS — Z1231 Encounter for screening mammogram for malignant neoplasm of breast: Secondary | ICD-10-CM

## 2019-01-31 DIAGNOSIS — Z13820 Encounter for screening for osteoporosis: Secondary | ICD-10-CM | POA: Diagnosis not present

## 2019-01-31 DIAGNOSIS — E2839 Other primary ovarian failure: Secondary | ICD-10-CM

## 2019-02-06 ENCOUNTER — Other Ambulatory Visit: Payer: Self-pay | Admitting: Adult Health

## 2019-02-06 DIAGNOSIS — E782 Mixed hyperlipidemia: Secondary | ICD-10-CM

## 2019-05-06 DIAGNOSIS — K219 Gastro-esophageal reflux disease without esophagitis: Secondary | ICD-10-CM | POA: Insufficient documentation

## 2019-05-06 NOTE — Progress Notes (Deleted)
MEDICARE ANNUAL WELLNESS VISIT AND FOLLOW UP  Assessment:   Diagnoses and all orders for this visit:  Welcome to Medicare preventive visit  Labile hypertension  OAB (overactive bladder)  Vitamin D deficiency  Other abnormal glucose  Obesity (BMI 30.0-34.9)  Hyperlipidemia, unspecified hyperlipidemia type  Environmental allergies  Gastroesophageal reflux disease, unspecified whether esophagitis present      Over 40 minutes of exam, counseling, chart review and critical decision making was performed Future Appointments  Date Time Provider [place]  05/08/2019  9:00 AM [name], NP [facility] None  11/11/2019  9:00 AM [name], NP [facility] None     Plan:   During the course of the visit the patient was educated and counseled about appropriate screening and preventive services including:    Pneumococcal vaccine   Prevnar 13  Influenza vaccine  Td vaccine  Screening electrocardiogram  Bone densitometry screening  Colorectal cancer screening  Diabetes screening  Glaucoma screening  Nutrition counseling   Advanced directives: requested   Subjective:  Kristina Hunter is a 64 y.o. female who presents for Medicare Annual Wellness Visit and 3 month follow up.   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 Has 3 kids, 5 grandsons and 3 grand daughters.  Wants to travel has camper and wants to drive the [place] and [place]. Mother passed away 2015-04-17 at 53.   She does take famotidine 10 mg PRN for reflux symptoms, has had persistent cough ***  She was on myrbetriq for OAB/incontinence but didn't feel this helped. Previously on vesicare which worked better but cost prohibitive.  Feels she is doing fairly without agent at this time. She wears a panty liner, minimal leakage.   BMI is There is no height or weight on file to calculate BMI., she has been working on diet and exercise, she is golfing 3 days  a week since not going to the gym. Plans to start walking as well.  She has been cutting sugar, avoiding starches. She reports 65+ fluid ounces of water daily. She drinks occasional ice tea with splenda. She eats 5+ servings of fruits vegetables.  Wt Readings from Last 3 Encounters:  11/11/18 215 lb (97.5 kg)  05/15/18 211 lb (95.7 kg)  11/07/17 209 lb (94.8 kg)    Her blood pressure {HAS HAS NOT:18834} been controlled at home, today their BP is   She {DOES_DOES [id number] workout. She denies chest pain, shortness of breath, dizziness.   She is on cholesterol medication (rosuvastatin 10 mg daily) and denies myalgias. Her cholesterol is at goal. The cholesterol last visit was:   Lab Results  Component Value Date   CHOL 167 11/11/2018   HDL 49 (L) 11/11/2018   [place] 95 11/11/2018   TRIG 132 11/11/2018   CHOLHDL 3.4 11/11/2018   She has had prediabetes predating 2013/04/16. She {Has/has not:18111} been working on diet and exercise for prediabetes, and denies {Symptoms; diabetes w/o none:19199}. Last A1C in the office was:  Lab Results  Component Value Date   HGBA1C 5.7 (H) 11/11/2018   Last GFR: Lab Results  Component Value Date   GFRNONAA 04/17/2067 11/11/2018   Patient is on Vitamin D supplement.   Lab Results  Component Value Date   VD25OH 51 11/11/2018      Medication Review: Current Outpatient Medications on File Prior to Visit  Medication Sig Dispense Refill  . BABY ASPIRIN PO Take 81 mg by mouth daily.    Marland Kitchen  Calcium-Vitamin D (CALTRATE 600 PLUS-VIT D PO) Take by mouth daily.    . CHOLECALCIFEROL PO Take 2,000 Units by mouth daily.    Marland Kitchen desloratadine (CLARINEX) 5 MG tablet TAKE 1 TABLET DAILY FOR    ALLERGIES 90 tablet 3  . famotidine (PEPCID) 10 MG tablet Take 10 mg by mouth daily.    . Multiple Vitamins-Minerals (MULTIVITAMIN PO) Take by mouth daily.    Marland Kitchen MYRBETRIQ 50 MG TB24 tablet TAKE 1 TABLET BY MOUTH EVERY DAY (Patient not taking: Reported on 11/11/2018) 90 tablet 1  . Omega-3  Fatty Acids (FISH OIL PO) Take by mouth daily.    . rosuvastatin (CRESTOR) 10 MG tablet Take 1 tablet Daily for Cholesterol 90 tablet 1  . Zinc 25 MG TABS Take 1 tablet by mouth daily.     No current facility-administered medications on file prior to visit.    No Known Allergies  Current Problems (verified) Patient Active Problem List   Diagnosis Date Noted  . Vitamin D deficiency 05/14/2018  . OAB (overactive bladder) 11/06/2017  . Other abnormal glucose 10/20/2014  . Obesity (BMI 30.0-34.9) 10/20/2014  . Labile hypertension 10/20/2014  . Hyperlipidemia   . Environmental allergies     Screening Tests Immunization History  Administered Date(s) Administered  . Influenza Inj Mdck Quad With Preservative 10/24/2016, 11/07/2017  . Influenza Split 10/20/2014  . Influenza, High Dose Seasonal PF 11/11/2018  . Influenza, Seasonal, Injecte, Preservative Fre 10/20/2015  . Tdap 07/13/2009   Tetanus: 2011 Pneumovax: 1997, due in 1 year from [place] 13: DUE - *** Flu vaccine: 11/2018 Zostavax: declines covid 19: ***  Pap: 2011 declines had hysterectomy, no further per GYN MGM: 01/2019 DEXA: 01/2019, T -0.5, normal  Colonoscopy: 2006  Cologuard: 02/2017 neg due 2022 EGD: N/A  MRI brain 2002 MRI cervical spine 2002  Names of Other Physician/Practitioners you currently use: 1.  [facility] for [facility] 2. [name], eye doctor, last visit 06/2018 3. [name], dentist, last visit 02/2018, *** 4. [name], derm, last visit 2020, goes annually   Patient Care Team: [name], MD as PCP - General (Internal Medicine) [name], MD as Consulting Physician (Orthopedic Surgery) [name], MD as Consulting Physician (Dermatology) [name], [name], MD as Consulting Physician (Gastroenterology)  SURGICAL HISTORY She  has a past surgical history that includes Tubal ligation (Bilateral, 1986); Abdominal  hysterectomy (1990); and Cervical disc surgery (2000). FAMILY HISTORY Her family history includes Arthritis in her father and mother; Dementia in her mother; Diabetes in her father; Heart attack in her paternal grandfather; Hyperlipidemia in her father and mother; Hypertension in her father and mother. SOCIAL HISTORY She  reports that she has never smoked. She has never used smokeless tobacco. She reports that she does not drink alcohol or use drugs.   MEDICARE WELLNESS OBJECTIVES: Physical activity:   Cardiac risk factors:   Depression/mood screen:   Depression screen [facility] 2/9 11/11/2018  Decreased Interest 0  Down, Depressed, Hopeless 0  PHQ - 2 Score 0    ADLs:  No flowsheet data found.   Cognitive Testing  Alert? Yes  Normal Appearance?Yes  Oriented to person? Yes  Place? Yes   Time? Yes  Recall of three objects?  Yes  Can perform simple calculations? Yes  Displays appropriate judgment?Yes  Can read the correct time from a watch face?Yes  EOL planning:    *** Review of Systems  Constitutional: Negative for malaise/fatigue and weight loss.  HENT: Negative for hearing loss and tinnitus.   Eyes: Negative for blurred vision and double vision.  Respiratory: Negative for cough, sputum production, shortness of breath and wheezing.   Cardiovascular: Negative for chest pain, palpitations, orthopnea, claudication, leg swelling and PND.  Gastrointestinal: Negative for abdominal pain, blood in stool, constipation, diarrhea, heartburn, melena, nausea and vomiting.  Genitourinary: Negative.   Musculoskeletal: Negative for falls, joint pain and myalgias.  Skin: Negative for rash.  Neurological: Negative for dizziness, tingling, sensory change, weakness and headaches.  Endo/Heme/Allergies: Negative for polydipsia.  Psychiatric/Behavioral: Negative.  Negative for depression, memory loss, substance abuse and suicidal ideas. The patient is not nervous/anxious and does not have insomnia.    All other systems reviewed and are negative.    Objective:     There were no vitals filed for this visit. There is no height or weight on file to calculate BMI.  General appearance: alert, no distress, WD/WN, female HEENT: normocephalic, sclerae anicteric, TMs pearly, nares patent, no discharge or erythema, pharynx normal Oral cavity: MMM, no lesions Neck: supple, no lymphadenopathy, no thyromegaly, no masses Heart: RRR, normal S1, S2, no murmurs Lungs: CTA bilaterally, no wheezes, rhonchi, or rales Abdomen: +bs, soft, non tender, non distended, no masses, no hepatomegaly, no splenomegaly Musculoskeletal: nontender, no swelling, no obvious deformity Extremities: no edema, no cyanosis, no clubbing Pulses: 2+ symmetric, upper and lower extremities, normal cap refill Neurological: alert, oriented x 3, CN2-12 intact, strength normal upper extremities and lower extremities, sensation normal throughout, DTRs 2+ throughout, no cerebellar signs, gait normal Psychiatric: normal affect, behavior normal, pleasant   Medicare Attestation I have personally reviewed: The patient's medical and social history Their use of alcohol, tobacco or illicit drugs Their current medications and supplements The patient's functional ability including ADLs,fall risks, home safety risks, cognitive, and hearing and visual impairment Diet and physical activities Evidence for depression or mood disorders  The patient's weight, height, BMI, and visual acuity have been recorded in the chart.  I have made referrals, counseling, and provided education to the patient based on review of the above and I have provided the patient with a written personalized care plan for preventive services.     Izora Ribas, NP   05/06/2019

## 2019-05-08 ENCOUNTER — Ambulatory Visit: Payer: Medicare HMO | Admitting: Adult Health

## 2019-05-08 NOTE — Progress Notes (Deleted)
MEDICARE ANNUAL WELLNESS VISIT AND FOLLOW UP  Assessment:   Diagnoses and all orders for this visit:  Welcome to Medicare preventive visit  Labile hypertension  Gastroesophageal reflux disease, unspecified whether esophagitis present  OAB (overactive bladder)  Hyperlipidemia, unspecified hyperlipidemia type  Vitamin D deficiency  Other abnormal glucose  Obesity (BMI 30.0-34.9)  Environmental allergies      Over 40 minutes of exam, counseling, chart review and critical decision making was performed Future Appointments  Date Time Provider [place]  05/09/2019  9:15 AM [name], NP [facility] None  11/11/2019  9:00 AM [name], NP [facility] None     Plan:   During the course of the visit the patient was educated and counseled about appropriate screening and preventive services including:    Pneumococcal vaccine   Prevnar 13  Influenza vaccine  Td vaccine  Screening electrocardiogram  Bone densitometry screening  Colorectal cancer screening  Diabetes screening  Glaucoma screening  Nutrition counseling   Advanced directives: requested   Subjective:  Kristina Hunter is a 64 y.o. female who presents for Medicare Annual Wellness Visit and 3 month follow up.   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 Has 3 kids, 5 grandsons and 3 grand daughters.  Wants to travel has camper and wants to drive the [place] and [place]. Mother passed away 2015-04-09 at 28.   She does take famotidine 10 mg PRN for reflux symptoms, has had persistent cough ***  She was on myrbetriq for OAB/incontinence but didn't feel this helped. Previously on vesicare which worked better but cost prohibitive.  Feels she is doing fairly without agent at this time. She wears a panty liner, minimal leakage.   BMI is There is no height or weight on file to calculate BMI., she has been working on diet and exercise, she is golfing 3 days  a week since not going to the gym. Plans to start walking as well.  She has been cutting sugar, avoiding starches. She reports 65+ fluid ounces of water daily. She drinks occasional ice tea with splenda. She eats 5+ servings of fruits vegetables.  Wt Readings from Last 3 Encounters:  11/11/18 215 lb (97.5 kg)  05/15/18 211 lb (95.7 kg)  11/07/17 209 lb (94.8 kg)    Her blood pressure {HAS HAS NOT:18834} been controlled at home, today their BP is   She {DOES_DOES [id number]:[id number] workout. She denies chest pain, shortness of breath, dizziness.   She is on cholesterol medication (rosuvastatin 10 mg daily) and denies myalgias. Her cholesterol is at goal. The cholesterol last visit was:   Lab Results  Component Value Date   CHOL 167 11/11/2018   HDL 49 (L) 11/11/2018   [place] 95 11/11/2018   TRIG 132 11/11/2018   CHOLHDL 3.4 11/11/2018   She has had prediabetes predating 08-Apr-2013. She {Has/has not:18111} been working on diet and exercise for prediabetes, and denies {Symptoms; diabetes w/o none:19199}. Last A1C in the office was:  Lab Results  Component Value Date   HGBA1C 5.7 (H) 11/11/2018   Last GFR: Lab Results  Component Value Date   GFRNONAA 04/09/2067 11/11/2018   Patient is on Vitamin D supplement.   Lab Results  Component Value Date   VD25OH 51 11/11/2018      Medication Review: Current Outpatient Medications on File Prior to Visit  Medication Sig Dispense Refill  . BABY ASPIRIN PO Take 81 mg by mouth daily.    Marland Kitchen  Calcium-Vitamin D (CALTRATE 600 PLUS-VIT D PO) Take by mouth daily.    . CHOLECALCIFEROL PO Take 2,000 Units by mouth daily.    Marland Kitchen desloratadine (CLARINEX) 5 MG tablet TAKE 1 TABLET DAILY FOR    ALLERGIES 90 tablet 3  . famotidine (PEPCID) 10 MG tablet Take 10 mg by mouth daily.    . Multiple Vitamins-Minerals (MULTIVITAMIN PO) Take by mouth daily.    . Omega-3 Fatty Acids (FISH OIL PO) Take by mouth daily.    . rosuvastatin (CRESTOR) 10 MG tablet Take 1 tablet Daily for  Cholesterol 90 tablet 1  . Zinc 25 MG TABS Take 1 tablet by mouth daily.     No current facility-administered medications on file prior to visit.    No Known Allergies  Current Problems (verified) Patient Active Problem List   Diagnosis Date Noted  . Acid reflux 05/06/2019  . Vitamin D deficiency 05/14/2018  . OAB (overactive bladder) 11/06/2017  . Other abnormal glucose 10/20/2014  . Obesity (BMI 30.0-34.9) 10/20/2014  . Labile hypertension 10/20/2014  . Hyperlipidemia   . Environmental allergies     Screening Tests Immunization History  Administered Date(s) Administered  . Influenza Inj Mdck Quad With Preservative 10/24/2016, 11/07/2017  . Influenza Split 10/20/2014  . Influenza, High Dose Seasonal PF 11/11/2018  . Influenza, Seasonal, Injecte, Preservative Fre 10/20/2015  . Tdap 07/13/2009   Tetanus: 2011 Pneumovax: 1997, due in 1 year from [place] 13: DUE - *** Flu vaccine: 11/2018 Zostavax: declines covid 19: ***  Pap: 2011 declines had hysterectomy, no further per GYN MGM: 01/2019 DEXA: 01/2019, T -0.5, normal  Colonoscopy: 2006  Cologuard: 02/2017 neg due 2022 EGD: N/A  MRI brain 2002 MRI cervical spine 2002  Names of Other Physician/Practitioners you currently use: 1. [facility] here for primary care 2. [name], eye doctor, last visit 06/2018 3. [name], dentist, last visit 02/2018, *** 4. [name], derm, last visit 2020, goes annually   Patient Care Team: [name], MD as PCP - General (Internal Medicine) [name], MD as Consulting Physician (Orthopedic Surgery) [name], MD as Consulting Physician (Dermatology) [name], [name], MD as Consulting Physician (Gastroenterology)  SURGICAL HISTORY She  has a past surgical history that includes Tubal ligation (Bilateral, 1986); Abdominal hysterectomy (1990); and Cervical disc surgery (2000). FAMILY HISTORY Her family  history includes Arthritis in her father and mother; Dementia in her mother; Diabetes in her father; Heart attack in her paternal grandfather; Hyperlipidemia in her father and mother; Hypertension in her father and mother. SOCIAL HISTORY She  reports that she has never smoked. She has never used smokeless tobacco. She reports that she does not drink alcohol or use drugs.   MEDICARE WELLNESS OBJECTIVES: Physical activity:   Cardiac risk factors:   Depression/mood screen:   Depression screen [facility] 2/9 11/11/2018  Decreased Interest 0  Down, Depressed, Hopeless 0  PHQ - 2 Score 0    ADLs:  No flowsheet data found.   Cognitive Testing  Alert? Yes  Normal Appearance?Yes  Oriented to person? Yes  Place? Yes   Time? Yes  Recall of three objects?  Yes  Can perform simple calculations? Yes  Displays appropriate judgment?Yes  Can read the correct time from a watch face?Yes  EOL planning:    *** Review of Systems  Constitutional: Negative for malaise/fatigue and weight loss.  HENT: Negative for hearing loss and tinnitus.   Eyes: Negative for blurred vision and double vision.  Respiratory: Negative for cough, sputum production, shortness of breath and wheezing.   Cardiovascular: Negative for chest pain, palpitations, orthopnea, claudication, leg swelling and PND.  Gastrointestinal: Negative for abdominal pain, blood in stool, constipation, diarrhea, heartburn, melena, nausea and vomiting.  Genitourinary: Negative.   Musculoskeletal: Negative for falls, joint pain and myalgias.  Skin: Negative for rash.  Neurological: Negative for dizziness, tingling, sensory change, weakness and headaches.  Endo/Heme/Allergies: Negative for polydipsia.  Psychiatric/Behavioral: Negative.  Negative for depression, memory loss, substance abuse and suicidal ideas. The patient is not nervous/anxious and does not have insomnia.   All other systems reviewed and are negative.    Objective:     There were no  vitals filed for this visit. There is no height or weight on file to calculate BMI.  General appearance: alert, no distress, WD/WN, female HEENT: normocephalic, sclerae anicteric, TMs pearly, nares patent, no discharge or erythema, pharynx normal Oral cavity: MMM, no lesions Neck: supple, no lymphadenopathy, no thyromegaly, no masses Heart: RRR, normal S1, S2, no murmurs Lungs: CTA bilaterally, no wheezes, rhonchi, or rales Abdomen: +bs, soft, non tender, non distended, no masses, no hepatomegaly, no splenomegaly Musculoskeletal: nontender, no swelling, no obvious deformity Extremities: no edema, no cyanosis, no clubbing Pulses: 2+ symmetric, upper and lower extremities, normal cap refill Neurological: alert, oriented x 3, CN2-12 intact, strength normal upper extremities and lower extremities, sensation normal throughout, DTRs 2+ throughout, no cerebellar signs, gait normal Psychiatric: normal affect, behavior normal, pleasant   Medicare Attestation I have personally reviewed: The patient's medical and social history Their use of alcohol, tobacco or illicit drugs Their current medications and supplements The patient's functional ability including ADLs,fall risks, home safety risks, cognitive, and hearing and visual impairment Diet and physical activities Evidence for depression or mood disorders  The patient's weight, height, BMI, and visual acuity have been recorded in the chart.  I have made referrals, counseling, and provided education to the patient based on review of the above and I have provided the patient with a written personalized care plan for preventive services.     Izora Ribas, NP   05/08/2019

## 2019-05-09 ENCOUNTER — Ambulatory Visit: Payer: Medicare HMO | Admitting: Adult Health

## 2019-05-09 ENCOUNTER — Telehealth: Payer: Self-pay | Admitting: *Deleted

## 2019-05-09 NOTE — Telephone Encounter (Signed)
Patient cancelled appointment today, due to having diarrhea. Per Rayford Halsted, a message was left for the patient to drink plenty of fluids and suggested she may need a Covid test, since diarrhea is a symptom of Covid.

## 2019-05-09 NOTE — Progress Notes (Addendum)
MEDICARE ANNUAL WELLNESS VISIT AND FOLLOW UP  Assessment:   Kristina Hunter was seen today for medicare wellness and follow-up.  Diagnoses and all orders for this visit:  Welcome to Medicare preventive visit -     EKG 12-Lead -     [place], RETROPERITNL ABD,  LTD  Labile hypertension Improved with lifestyle Monitor blood pressure at home; call if consistently over 130/80 Continue DASH diet.   Reminder to go to the ER if any CP, SOB, nausea, dizziness, severe HA, changes vision/speech, left arm numbness and tingling and jaw pain. -     CBC with Differential/Platelet -     COMPLETE METABOLIC PANEL WITH GFR -     EKG 12-Lead -     [place], RETROPERITNL ABD,  LTD  Gastroesophageal reflux disease, unspecified whether esophagitis present Well managed on current medications Discussed diet, avoiding triggers and other lifestyle changes  OAB (overactive bladder) Mild symptoms managed by lifestyle; declines meds at this time  Vitamin D deficiency continue to recommend supplementation for goal of 60-100 Defer vitamin D level   Other abnormal glucose Prediabetes Discussed disease and risks Discussed diet/exercise, weight management  A1C q78m -     COMPLETE METABOLIC PANEL WITH GFR -     Hemoglobin A1c  Obesity (BMI 30.0-34.9) Long discussion about weight loss, diet, and exercise Recommended diet heavy in fruits and veggies and low in animal meats, cheeses, and dairy products, appropriate calorie intake Discussed appropriate weight for heightand initial goal (<200 lb) Follow up at next visit  Hyperlipidemia, unspecified hyperlipidemia type Continue medication Continue low cholesterol diet and exercise.  Check lipid panel.  -     Lipid panel -     TSH  Environmental allergies Avoid triggers, hygiene reviewed, continue antihistamine   Abnormal EKG Leftward axis, ? LVH changes BP well controlled; no concerning sx Will obtain CXR, if EKG changes persistent consider ECHO  Over 40 minutes of  exam, counseling, chart review and critical decision making was performed Future Appointments  Date Time Provider [place]  11/11/2019  9:00 AM [name], NP [facility] None     Plan:   During the course of the visit the patient was educated and counseled about appropriate screening and preventive services including:    Pneumococcal vaccine   Prevnar 13  Influenza vaccine  Td vaccine  Screening electrocardiogram  Bone densitometry screening  Colorectal cancer screening  Diabetes screening  Glaucoma screening  Nutrition counseling   Advanced directives: requested   Subjective:  Kristina Hunter is a 64 y.o. female who presents for Medicare Annual Wellness Visit and 3 month follow up.   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 Has 3 kids, 5 grandsons and 3 grand daughters. Wants to travel has camper and wants to drive the [place] and [place]. Mother passed away 04/22/2015 at 66.   She does take famotidine 10 mg PRN for reflux symptoms, has improved persistent cough.   She was on myrbetriq for OAB/incontinence but didn't feel this helped. Previously on vesicare which worked better but cost prohibitive. Feels she is doing fairly without agent at this time. She wears a panty liner, minimal leakage.   BMI is Body mass index is 32.55 kg/m., she has been working on diet and exercise, she is golfing 4 hours 3 days a week since not going to the gym. Plans to start walking as well.  She has been cutting sugar, avoiding starches. She reports 65+ fluid ounces of  water daily. She drinks occasional ice tea with splenda. She eats 5+ servings of fruits vegetables.  Wt Readings from Last 3 Encounters:  05/12/19 222 lb (100.7 kg)  11/11/18 215 lb (97.5 kg)  05/15/18 211 lb (95.7 kg)    Her blood pressure has been controlled at home, today their BP is BP: 114/80 She does workout. She denies chest pain, shortness of breath, dizziness.   She is on  cholesterol medication (rosuvastatin 10 mg daily) and denies myalgias. Her cholesterol is at goal. The cholesterol last visit was:   Lab Results  Component Value Date   CHOL 167 11/11/2018   HDL 49 (L) 11/11/2018   [place] 95 11/11/2018   TRIG 132 11/11/2018   CHOLHDL 3.4 11/11/2018   She has had prediabetes predating 2015. She has been working on diet and exercise for prediabetes, and denies increased appetite, nausea, paresthesia of the feet, polydipsia, polyuria, visual disturbances, vomiting and weight loss. Last A1C in the office was:  Lab Results  Component Value Date   HGBA1C 5.7 (H) 11/11/2018   Reports 64+ fluid ounces minimum daily. Last GFR: Lab Results  Component Value Date   GFRNONAA 69 11/11/2018   Patient is on Vitamin D supplement.   Lab Results  Component Value Date   VD25OH 51 11/11/2018      Medication Review: Current Outpatient Medications on File Prior to Visit  Medication Sig Dispense Refill  . BABY ASPIRIN PO Take 81 mg by mouth daily.    . Calcium-Vitamin D (CALTRATE 600 PLUS-VIT D PO) Take by mouth daily.    . CHOLECALCIFEROL PO Take 2,000 Units by mouth daily.    Marland Kitchen desloratadine (CLARINEX) 5 MG tablet TAKE 1 TABLET DAILY FOR    ALLERGIES 90 tablet 3  . famotidine (PEPCID) 10 MG tablet Take 10 mg by mouth daily.    . Multiple Vitamins-Minerals (MULTIVITAMIN PO) Take by mouth daily.    . Omega-3 Fatty Acids (FISH OIL PO) Take by mouth daily.    . rosuvastatin (CRESTOR) 10 MG tablet Take 1 tablet Daily for Cholesterol 90 tablet 1  . Zinc 25 MG TABS Take 1 tablet by mouth daily.     No current facility-administered medications on file prior to visit.    No Known Allergies  Current Problems (verified) Patient Active Problem List   Diagnosis Date Noted  . Acid reflux 05/06/2019  . Vitamin D deficiency 05/14/2018  . OAB (overactive bladder) 11/06/2017  . Other abnormal glucose 10/20/2014  . Obesity (BMI 30.0-34.9) 10/20/2014  . Labile hypertension  10/20/2014  . Hyperlipidemia   . Environmental allergies     Screening Tests Immunization History  Administered Date(s) Administered  . Influenza Inj Mdck Quad With Preservative 10/24/2016, 11/07/2017  . Influenza Split 10/20/2014  . Influenza, High Dose Seasonal PF 11/11/2018  . Influenza, Seasonal, Injecte, Preservative Fre 10/20/2015  . Tdap 07/13/2009   Tetanus: 2011 Pneumovax: 1997, due in 1 year from [place] 13: DUE -  Flu vaccine: 11/2018 Zostavax: declines  covid 19: declines at this time   Pap: 2011 declines had hysterectomy, no further per GYN MGM: 01/2019 DEXA: 01/2019, T -0.5, normal  Colonoscopy: 2006  Cologuard: 02/2017 neg due 2022 EGD: N/A  MRI brain 2002 MRI cervical spine 2002  Names of Other Physician/Practitioners you currently use: 1. [facility] here for primary care 2. [name], eye doctor, last visit 06/2018, will schedule  3. [name], dentist, last visit 11/2018, will be  changing due to new insurance  4. [name], derm, last visit 2021, goes annually   Patient Care Team: [name], MD as PCP - General (Internal Medicine) [name], MD as Consulting Physician (Orthopedic Surgery) [name], MD as Consulting Physician (Dermatology) [name], [name], MD as Consulting Physician (Gastroenterology)  SURGICAL HISTORY She  has a past surgical history that includes Tubal ligation (Bilateral, 1986); Abdominal hysterectomy (1990); and Cervical disc surgery (2000). FAMILY HISTORY Her family history includes Arthritis in her father and mother; Dementia in her mother; Diabetes in her father; Heart attack in her paternal grandfather; Hyperlipidemia in her father and mother; Hypertension in her father and mother. SOCIAL HISTORY She  reports that she has never smoked. She has never used smokeless tobacco. She reports that she does not drink alcohol or use drugs.   MEDICARE  WELLNESS OBJECTIVES: Physical activity: Current Exercise Habits: Home exercise routine, Type of exercise: Other - see comments(golfing), Time (Minutes): 60, Frequency (Times/Week): 4, Weekly Exercise (Minutes/Week): 240, Intensity: Mild, Exercise limited by: None identified Cardiac risk factors: Cardiac Risk Factors include: advanced age (>41men, >14 women);dyslipidemia;hypertension;obesity (BMI >30kg/m2) Depression/mood screen:   Depression screen [facility] 2/9 05/12/2019  Decreased Interest 0  Down, Depressed, Hopeless 0  PHQ - 2 Score 0    ADLs:  In your present state of health, do you have any difficulty performing the following activities: 05/12/2019  Hearing? N  Vision? N  Difficulty concentrating or making decisions? N  Walking or climbing stairs? N  Dressing or bathing? N  Doing errands, shopping? N  Some recent data might be hidden     Cognitive Testing  Alert? Yes  Normal Appearance?Yes  Oriented to person? Yes  Place? Yes   Time? Yes  Recall of three objects?  Yes  Can perform simple calculations? Yes  Displays appropriate judgment?Yes  Can read the correct time from a watch face?Yes  EOL planning: Does Patient Have a Medical Advance Directive?: No Would patient like information on creating a medical advance directive?: Yes (MAU/Ambulatory/Procedural Areas - Information given)  Review of Systems  Constitutional: Negative for malaise/fatigue and weight loss.  HENT: Negative for hearing loss and tinnitus.   Eyes: Negative for blurred vision and double vision.  Respiratory: Negative for cough, sputum production, shortness of breath and wheezing.   Cardiovascular: Negative for chest pain, palpitations, orthopnea, claudication, leg swelling and PND.  Gastrointestinal: Negative for abdominal pain, blood in stool, constipation, diarrhea, heartburn, melena, nausea and vomiting.  Genitourinary: Negative.   Musculoskeletal: Negative for falls, joint pain and myalgias.  Skin:  Negative for rash.  Neurological: Negative for dizziness, tingling, sensory change, weakness and headaches.  Endo/Heme/Allergies: Positive for environmental allergies. Negative for polydipsia.  Psychiatric/Behavioral: Negative.  Negative for depression, memory loss, substance abuse and suicidal ideas. The patient is not nervous/anxious and does not have insomnia.   All other systems reviewed and are negative.    Objective:     Today's Vitals   05/12/19 1056  BP: 114/80  Pulse: 74  Resp: 16  Temp: (!) 97.2 F (36.2 C)  SpO2: 97%  Weight: 222 lb (100.7 kg)  Height: 5' 9.25" (1.759 m)   Body mass index is 32.55 kg/m.  General appearance: alert, no distress, WD/WN, female HEENT: normocephalic, sclerae anicteric, TMs pearly, nares patent, no discharge or erythema, pharynx normal Oral cavity: MMM, no lesions Neck: supple, no lymphadenopathy, no thyromegaly, no masses Heart: RRR, normal S1, S2, no murmurs Lungs: CTA bilaterally, no wheezes, rhonchi, or rales Abdomen: +  bs, soft, non tender, non distended, no masses, no hepatomegaly, no splenomegaly Musculoskeletal: nontender, no swelling, no obvious deformity Extremities: no edema, no cyanosis, no clubbing Pulses: 2+ symmetric, upper and lower extremities, normal cap refill Neurological: alert, oriented x 3, CN2-12 intact, strength normal upper extremities and lower extremities, sensation normal throughout, DTRs 2+ throughout, no cerebellar signs, gait normal Psychiatric: normal affect, behavior normal, pleasant   Medicare Attestation I have personally reviewed: The patient's medical and social history Their use of alcohol, tobacco or illicit drugs Their current medications and supplements The patient's functional ability including ADLs,fall risks, home safety risks, cognitive, and hearing and visual impairment Diet and physical activities Evidence for depression or mood disorders  The patient's weight, height, BMI, and visual  acuity have been recorded in the chart.  I have made referrals, counseling, and provided education to the patient based on review of the above and I have provided the patient with a written personalized care plan for preventive services.     Izora Ribas, NP   05/12/2019

## 2019-05-12 ENCOUNTER — Encounter: Payer: Self-pay | Admitting: Adult Health

## 2019-05-12 ENCOUNTER — Other Ambulatory Visit: Payer: Self-pay

## 2019-05-12 ENCOUNTER — Ambulatory Visit (INDEPENDENT_AMBULATORY_CARE_PROVIDER_SITE_OTHER): Payer: Medicare HMO | Admitting: Adult Health

## 2019-05-12 VITALS — BP 114/80 | HR 74 | Temp 97.2°F | Resp 16 | Ht 69.25 in | Wt 222.0 lb

## 2019-05-12 DIAGNOSIS — K219 Gastro-esophageal reflux disease without esophagitis: Secondary | ICD-10-CM

## 2019-05-12 DIAGNOSIS — Z9109 Other allergy status, other than to drugs and biological substances: Secondary | ICD-10-CM

## 2019-05-12 DIAGNOSIS — R0989 Other specified symptoms and signs involving the circulatory and respiratory systems: Secondary | ICD-10-CM

## 2019-05-12 DIAGNOSIS — E785 Hyperlipidemia, unspecified: Secondary | ICD-10-CM | POA: Diagnosis not present

## 2019-05-12 DIAGNOSIS — R6889 Other general symptoms and signs: Secondary | ICD-10-CM

## 2019-05-12 DIAGNOSIS — Z8249 Family history of ischemic heart disease and other diseases of the circulatory system: Secondary | ICD-10-CM

## 2019-05-12 DIAGNOSIS — R7309 Other abnormal glucose: Secondary | ICD-10-CM | POA: Diagnosis not present

## 2019-05-12 DIAGNOSIS — Z136 Encounter for screening for cardiovascular disorders: Secondary | ICD-10-CM | POA: Diagnosis not present

## 2019-05-12 DIAGNOSIS — R9431 Abnormal electrocardiogram [ECG] [EKG]: Secondary | ICD-10-CM

## 2019-05-12 DIAGNOSIS — Z0001 Encounter for general adult medical examination with abnormal findings: Secondary | ICD-10-CM | POA: Diagnosis not present

## 2019-05-12 DIAGNOSIS — Z0000 Encounter for general adult medical examination without abnormal findings: Secondary | ICD-10-CM

## 2019-05-12 DIAGNOSIS — E669 Obesity, unspecified: Secondary | ICD-10-CM

## 2019-05-12 DIAGNOSIS — E559 Vitamin D deficiency, unspecified: Secondary | ICD-10-CM | POA: Diagnosis not present

## 2019-05-12 DIAGNOSIS — N3281 Overactive bladder: Secondary | ICD-10-CM

## 2019-05-12 NOTE — Addendum Note (Signed)
Addended by: Izora Ribas on: 05/12/2019 01:12 PM   Modules accepted: Orders

## 2019-05-12 NOTE — Patient Instructions (Addendum)
Kristina Hunter , Thank you for taking time to come for your Medicare Wellness Visit. I appreciate your ongoing commitment to your health goals. Please review the following plan we discussed and let me know if I can assist you in the future.   These are the goals we discussed: Goals    . LDL CALC < 100    . Weight (lb) < 200 lb (90.7 kg)       This is a list of the screening recommended for you and due dates:  Health Maintenance  Topic Date Due  . COVID-19 Vaccine (1) 05/28/2019*  . Pneumonia vaccines (1 of 2 - PCV13) 05/11/2020*  . Tetanus Vaccine  07/14/2019  . Flu Shot  08/03/2019  . Cologuard (Stool DNA test)  02/08/2020  . Mammogram  01/30/2021  . DEXA scan (bone density measurement)  Completed  .  Hepatitis C: One time screening is recommended by Center for Disease Control  (CDC) for  adults born from [birth year] through 1967.   Completed  . HIV Screening  Completed  *Topic was postponed. The date shown is not the original due date.    Please check with insurance about tetanus coverage - or come see [place] if any cut in yard or step on nail  Consider pneumonia vaccine -    Pneumococcal Conjugate Vaccine (PCV13): What You Need to Know 1. Why get vaccinated? Pneumococcal conjugate vaccine (PCV13) can prevent pneumococcal disease. Pneumococcal disease refers to any illness caused by pneumococcal bacteria. These bacteria can cause many types of illnesses, including pneumonia, which is an infection of the lungs. Pneumococcal bacteria are one of the most common causes of pneumonia. Besides pneumonia, pneumococcal bacteria can also cause:  Ear infections  Sinus infections  Meningitis (infection of the tissue covering the brain and spinal cord)  Bacteremia (bloodstream infection) Anyone can get pneumococcal disease, but children under 23 years of age, people with certain medical conditions, adults 44 years or older, and cigarette smokers are at the highest risk. Most pneumococcal  infections are mild. However, some can result in long-term problems, such as brain damage or hearing loss. Meningitis, bacteremia, and pneumonia caused by pneumococcal disease can be fatal. 2. PCV13 PCV13 protects against 13 types of bacteria that cause pneumococcal disease. Infants and young children usually need 4 doses of pneumococcal conjugate vaccine, at 2, 4, 6, and 32-28 months of age. In some cases, a child might need fewer than 4 doses to complete PCV13 vaccination. A dose of PCV23 vaccine is also recommended for anyone 2 years or older with certain medical conditions if they did not already receive PCV13. This vaccine may be given to adults 65 years or older based on discussions between the patient and health care provider. 3. Talk with your health care provider Tell your vaccine provider if the person getting the vaccine:  Has had an allergic reaction after a previous dose of PCV13, to an earlier pneumococcal conjugate vaccine known as PCV7, or to any vaccine containing diphtheria toxoid (for example, DTaP), or has any severe, life-threatening allergies.  In some cases, your health care provider may decide to postpone PCV13 vaccination to a future visit. People with minor illnesses, such as a cold, may be vaccinated. People who are moderately or severely ill should usually wait until they recover before getting PCV13. Your health care provider can give you more information. 4. Risks of a vaccine reaction  Redness, swelling, pain, or tenderness where the shot is given, and fever, loss of  appetite, fussiness (irritability), feeling tired, headache, and chills can happen after PCV13. Young children may be at increased risk for seizures caused by fever after PCV13 if it is administered at the same time as inactivated influenza vaccine. Ask your health care provider for more information. People sometimes faint after medical procedures, including vaccination. Tell your provider if you feel  dizzy or have vision changes or ringing in the ears. As with any medicine, there is a very remote chance of a vaccine causing a severe allergic reaction, other serious injury, or death. 5. What if there is a serious problem? An allergic reaction could occur after the vaccinated person leaves the clinic. If you see signs of a severe allergic reaction (hives, swelling of the face and throat, difficulty breathing, a fast heartbeat, dizziness, or weakness), call 9-1-1 and get the person to the nearest hospital. For other signs that concern you, call your health care provider. Adverse reactions should be reported to the Vaccine Adverse Event Reporting System (VAERS). Your health care provider will usually file this report, or you can do it yourself. Visit the VAERS website at www.vaers.SamedayNews.es or call (678)589-4275. VAERS is only for reporting reactions, and VAERS staff do not give medical advice. 6. The National Vaccine Injury Compensation Program The [employer] Vaccine Injury Compensation Program (VICP) is a federal program that was created to compensate people who may have been injured by certain vaccines. Visit the VICP website at GoldCloset.com.ee or call 804 865 0433 to learn about the program and about filing a claim. There is a time limit to file a claim for compensation. 7. How can I learn more?  Ask your health care provider.  Call your local or state health department.  Contact the Centers for Disease Control and Prevention (CDC): ? Call 386-806-2881 (1-800-CDC-INFO) or ? Visit CDC's website at http://hunter.com/ Vaccine Information Statement PCV13 Vaccine (10/31/2017) This information is not intended to replace advice given to you by your health care provider. Make sure you discuss any questions you have with your health care provider. Document Revised: 04/09/2018 Document Reviewed: 07/31/2017 Elsevier Patient Education  [place].

## 2019-05-13 ENCOUNTER — Other Ambulatory Visit: Payer: Self-pay | Admitting: Adult Health

## 2019-05-13 ENCOUNTER — Encounter: Payer: Self-pay | Admitting: Adult Health

## 2019-05-13 DIAGNOSIS — K760 Fatty (change of) liver, not elsewhere classified: Secondary | ICD-10-CM | POA: Insufficient documentation

## 2019-05-13 DIAGNOSIS — R7989 Other specified abnormal findings of blood chemistry: Secondary | ICD-10-CM

## 2019-05-13 LAB — LIPID PANEL
Cholesterol: 121 mg/dL (ref <200)
HDL: 39 mg/dL — ABNORMAL LOW (ref >=50)
LDL Cholesterol (Calc): 58 mg/dL (calc)
Non-HDL Cholesterol (Calc): 82 mg/dL (calc) (ref <130)
Total CHOL/HDL Ratio: 3.1 (calc) (ref <5.0)
Triglycerides: 159 mg/dL — ABNORMAL HIGH (ref <150)

## 2019-05-13 LAB — CBC WITH DIFFERENTIAL/PLATELET
Absolute Monocytes: 431 cells/uL (ref 200–950)
Basophils Absolute: 29 cells/uL (ref 0–200)
Basophils Relative: 0.6 %
Eosinophils Absolute: 172 cells/uL (ref 15–500)
Eosinophils Relative: 3.5 %
HCT: 40.6 % (ref 35.0–45.0)
Hemoglobin: 13.7 g/dL (ref 11.7–15.5)
Lymphs Abs: 1617 cells/uL (ref 850–3900)
MCH: 31.5 pg (ref 27.0–33.0)
MCHC: 33.7 g/dL (ref 32.0–36.0)
MCV: 93.3 fL (ref 80.0–100.0)
MPV: 10.1 fL (ref 7.5–12.5)
Monocytes Relative: 8.8 %
Neutro Abs: 2651 cells/uL (ref 1500–7800)
Neutrophils Relative %: 54.1 %
Platelets: 275 10*3/uL (ref 140–400)
RBC: 4.35 10*6/uL (ref 3.80–5.10)
RDW: 11.8 % (ref 11.0–15.0)
Total Lymphocyte: 33 %
WBC: 4.9 10*3/uL (ref 3.8–10.8)

## 2019-05-13 LAB — COMPLETE METABOLIC PANEL WITH GFR
AG Ratio: 1.7 (calc) (ref 1.0–2.5)
ALT: 43 U/L — ABNORMAL HIGH (ref 6–29)
AST: 39 U/L — ABNORMAL HIGH (ref 10–35)
Albumin: 4 g/dL (ref 3.6–5.1)
Alkaline phosphatase (APISO): 62 U/L (ref 37–153)
BUN: 15 mg/dL (ref 7–25)
CO2: 30 mmol/L (ref 20–32)
Calcium: 9.1 mg/dL (ref 8.6–10.4)
Chloride: 105 mmol/L (ref 98–110)
Creat: 0.87 mg/dL (ref 0.50–0.99)
GFR, Est African American: 81 mL/min/{1.73_m2} (ref >=60)
GFR, Est Non African American: 70 mL/min/{1.73_m2} (ref >=60)
Globulin: 2.4 g/dL (calc) (ref 1.9–3.7)
Glucose, Bld: 93 mg/dL (ref 65–99)
Potassium: 4.1 mmol/L (ref 3.5–5.3)
Sodium: 140 mmol/L (ref 135–146)
Total Bilirubin: 0.4 mg/dL (ref 0.2–1.2)
Total Protein: 6.4 g/dL (ref 6.1–8.1)

## 2019-05-13 LAB — HEMOGLOBIN A1C
Hgb A1c MFr Bld: 5.5 % of total Hgb (ref <5.7)
Mean Plasma Glucose: 111 (calc)
eAG (mmol/L): 6.2 (calc)

## 2019-05-13 LAB — TSH: TSH: 1.21 mIU/L (ref 0.40–4.50)

## 2019-06-04 ENCOUNTER — Ambulatory Visit
Admission: RE | Admit: 2019-06-04 | Discharge: 2019-06-04 | Disposition: A | Discharge status: Home / Self Care | Payer: Medicare HMO | Source: Ambulatory Visit | Attending: Adult Health | Admitting: Adult Health

## 2019-06-04 DIAGNOSIS — R7989 Other specified abnormal findings of blood chemistry: Secondary | ICD-10-CM

## 2019-06-04 DIAGNOSIS — R0989 Other specified symptoms and signs involving the circulatory and respiratory systems: Secondary | ICD-10-CM

## 2019-06-04 DIAGNOSIS — R9431 Abnormal electrocardiogram [ECG] [EKG]: Secondary | ICD-10-CM

## 2019-06-04 DIAGNOSIS — I10 Essential (primary) hypertension: Secondary | ICD-10-CM | POA: Diagnosis not present

## 2019-06-04 NOTE — Progress Notes (Signed)
PATIENT IS AWARE OF CHEST X RAY RESULTS. -E WELCH

## 2019-06-05 ENCOUNTER — Encounter: Payer: Self-pay | Admitting: Adult Health

## 2019-07-25 DIAGNOSIS — H524 Presbyopia: Secondary | ICD-10-CM | POA: Diagnosis not present

## 2019-08-11 DIAGNOSIS — Z0100 Encounter for examination of eyes and vision without abnormal findings: Secondary | ICD-10-CM | POA: Diagnosis not present

## 2019-08-11 DIAGNOSIS — L920 Granuloma annulare: Secondary | ICD-10-CM | POA: Diagnosis not present

## 2019-10-08 ENCOUNTER — Other Ambulatory Visit: Payer: Self-pay | Admitting: Internal Medicine

## 2019-10-08 DIAGNOSIS — E782 Mixed hyperlipidemia: Secondary | ICD-10-CM

## 2019-11-10 NOTE — Progress Notes (Signed)
Complete Physical  Assessment and Plan:  Encounter for general adult medical examination with abnormal findings Declines pneumonia vaccine today; will check with insurance for shingrix  Labile hypertension - continue medications, DASH diet, exercise and monitor at home. Call if greater than 130/80.  - CBC with Differential/Platelet - CMP/GGFR - TSH - Urinalysis, Routine w reflex microscopic (not at [facility]) - Microalbumin / creatinine urine ratio - EKG 12-Lead  Hyperlipidemia -continue medications, titrate rosuvastatin for LDL goal <100 check lipids, decrease fatty foods, increase activity.  - Lipid panel  Other abnormal glucose Discussed general issues about diabetes pathophysiology and management., Educational material distributed., Suggested low cholesterol diet., Encouraged aerobic exercise., Discussed foot care., Reminded to get yearly retinal exam. - Hemoglobin A1c   Obesity, BMI 32 Obesity with co morbidities- long discussion about weight loss, diet, and exercise Weight loss goal of <200 lb set  Hepatic steatosis Weight loss advised, avoid alcohol/tylenol, will monitor LFTs  Allergy, subsequent encounter Continue OTC meds  Medication management - Magnesium   Vitamin D deficiency - Vit D  25 hydroxy (rtn osteoporosis monitoring)   Incontinence/OAB She reports managing fairly with lifestyle; vesicare worked but was expensive.  Myrbetriq not helpful. Kegal's discussed  Need for influenza vaccine High dose quadrivalent administered without complication today    Discussed med's effects and SE's. Screening labs and tests as requested with regular follow-up as recommended. Future Appointments  Date Time Provider [place]  05/19/2020 11:15 AM [name], NP [facility] None  11/11/2020  9:00 AM [name], NP [facility] None    HPI 64 y.o. female  presents for a complete physical. She has Hyperlipidemia; Environmental allergies; Other abnormal  glucose; Obesity (BMI 30.0-34.9); Labile hypertension; OAB (overactive bladder); Vitamin D deficiency; Acid reflux; and Hepatic steatosis on their problem list.  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 Has 3 kids, 5 grandsons and 3 grand daughters. Wants to travel has camper and wants to drive the [place] and [place].   She does take famotidine 10 mg daily PM with improved AM cough.   She was on myrbetriq for OAB/incontinence but didn't feel this helped. Previously on vesicare which worked better but cost prohibitive. Feels she is doing fairly without agent at this time. She wears a panty liner, minimal leakage.   BMI is Body mass index is 32.81 kg/m., she has been working on diet and exercise, she is golfing 3 days a week since not going to the gym. Plans to start walking as well.  She has been cutting sugar, avoiding starches. She reports 65+ fluid ounces of water daily. She drinks occasional ice tea with splenda. She eats 5+ servings of fruits vegetables.  Wt Readings from Last 3 Encounters:  11/11/19 222 lb 3.2 oz (100.8 kg)  05/12/19 222 lb (100.7 kg)  11/11/18 215 lb (97.5 kg)    Her blood pressure has been controlled at home, today their BP is BP: 118/80 She does workout, does golfing 3 days a week. She denies chest pain, shortness of breath, dizziness.   She is on cholesterol medication rosuvastatin 10 mg daily and denies myalgias. Her cholesterol is not at goal. The cholesterol last visit was:   Lab Results  Component Value Date   CHOL 121 05/12/2019   HDL 39 (L) 05/12/2019   LDLCALC 58 05/12/2019   TRIG 159 (H) 05/12/2019   CHOLHDL 3.1 05/12/2019    She has been working on diet and exercise for glucose management,  and denies paresthesia of the feet, polydipsia and polyuria. Last A1C in the office was:  Lab Results  Component Value Date   HGBA1C 5.5 05/12/2019   Lab Results  Component Value Date   GFRNONAA 70 05/12/2019   Patient is  on Vitamin D supplement, currently taking 2000 IU daily   Lab Results  Component Value Date   VD25OH 51 11/11/2018      Current Medications:  Current Outpatient Medications on File Prior to Visit  Medication Sig Dispense Refill  . BABY ASPIRIN PO Take 81 mg by mouth daily.    . Calcium-Vitamin D (CALTRATE 600 PLUS-VIT D PO) Take by mouth daily.    . CHOLECALCIFEROL PO Take 2,000 Units by mouth daily.    Marland Kitchen desloratadine (CLARINEX) 5 MG tablet TAKE 1 TABLET DAILY FOR    ALLERGIES 90 tablet 3  . famotidine (PEPCID) 10 MG tablet Take 10 mg by mouth daily.    . Multiple Vitamins-Minerals (MULTIVITAMIN PO) Take by mouth daily.    . Omega-3 Fatty Acids (FISH OIL PO) Take by mouth daily.    . rosuvastatin (CRESTOR) 10 MG tablet Take     1 tablet      Daily      for Cholesterol 90 tablet 3  . Zinc 25 MG TABS Take 1 tablet by mouth daily.     No current facility-administered medications on file prior to visit.   Health Maintenance:   Immunization History  Administered Date(s) Administered  . Influenza Inj Mdck Quad With Preservative 10/24/2016, 11/07/2017  . Influenza Split 10/20/2014  . Influenza, High Dose Seasonal PF 11/11/2018  . Influenza, Seasonal, Injecte, Preservative Fre 10/20/2015  . Tdap 07/13/2009   Tetanus: 2011 - DUE, will check with insurance,  Pneumovax: 1997, due in 1 year from [place] [facility]: declines today, will think about  Flu vaccine: 11/2018, TODAY  Shingrix: wants to check with insurance  covid 19: declines at this time   Pap: 2011 declines had hysterectomy, no further per GYN MGM: 01/2019 DEXA: 01/2019, T -0.5, normal  Colonoscopy: 2006  Cologuard: 02/2017 neg due 02/2020 - will send in to hold  EGD: N/A  MRI brain 2002 MRI cervical spine 2002  Names of Other Physician/Practitioners you currently use: 1. [facility] here for primary care 2. [name], eye doctor, last visit 2021 3. [name], dentist,  last visit 2020, will be changing due to new insurance, has scheduled with new next week  4. [name], derm, last visit 2021, goes annually   Patient Care Team: [name], MD as PCP - General (Internal Medicine) [name], MD as Consulting Physician (Orthopedic Surgery) [name], MD as Consulting Physician (Dermatology) [name], [name], MD as Consulting Physician (Gastroenterology)  [name], eye doctor - Feb 2016, wears glasses [facility] q 6 months  Medical History:  Past Medical History:  Diagnosis Date  . Allergy   . Cervical disc disorder    s/p fusion   . Hyperlipidemia    Allergies No Known Allergies  SURGICAL HISTORY She  has a past surgical history that includes Tubal ligation (Bilateral, 1986); Abdominal hysterectomy (1990); and Cervical disc surgery (2000). FAMILY HISTORY Her family history includes Arthritis in her father and mother; Dementia in her mother; Diabetes in her father; Heart attack in her paternal grandfather; Hyperlipidemia in her father and mother; Hypertension in her father and mother. SOCIAL HISTORY She  reports that she has never smoked. She has never  used smokeless tobacco. She reports that she does not drink alcohol and does not use drugs.   Review of Systems  Constitutional: Negative.  Negative for malaise/fatigue and weight loss.  HENT: Negative.  Negative for hearing loss and tinnitus.   Eyes: Negative.  Negative for blurred vision and double vision.  Respiratory: Negative.  Negative for cough, sputum production, shortness of breath and wheezing.   Cardiovascular: Negative.  Negative for chest pain, palpitations, orthopnea, claudication, leg swelling and PND.  Gastrointestinal: Negative.  Negative for abdominal pain, blood in stool, constipation, diarrhea, heartburn, melena, nausea and vomiting.  Genitourinary: Negative.   Musculoskeletal: Negative.  Negative for falls, joint pain and myalgias.  Skin:  Negative.  Negative for rash.  Neurological: Negative.  Negative for dizziness, tingling, sensory change, weakness and headaches.  Endo/Heme/Allergies: Negative.  Negative for polydipsia.  Psychiatric/Behavioral: Negative.  Negative for depression, memory loss, substance abuse and suicidal ideas. The patient is not nervous/anxious and does not have insomnia.   All other systems reviewed and are negative.    Physical Exam: Estimated body mass index is 32.81 kg/m as calculated from the following:   Height as of this encounter: 5\' 9"  (1.753 m).   Weight as of this encounter: 222 lb 3.2 oz (100.8 kg). BP 118/80   Pulse 74   Temp (!) 97.2 F (36.2 C)   Ht 5\' 9"  (1.753 m)   Wt 222 lb 3.2 oz (100.8 kg)   SpO2 96%   BMI 32.81 kg/m  General Appearance: Well nourished, in no apparent distress. Eyes: PERRLA, EOMs, conjunctiva no swelling or erythema Sinuses: No Frontal/maxillary tenderness ENT/Mouth: Ext aud canals clear, normal light reflex with TMs without erythema, bulging.  Good dentition. No erythema, swelling, or exudate on post pharynx. Tonsils not swollen or erythematous. Hearing normal.  Neck: Supple, thyroid normal. No bruits Respiratory: Respiratory effort normal, BS equal bilaterally without rales, rhonchi, wheezing or stridor. Cardio: RRR without murmurs, rubs or gallops. Brisk peripheral pulses without edema.  Chest: symmetric, with normal excursions and percussion. Breasts: breasts appear normal, no suspicious masses, no skin or nipple changes or axillary nodes, symmetric fibrous changes in both upper outer quadrants, fairly dense throughout. Abdomen: Soft, +BS. Non tender, no guarding, rebound, hernias, masses, or organomegaly. .  Lymphatics: Non tender without lymphadenopathy.  Genitourinary: defer Musculoskeletal: Full ROM all peripheral extremities,5/5 strength, and normal gait. Skin: Warm, dry without rashes, lesions, ecchymosis.  Neuro: Cranial nerves intact, reflexes  equal bilaterally. Normal muscle tone, no cerebellar symptoms. Sensation intact.  Psych: Awake and oriented X 3, normal affect, Insight and Judgment appropriate.   EKG: WNL no ST changes, poss mild LVH, monitor  CXR 06/2019 - no cardiomegaly    Gorden Harms Kayon Dozier 9:18 AM

## 2019-11-11 ENCOUNTER — Ambulatory Visit (INDEPENDENT_AMBULATORY_CARE_PROVIDER_SITE_OTHER): Payer: Medicare HMO | Admitting: Adult Health

## 2019-11-11 ENCOUNTER — Encounter: Payer: Self-pay | Admitting: Adult Health

## 2019-11-11 ENCOUNTER — Other Ambulatory Visit: Payer: Self-pay

## 2019-11-11 VITALS — BP 118/80 | HR 74 | Temp 97.2°F | Ht 69.0 in | Wt 222.2 lb

## 2019-11-11 DIAGNOSIS — R7309 Other abnormal glucose: Secondary | ICD-10-CM | POA: Diagnosis not present

## 2019-11-11 DIAGNOSIS — E559 Vitamin D deficiency, unspecified: Secondary | ICD-10-CM

## 2019-11-11 DIAGNOSIS — Z131 Encounter for screening for diabetes mellitus: Secondary | ICD-10-CM

## 2019-11-11 DIAGNOSIS — R0989 Other specified symptoms and signs involving the circulatory and respiratory systems: Secondary | ICD-10-CM

## 2019-11-11 DIAGNOSIS — Z136 Encounter for screening for cardiovascular disorders: Secondary | ICD-10-CM | POA: Diagnosis not present

## 2019-11-11 DIAGNOSIS — Z9109 Other allergy status, other than to drugs and biological substances: Secondary | ICD-10-CM

## 2019-11-11 DIAGNOSIS — Z1329 Encounter for screening for other suspected endocrine disorder: Secondary | ICD-10-CM

## 2019-11-11 DIAGNOSIS — Z23 Encounter for immunization: Secondary | ICD-10-CM | POA: Diagnosis not present

## 2019-11-11 DIAGNOSIS — Z1211 Encounter for screening for malignant neoplasm of colon: Secondary | ICD-10-CM

## 2019-11-11 DIAGNOSIS — Z0000 Encounter for general adult medical examination without abnormal findings: Secondary | ICD-10-CM | POA: Diagnosis not present

## 2019-11-11 DIAGNOSIS — E785 Hyperlipidemia, unspecified: Secondary | ICD-10-CM | POA: Diagnosis not present

## 2019-11-11 DIAGNOSIS — N3281 Overactive bladder: Secondary | ICD-10-CM

## 2019-11-11 DIAGNOSIS — K219 Gastro-esophageal reflux disease without esophagitis: Secondary | ICD-10-CM | POA: Diagnosis not present

## 2019-11-11 DIAGNOSIS — E669 Obesity, unspecified: Secondary | ICD-10-CM

## 2019-11-11 DIAGNOSIS — Z1389 Encounter for screening for other disorder: Secondary | ICD-10-CM

## 2019-11-11 DIAGNOSIS — K760 Fatty (change of) liver, not elsewhere classified: Secondary | ICD-10-CM | POA: Diagnosis not present

## 2019-11-11 DIAGNOSIS — R8290 Unspecified abnormal findings in urine: Secondary | ICD-10-CM

## 2019-11-11 NOTE — Addendum Note (Signed)
Addended by: Chancy Hurter on: 11/11/2019 10:55 AM   Modules accepted: Orders

## 2019-11-11 NOTE — Patient Instructions (Addendum)
  Kristina Hunter , Thank you for taking time to come for your Annual Wellness Visit. I appreciate your ongoing commitment to your health goals. Please review the following plan we discussed and let me know if I can assist you in the future.   These are the goals we discussed: Goals    . Exercise 150 min/wk Moderate Activity    . LDL CALC < 100    . Weight (lb) < 200 lb (90.7 kg)       This is a list of the screening recommended for you and due dates:  Health Maintenance  Topic Date Due  . COVID-19 Vaccine (1) Never done  . Tetanus Vaccine  07/14/2019  . Flu Shot  08/03/2019  . Pneumonia vaccines (1 of 2 - PCV13) 05/11/2020*  . Cologuard (Stool DNA test)  02/08/2020  . Mammogram  01/30/2021  . DEXA scan (bone density measurement)  Completed  .  Hepatitis C: One time screening is recommended by Center for Disease Control  (CDC) for  adults born from [birth year] through 1967.   Completed  *Topic was postponed. The date shown is not the original due date.     Check with insurance about shingrix -      Know what a healthy weight is for you (roughly BMI <25) and aim to maintain this  Aim for 7+ servings of fruits and vegetables daily  65-80+ fluid ounces of water or unsweet tea for healthy kidneys  Limit to max 1 drink of alcohol per day; avoid smoking/tobacco  Limit animal fats in diet for cholesterol and heart health - choose grass fed whenever available  Avoid highly processed foods, and foods high in saturated/trans fats  Aim for low stress - take time to unwind and care for your mental health  Aim for 150 min of moderate intensity exercise weekly for heart health, and weights twice weekly for bone health  Aim for 7-9 hours of sleep daily     Know what a healthy weight is for you (roughly BMI <25) and aim to maintain this  Aim for 7+ servings of fruits and vegetables daily  65-80+ fluid ounces of water or unsweet tea for healthy kidneys  Limit to max 1 drink of alcohol  per day; avoid smoking/tobacco  Limit animal fats in diet for cholesterol and heart health - choose grass fed whenever available  Avoid highly processed foods, and foods high in saturated/trans fats  Aim for low stress - take time to unwind and care for your mental health  Aim for 150 min of moderate intensity exercise weekly for heart health, and weights twice weekly for bone health  Aim for 7-9 hours of sleep daily

## 2019-11-12 ENCOUNTER — Other Ambulatory Visit: Payer: Medicare HMO

## 2019-11-12 ENCOUNTER — Other Ambulatory Visit: Payer: Self-pay

## 2019-11-12 DIAGNOSIS — R8290 Unspecified abnormal findings in urine: Secondary | ICD-10-CM | POA: Diagnosis not present

## 2019-11-12 DIAGNOSIS — Z79899 Other long term (current) drug therapy: Secondary | ICD-10-CM

## 2019-11-12 LAB — CBC WITH DIFFERENTIAL/PLATELET
Absolute Monocytes: 422 cells/uL (ref 200–950)
Basophils Absolute: 37 cells/uL (ref 0–200)
Basophils Relative: 0.6 %
Eosinophils Absolute: 112 cells/uL (ref 15–500)
Eosinophils Relative: 1.8 %
HCT: 43.4 % (ref 35.0–45.0)
Hemoglobin: 14.8 g/dL (ref 11.7–15.5)
Lymphs Abs: 1655 cells/uL (ref 850–3900)
MCH: 31.8 pg (ref 27.0–33.0)
MCHC: 34.1 g/dL (ref 32.0–36.0)
MCV: 93.1 fL (ref 80.0–100.0)
MPV: 9.9 fL (ref 7.5–12.5)
Monocytes Relative: 6.8 %
Neutro Abs: 3974 cells/uL (ref 1500–7800)
Neutrophils Relative %: 64.1 %
Platelets: 303 10*3/uL (ref 140–400)
RBC: 4.66 10*6/uL (ref 3.80–5.10)
RDW: 11.8 % (ref 11.0–15.0)
Total Lymphocyte: 26.7 %
WBC: 6.2 10*3/uL (ref 3.8–10.8)

## 2019-11-12 LAB — URINALYSIS, ROUTINE W REFLEX MICROSCOPIC
Bilirubin Urine: NEGATIVE
Glucose, UA: NEGATIVE
Hyaline Cast: NONE SEEN /LPF
Ketones, ur: NEGATIVE
Nitrite: POSITIVE — AB
Specific Gravity, Urine: 1.019 (ref 1.001–1.03)
WBC, UA: >=60 /HPF — AB (ref 0–5)
pH: 5.5 (ref 5.0–8.0)

## 2019-11-12 LAB — TSH: TSH: 2.21 mIU/L (ref 0.40–4.50)

## 2019-11-12 LAB — COMPLETE METABOLIC PANEL WITH GFR
AG Ratio: 1.6 (calc) (ref 1.0–2.5)
ALT: 23 U/L (ref 6–29)
AST: 20 U/L (ref 10–35)
Albumin: 4.4 g/dL (ref 3.6–5.1)
Alkaline phosphatase (APISO): 74 U/L (ref 37–153)
BUN: 23 mg/dL (ref 7–25)
CO2: 25 mmol/L (ref 20–32)
Calcium: 9.5 mg/dL (ref 8.6–10.4)
Chloride: 103 mmol/L (ref 98–110)
Creat: 0.92 mg/dL (ref 0.50–0.99)
GFR, Est African American: 75 mL/min/{1.73_m2} (ref >=60)
GFR, Est Non African American: 65 mL/min/{1.73_m2} (ref >=60)
Globulin: 2.7 g/dL (calc) (ref 1.9–3.7)
Glucose, Bld: 95 mg/dL (ref 65–99)
Potassium: 4.9 mmol/L (ref 3.5–5.3)
Sodium: 138 mmol/L (ref 135–146)
Total Bilirubin: 0.5 mg/dL (ref 0.2–1.2)
Total Protein: 7.1 g/dL (ref 6.1–8.1)

## 2019-11-12 LAB — LIPID PANEL
Cholesterol: 175 mg/dL (ref <200)
HDL: 49 mg/dL — ABNORMAL LOW (ref >=50)
LDL Cholesterol (Calc): 99 mg/dL (calc)
Non-HDL Cholesterol (Calc): 126 mg/dL (calc) (ref <130)
Total CHOL/HDL Ratio: 3.6 (calc) (ref <5.0)
Triglycerides: 169 mg/dL — ABNORMAL HIGH (ref <150)

## 2019-11-12 LAB — HEMOGLOBIN A1C
Hgb A1c MFr Bld: 5.7 % of total Hgb — ABNORMAL HIGH (ref <5.7)
Mean Plasma Glucose: 117 (calc)
eAG (mmol/L): 6.5 (calc)

## 2019-11-12 LAB — VITAMIN D 25 HYDROXY (VIT D DEFICIENCY, FRACTURES): Vit D, 25-Hydroxy: 52 ng/mL (ref 30–100)

## 2019-11-12 LAB — MAGNESIUM: Magnesium: 2.3 mg/dL (ref 1.5–2.5)

## 2019-11-12 NOTE — Addendum Note (Signed)
Addended by: Izora Ribas on: 11/12/2019 08:53 AM   Modules accepted: Orders

## 2019-11-15 LAB — URINE CULTURE
MICRO NUMBER:: 11186793
SPECIMEN QUALITY:: ADEQUATE

## 2019-11-17 ENCOUNTER — Other Ambulatory Visit: Payer: Self-pay | Admitting: Adult Health

## 2019-11-17 MED ORDER — NITROFURANTOIN MONOHYD MACRO 100 MG PO CAPS: 100.0000 mg | ORAL_CAPSULE | Freq: Two times a day (BID) | ORAL | 0 refills | Status: DC

## 2019-12-11 ENCOUNTER — Other Ambulatory Visit: Payer: Self-pay | Admitting: Internal Medicine

## 2019-12-11 DIAGNOSIS — Z1231 Encounter for screening mammogram for malignant neoplasm of breast: Secondary | ICD-10-CM

## 2020-01-29 ENCOUNTER — Ambulatory Visit: Payer: Medicare HMO

## 2020-02-02 ENCOUNTER — Ambulatory Visit: Payer: Medicare HMO

## 2020-02-17 DIAGNOSIS — D225 Melanocytic nevi of trunk: Secondary | ICD-10-CM | POA: Diagnosis not present

## 2020-02-17 DIAGNOSIS — Z85828 Personal history of other malignant neoplasm of skin: Secondary | ICD-10-CM | POA: Diagnosis not present

## 2020-02-17 DIAGNOSIS — L814 Other melanin hyperpigmentation: Secondary | ICD-10-CM | POA: Diagnosis not present

## 2020-02-17 DIAGNOSIS — L920 Granuloma annulare: Secondary | ICD-10-CM | POA: Diagnosis not present

## 2020-02-17 DIAGNOSIS — L821 Other seborrheic keratosis: Secondary | ICD-10-CM | POA: Diagnosis not present

## 2020-02-17 DIAGNOSIS — L905 Scar conditions and fibrosis of skin: Secondary | ICD-10-CM | POA: Diagnosis not present

## 2020-03-03 DIAGNOSIS — Z1211 Encounter for screening for malignant neoplasm of colon: Secondary | ICD-10-CM | POA: Diagnosis not present

## 2020-03-04 LAB — COLOGUARD: Cologuard: NEGATIVE

## 2020-03-10 LAB — COLOGUARD: COLOGUARD: NEGATIVE

## 2020-03-11 ENCOUNTER — Encounter: Payer: Self-pay | Admitting: Internal Medicine

## 2020-03-11 ENCOUNTER — Ambulatory Visit
Admission: RE | Admit: 2020-03-11 | Discharge: 2020-03-11 | Disposition: A | Discharge status: Home / Self Care | Payer: Medicare HMO | Source: Ambulatory Visit | Attending: Internal Medicine | Admitting: Internal Medicine

## 2020-03-11 ENCOUNTER — Other Ambulatory Visit: Payer: Self-pay

## 2020-03-11 DIAGNOSIS — Z1231 Encounter for screening mammogram for malignant neoplasm of breast: Secondary | ICD-10-CM

## 2020-03-12 ENCOUNTER — Ambulatory Visit: Payer: Medicare HMO

## 2020-03-16 ENCOUNTER — Encounter: Payer: Self-pay | Admitting: Internal Medicine

## 2020-03-16 NOTE — Progress Notes (Signed)
History of Present Illness:       This very nice 65 y.o.  MWF presents for 3 -4 month follow up with hx /o labile HTN, HLD, Pre-Diabetes and Vitamin D Deficiency.        Patient is followed expectantly for labile HTN  having been treated in the remote past with HCTZ.  Recent BP's  have been controlled and today's BP is at goal - 132/84. Patient has had no complaints of any cardiac type chest pain, palpitations, dyspnea / orthopnea / PND, dizziness, claudication, or dependent edema.       Hyperlipidemia is controlled with diet & Rosuvaststin. Patient denies myalgias or other med SE's. Last Lipids were at goal with marginally elevated Trig's:  Lab Results  Component Value Date   CHOL 175 11/11/2019   HDL 49 (L) 11/11/2019   LDLCALC 99 11/11/2019   TRIG 169 (H) 11/11/2019   CHOLHDL 3.6 11/11/2019      Also, the patient has history of PreDiabetes (A1c 6.3% /2012) and has had no symptoms of reactive hypoglycemia, diabetic polys, paresthesias or visual blurring.  Last A1c was still borderline:  Lab Results  Component Value Date   HGBA1C 5.7 (H) 11/11/2019    Further, the patient also has history of Vitamin D Deficiency ("42 " on Tx /2016)  and supplements vitamin D without any suspected side-effects. Last vitamin D was still slightly low (goal 70-100):  Lab Results  Component Value Date   VD25OH 52 11/11/2019    Current Outpatient Medications on File Prior to Visit  Medication Sig  . BABY ASPIRIN PO Take 81 mg by mouth daily.  Marland Kitchen CALTRATE 600 +VIT D Take  daily.  . CHOLECALCIFEROL 2,000 u Take  \daily.  Marland Kitchen desloratadine  5 MG tablet TAKE 1 TABLET DAILY FOR  ALLERGIES  . Famotidine  10 MG tablet Take  daily.  . MultiVit-Minerals  Take  daily.  . Omega-3 FISH OIL  Take daily.  . rosuvastatin  10 MG tablet Take 1 tablet  Daily for Cholesterol  . Zinc 25 MG TABS Take 1 tablet  daily.    No Known Allergies  PMHx:   Past Medical History:  Diagnosis Date  . Allergy   .  Cervical disc disorder    s/p fusion   . Hyperlipidemia     Immunization History  Administered Date(s) Administered  . Influenza Inj Mdck Quad With Preservative 10/24/2016, 11/07/2017  . Influenza Split 10/20/2014  . Influenza, High Dose Seasonal PF 11/11/2018, 11/11/2019  . Influenza, Seasonal, Injecte, Preservative Fre 10/20/2015  . Tdap 07/13/2009    Past Surgical History:  Procedure Laterality Date  . ABDOMINAL HYSTERECTOMY  1990   with left salpingooopherectomy  . [place] SURGERY  2000  . TUBAL LIGATION Bilateral 1986   BTL    FHx:    Reviewed / unchanged  SHx:    Reviewed / unchanged   Systems Review:  Constitutional: Denies fever, chills, wt changes, headaches, insomnia, fatigue, night sweats, change in appetite. Eyes: Denies redness, blurred vision, diplopia, discharge, itchy, watery eyes.  ENT: Denies discharge, congestion, post nasal drip, epistaxis, sore throat, earache, hearing loss, dental pain, tinnitus, vertigo, sinus pain, snoring.  CV: Denies chest pain, palpitations, irregular heartbeat, syncope, dyspnea, diaphoresis, orthopnea, PND, claudication or edema. Respiratory: denies cough, dyspnea, DOE, pleurisy, hoarseness, laryngitis, wheezing.  Gastrointestinal: Denies dysphagia, odynophagia, heartburn, reflux, water brash, abdominal pain or cramps, nausea, vomiting, bloating, diarrhea, constipation, hematemesis, melena, hematochezia  or  hemorrhoids. Genitourinary: Denies dysuria, frequency, urgency, nocturia, hesitancy, discharge, hematuria or flank pain. Musculoskeletal: Denies arthralgias, myalgias, stiffness, jt. swelling, pain, limping or strain/sprain.  Skin: Denies pruritus, rash, hives, warts, acne, eczema or change in skin lesion(s). Neuro: No weakness, tremor, incoordination, spasms, paresthesia or pain. Psychiatric: Denies confusion, memory loss or sensory loss. Endo: Denies change in weight, skin or hair change.  Heme/Lymph: No excessive  bleeding, bruising or enlarged lymph nodes.  Physical Exam  BP 132/84   Pulse 70   Temp 97.9 F (36.6 C)   Resp 16   Ht 5\' 9"  (1.753 m)   Wt 229 lb 12.8 oz (104.2 kg)   SpO2 99%   BMI 33.94 kg/m   Appears  well nourished, well groomed  and in no distress.  Eyes: PERRLA, EOMs, conjunctiva no swelling or erythema. Sinuses: No frontal/maxillary tenderness ENT/Mouth: EAC's clear, TM's nl w/o erythema, bulging. Nares clear w/o erythema, swelling, exudates. Oropharynx clear without erythema or exudates. Oral hygiene is good. Tongue normal, non obstructing. Hearing intact.  Neck: Supple. Thyroid not palpable. Car 2+/2+ without bruits, nodes or JVD. Chest: Respirations nl with BS clear & equal w/o rales, rhonchi, wheezing or stridor.  Cor: Heart sounds normal w/ regular rate and rhythm without sig. murmurs, gallops, clicks or rubs. Peripheral pulses normal and equal  without edema.  Abdomen: Soft & bowel sounds normal. Non-tender w/o guarding, rebound, hernias, masses or organomegaly.  Lymphatics: Unremarkable.  Musculoskeletal: Full ROM all peripheral extremities, joint stability, 5/5 strength and normal gait.  Skin: Warm, dry without exposed rashes, lesions or ecchymosis apparent.  Neuro: Cranial nerves intact, reflexes equal bilaterally. Sensory-motor testing grossly intact. Tendon reflexes grossly intact.  Pysch: Alert & oriented x 3.  Insight and judgement nl & appropriate. No ideations.  Assessment and Plan:  1. Labile hypertension  - Continue medication, monitor blood pressure at home.  - Continue DASH diet.  Reminder to go to the ER if any CP,  SOB, nausea, dizziness, severe HA, changes vision/speech.  - CBC with Differential/Platelet - COMPLETE METABOLIC PANEL WITH GFR - Magnesium - TSH  2. Hyperlipidemia, mixed  - Continue diet/meds, exercise,& lifestyle modifications.  - Continue monitor periodic cholesterol/liver & renal functions   - Lipid panel - TSH  3.  Abnormal glucose  - Continue diet, exercise  - Lifestyle modifications.  - Monitor appropriate labs  - Fructosamine - Insulin, random  4. Vitamin D deficiency  - Continue supplementation.  - VITAMIN D 25 Hydroxy  5. Medication management  - CBC with Differential/Platelet - COMPLETE METABOLIC PANEL WITH GFR - Magnesium - Lipid panel - TSH - Fructosamine - Insulin, random - VITAMIN D 25 Hydroxy        Discussed  regular exercise, BP monitoring, weight control to achieve/maintain BMI less than 25 and discussed med and SE's. Recommended labs to assess and monitor clinical status with further disposition pending results of labs.  I discussed the assessment and treatment plan with the patient. The patient was provided an opportunity to ask questions and all were answered. The patient agreed with the plan and demonstrated an understanding of the instructions.  I provided over 30 minutes of exam, counseling, chart review and  complex critical decision making.        The patient was advised to call back or seek an in-person evaluation if the symptoms worsen or if the condition fails to improve as anticipated.   Kirtland Bouchard, MD

## 2020-03-16 NOTE — Patient Instructions (Signed)

## 2020-03-17 ENCOUNTER — Ambulatory Visit (INDEPENDENT_AMBULATORY_CARE_PROVIDER_SITE_OTHER): Payer: Medicare HMO | Admitting: Internal Medicine

## 2020-03-17 ENCOUNTER — Other Ambulatory Visit: Payer: Self-pay

## 2020-03-17 VITALS — BP 132/84 | HR 70 | Temp 97.9°F | Resp 16 | Ht 69.0 in | Wt 229.8 lb

## 2020-03-17 DIAGNOSIS — E782 Mixed hyperlipidemia: Secondary | ICD-10-CM | POA: Diagnosis not present

## 2020-03-17 DIAGNOSIS — R7309 Other abnormal glucose: Secondary | ICD-10-CM

## 2020-03-17 DIAGNOSIS — E559 Vitamin D deficiency, unspecified: Secondary | ICD-10-CM

## 2020-03-17 DIAGNOSIS — Z79899 Other long term (current) drug therapy: Secondary | ICD-10-CM | POA: Diagnosis not present

## 2020-03-17 DIAGNOSIS — R0989 Other specified symptoms and signs involving the circulatory and respiratory systems: Secondary | ICD-10-CM

## 2020-03-18 NOTE — Progress Notes (Signed)
============================================================ ============================================================  -    Total Chol = 150   and LDL Chol = 77 - Both  Excellent   - Very low risk for Heart Attack  / Stroke ============================================================ ============================================================  - Vitamin D = 54 - Great  ============================================================ ============================================================  - All Else - CBC - Kidneys - Electrolytes - Liver - Magnesium & Thyroid    - all  Normal / OK ============================================================ ============================================================

## 2020-03-22 LAB — CBC WITH DIFFERENTIAL/PLATELET
Absolute Monocytes: 466 cells/uL (ref 200–950)
Basophils Absolute: 59 cells/uL (ref 0–200)
Basophils Relative: 1 %
Eosinophils Absolute: 330 cells/uL (ref 15–500)
Eosinophils Relative: 5.6 %
HCT: 42.1 % (ref 35.0–45.0)
Hemoglobin: 14.1 g/dL (ref 11.7–15.5)
Lymphs Abs: 1800 cells/uL (ref 850–3900)
MCH: 31.5 pg (ref 27.0–33.0)
MCHC: 33.5 g/dL (ref 32.0–36.0)
MCV: 94 fL (ref 80.0–100.0)
MPV: 10.3 fL (ref 7.5–12.5)
Monocytes Relative: 7.9 %
Neutro Abs: 3245 cells/uL (ref 1500–7800)
Neutrophils Relative %: 55 %
Platelets: 271 10*3/uL (ref 140–400)
RBC: 4.48 10*6/uL (ref 3.80–5.10)
RDW: 11.8 % (ref 11.0–15.0)
Total Lymphocyte: 30.5 %
WBC: 5.9 10*3/uL (ref 3.8–10.8)

## 2020-03-22 LAB — COMPLETE METABOLIC PANEL WITH GFR
AG Ratio: 1.6 (calc) (ref 1.0–2.5)
ALT: 21 U/L (ref 6–29)
AST: 20 U/L (ref 10–35)
Albumin: 4.3 g/dL (ref 3.6–5.1)
Alkaline phosphatase (APISO): 79 U/L (ref 37–153)
BUN: 25 mg/dL (ref 7–25)
CO2: 24 mmol/L (ref 20–32)
Calcium: 9.2 mg/dL (ref 8.6–10.4)
Chloride: 105 mmol/L (ref 98–110)
Creat: 0.83 mg/dL (ref 0.50–0.99)
GFR, Est African American: 85 mL/min/{1.73_m2} (ref >=60)
GFR, Est Non African American: 73 mL/min/{1.73_m2} (ref >=60)
Globulin: 2.7 g/dL (calc) (ref 1.9–3.7)
Glucose, Bld: 99 mg/dL (ref 65–99)
Potassium: 4.5 mmol/L (ref 3.5–5.3)
Sodium: 139 mmol/L (ref 135–146)
Total Bilirubin: 0.5 mg/dL (ref 0.2–1.2)
Total Protein: 7 g/dL (ref 6.1–8.1)

## 2020-03-22 LAB — INSULIN, RANDOM: Insulin: 16.9 u[IU]/mL

## 2020-03-22 LAB — LIPID PANEL
Cholesterol: 150 mg/dL (ref <200)
HDL: 48 mg/dL — ABNORMAL LOW (ref >=50)
LDL Cholesterol (Calc): 77 mg/dL (calc)
Non-HDL Cholesterol (Calc): 102 mg/dL (calc) (ref <130)
Total CHOL/HDL Ratio: 3.1 (calc) (ref <5.0)
Triglycerides: 145 mg/dL (ref <150)

## 2020-03-22 LAB — VITAMIN D 25 HYDROXY (VIT D DEFICIENCY, FRACTURES): Vit D, 25-Hydroxy: 54 ng/mL (ref 30–100)

## 2020-03-22 LAB — FRUCTOSAMINE: Fructosamine: 239 umol/L (ref 205–285)

## 2020-03-22 LAB — TSH: TSH: 2.02 mIU/L (ref 0.40–4.50)

## 2020-03-22 LAB — MAGNESIUM: Magnesium: 2.2 mg/dL (ref 1.5–2.5)

## 2020-05-19 ENCOUNTER — Ambulatory Visit: Payer: Medicare HMO | Admitting: Adult Health

## 2020-07-20 NOTE — Progress Notes (Deleted)
AWV and follow up  Assessment and Plan:  Annual Medicare Wellness Visit Due annually  Health maintenance reviewed  Labile hypertension - continue medications, DASH diet, exercise and monitor at home. Call if greater than 130/80.   Hyperlipidemia -continue medications, titrate rosuvastatin for LDL goal <100 check lipids, decrease fatty foods, increase activity.   Other abnormal glucose Discussed general issues about diabetes pathophysiology and management., Educational material distributed., Suggested low cholesterol diet., Encouraged aerobic exercise., Discussed foot care., Reminded to get yearly retinal exam.   Obesity, BMI 32 *** Obesity with co morbidities- long discussion about weight loss, diet, and exercise Weight loss goal of <200 lb set  Hepatic steatosis Weight loss advised, avoid alcohol/tylenol, will monitor LFTs  Allergy, subsequent encounter Continue OTC meds  Medication management Due at each visit   GERD Well managed on current medications Discussed diet, avoiding triggers and other lifestyle changes   Vitamin D deficiency Continue to recommend supplementation for goal of 60-100   Incontinence/OAB She reports managing fairly with lifestyle; vesicare worked but was expensive.  Myrbetriq not helpful. Kegal's discussed ***  No orders of the defined types were placed in this encounter.    Discussed med's effects and SE's. Screening labs and tests as requested with regular follow-up as recommended. Future Appointments  Date Time Provider [place]  07/21/2020  9:30 AM [name], NP [facility] None  11/11/2020  9:00 AM [name], NP [facility] None    Plan:   During the course of the visit the patient was educated and counseled about appropriate screening and preventive services including:   Pneumococcal vaccine  Prevnar 13 Influenza vaccine Td vaccine Screening electrocardiogram Bone densitometry screening Colorectal cancer  screening Diabetes screening Glaucoma screening Nutrition counseling  Advanced directives: requested   HPI 65 y.o. female  presents for AWV and follow up. She has Hyperlipidemia; Environmental allergies; Other abnormal glucose; Obesity (BMI 30.0-34.9); Labile hypertension; OAB (overactive bladder); Vitamin D deficiency; Acid reflux; and Hepatic steatosis on their problem list.  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 Has 3 kids, 5 grandsons and 3 grand daughters. Wants to travel has camper and wants to drive the [place] and [place].   She does take famotidine 10 mg daily PM with improved AM cough.   She was on myrbetriq for OAB/incontinence but didn't feel this helped. Previously on vesicare which worked better but cost prohibitive. Feels she is doing fairly without agent at this time. She wears a panty liner, minimal leakage.   BMI is There is no height or weight on file to calculate BMI., she has been working on diet and exercise, she is golfing 3 days a week since not going to the gym. Plans to start walking as well.  She has been cutting sugar, avoiding starches. She reports 65+ fluid ounces of water daily. She drinks occasional ice tea with splenda. She eats 5+ servings of fruits vegetables.  Wt Readings from Last 3 Encounters:  03/17/20 229 lb 12.8 oz (104.2 kg)  11/11/19 222 lb 3.2 oz (100.8 kg)  05/12/19 222 lb (100.7 kg)    Her blood pressure has been controlled at home, today their BP is   She does workout, does golfing 3 days a week. She denies chest pain, shortness of breath, dizziness.   She is on cholesterol medication rosuvastatin 10 mg daily and denies myalgias. Her cholesterol is not at goal. The cholesterol last visit was:   Lab Results  Component Value  Date   CHOL 150 03/17/2020   HDL 48 (L) 03/17/2020   LDLCALC 77 03/17/2020   TRIG 145 03/17/2020   CHOLHDL 3.1 03/17/2020    She has been working on diet and exercise for  glucose management, and denies paresthesia of the feet, polydipsia and polyuria. Last A1C in the office was:  Lab Results  Component Value Date   HGBA1C 5.7 (H) 11/11/2019   Lab Results  Component Value Date   GFRNONAA 73 03/17/2020   Patient is on Vitamin D supplement, currently taking 2000 IU daily   Lab Results  Component Value Date   VD25OH 54 03/17/2020      Current Medications:  Current Outpatient Medications on File Prior to Visit  Medication Sig Dispense Refill   BABY ASPIRIN PO Take 81 mg by mouth daily.     Calcium-Vitamin D (CALTRATE 600 PLUS-VIT D PO) Take by mouth daily.     CHOLECALCIFEROL PO Take 2,000 Units by mouth daily.     desloratadine (CLARINEX) 5 MG tablet TAKE 1 TABLET DAILY FOR    ALLERGIES 90 tablet 3   famotidine (PEPCID) 10 MG tablet Take 10 mg by mouth daily.     Multiple Vitamins-Minerals (MULTIVITAMIN PO) Take by mouth daily.     Omega-3 Fatty Acids (FISH OIL PO) Take by mouth daily.     rosuvastatin (CRESTOR) 10 MG tablet Take     1 tablet      Daily      for Cholesterol 90 tablet 3   Zinc 25 MG TABS Take 1 tablet by mouth daily.     No current facility-administered medications on file prior to visit.   Health Maintenance:   Immunization History  Administered Date(s) Administered   Influenza Inj Mdck Quad With Preservative 10/24/2016, 11/07/2017   Influenza Split 10/20/2014   Influenza, High Dose Seasonal PF 11/11/2018, 11/11/2019   Influenza, Seasonal, Injecte, Preservative Fre 10/20/2015   Tdap 07/13/2009   Tetanus: 2011 - DUE, will check with insurance,  Pneumovax: 1997, due in 1 year from [place] [facility]: declines today, will think about  Flu vaccine: 11/2018, TODAY  Shingrix: wants to check with insurance  covid 19: declines at this time   Pap: 2011 declines had hysterectomy, no further per GYN MGM: 01/2019 DEXA: 01/2019, T -0.5, normal  Colonoscopy: 2006  Cologuard: 02/2017 neg due 02/2020 - will send in to hold  EGD:  N/A  MRI brain 2002 MRI cervical spine 2002  Names of Other Physician/Practitioners you currently use: 1. [facility] here for primary care 2. [name], eye doctor, last visit 2021 3. [name], dentist, last visit 2020, will be changing due to new insurance, has scheduled with new next week  4. [name], derm, last visit 2021, goes annually   Patient Care Team: [name], MD as PCP - General (Internal Medicine) [name], MD as Consulting Physician (Orthopedic Surgery) [name], MD as Consulting Physician (Dermatology) [name], [name], MD as Consulting Physician (Gastroenterology)  Medical History:  Past Medical History:  Diagnosis Date   Allergy    Cervical disc disorder    s/p fusion    Hyperlipidemia    Allergies No Known Allergies  SURGICAL HISTORY She  has a past surgical history that includes Tubal ligation (Bilateral, 1986); Abdominal hysterectomy (1990); and Cervical disc surgery (2000). FAMILY HISTORY Her family history includes Arthritis in her father and mother; Dementia in her mother; Diabetes in her father; Heart attack in  her paternal grandfather; Hyperlipidemia in her father and mother; Hypertension in her father and mother. SOCIAL HISTORY She  reports that she has never smoked. She has never used smokeless tobacco. She reports that she does not drink alcohol and does not use drugs.   MEDICARE WELLNESS OBJECTIVES: Physical activity:   Cardiac risk factors:   Depression/mood screen:   Depression screen [facility] 2/9 03/16/2020  Decreased Interest 0  Down, Depressed, Hopeless 0  PHQ - 2 Score 0    ADLs:  No flowsheet data found.   Cognitive Testing  Alert? Yes  Normal Appearance?Yes  Oriented to person? Yes  Place? Yes   Time? Yes  Recall of three objects?  Yes  Can perform simple calculations? Yes  Displays appropriate judgment?Yes  Can read the correct time from a watch  face?Yes  EOL planning:        Review of Systems  Constitutional: Negative.  Negative for malaise/fatigue and weight loss.  HENT: Negative.  Negative for hearing loss and tinnitus.   Eyes: Negative.  Negative for blurred vision and double vision.  Respiratory: Negative.  Negative for cough, sputum production, shortness of breath and wheezing.   Cardiovascular: Negative.  Negative for chest pain, palpitations, orthopnea, claudication, leg swelling and PND.  Gastrointestinal: Negative.  Negative for abdominal pain, blood in stool, constipation, diarrhea, heartburn, melena, nausea and vomiting.  Genitourinary: Negative.   Musculoskeletal: Negative.  Negative for falls, joint pain and myalgias.  Skin: Negative.  Negative for rash.  Neurological: Negative.  Negative for dizziness, tingling, sensory change, weakness and headaches.  Endo/Heme/Allergies: Negative.  Negative for polydipsia.  Psychiatric/Behavioral: Negative.  Negative for depression, memory loss, substance abuse and suicidal ideas. The patient is not nervous/anxious and does not have insomnia.   All other systems reviewed and are negative.   Physical Exam: Estimated body mass index is 33.94 kg/m as calculated from the following:   Height as of 03/17/20: 5\' 9"  (1.753 m).   Weight as of 03/17/20: 229 lb 12.8 oz (104.2 kg). There were no vitals taken for this visit. General Appearance: Well nourished, in no apparent distress. Eyes: PERRLA, EOMs, conjunctiva no swelling or erythema Sinuses: No Frontal/maxillary tenderness ENT/Mouth: Ext aud canals clear, normal light reflex with TMs without erythema, bulging.  Good dentition. No erythema, swelling, or exudate on post pharynx. Tonsils not swollen or erythematous. Hearing normal.  Neck: Supple, thyroid normal. No bruits Respiratory: Respiratory effort normal, BS equal bilaterally without rales, rhonchi, wheezing or stridor. Cardio: RRR without murmurs, rubs or gallops. Brisk  peripheral pulses without edema.  Abdomen: Soft, +BS. Non tender, no guarding, rebound, hernias, masses, or organomegaly. .  Lymphatics: Non tender without lymphadenopathy.  Musculoskeletal: Full ROM all peripheral extremities,5/5 strength, and normal gait. Skin: Warm, dry without rashes, lesions, ecchymosis.  Neuro: Cranial nerves intact, reflexes equal bilaterally. Normal muscle tone, no cerebellar symptoms. Sensation intact.  Psych: Awake and oriented X 3, normal affect, Insight and Judgment appropriate.    Medicare Attestation I have personally reviewed: The patient's medical and social history Their use of alcohol, tobacco or illicit drugs Their current medications and supplements The patient's functional ability including ADLs,fall risks, home safety risks, cognitive, and hearing and visual impairment Diet and physical activities Evidence for depression or mood disorders  The patient's weight, height, BMI, and visual acuity have been recorded in the chart.  I have made referrals, counseling, and provided education to the patient based on review of the above and I have provided the patient with  a written personalized care plan for preventive services.     Izora Ribas, NP 1:38 PM [facility]

## 2020-07-21 ENCOUNTER — Ambulatory Visit: Payer: Medicare HMO | Admitting: Adult Health

## 2020-07-21 DIAGNOSIS — K219 Gastro-esophageal reflux disease without esophagitis: Secondary | ICD-10-CM

## 2020-07-21 DIAGNOSIS — E559 Vitamin D deficiency, unspecified: Secondary | ICD-10-CM

## 2020-07-21 DIAGNOSIS — Z9109 Other allergy status, other than to drugs and biological substances: Secondary | ICD-10-CM

## 2020-07-21 DIAGNOSIS — N3281 Overactive bladder: Secondary | ICD-10-CM

## 2020-07-21 DIAGNOSIS — K760 Fatty (change of) liver, not elsewhere classified: Secondary | ICD-10-CM

## 2020-07-21 DIAGNOSIS — R0989 Other specified symptoms and signs involving the circulatory and respiratory systems: Secondary | ICD-10-CM

## 2020-07-21 DIAGNOSIS — Z0000 Encounter for general adult medical examination without abnormal findings: Secondary | ICD-10-CM

## 2020-07-21 DIAGNOSIS — E785 Hyperlipidemia, unspecified: Secondary | ICD-10-CM

## 2020-07-21 DIAGNOSIS — R7309 Other abnormal glucose: Secondary | ICD-10-CM

## 2020-07-21 DIAGNOSIS — E669 Obesity, unspecified: Secondary | ICD-10-CM

## 2020-07-29 NOTE — Progress Notes (Signed)
MEDICARE ANNUAL WELLNESS VISIT AND FOLLOW UP  Assessment:   Kristina Hunter was seen today for medicare wellness and follow-up.  Diagnoses and all orders for this visit:  Encounter for annual wellness exam in Medicare patient       - Due annually       - Pneumococcal vaccine  Labile hypertension -     CBC with Differential/Platelet -  continue medications, DASH diet, exercise and monitor at home. Call if greater than 130/80.    Hyperlipidemia, mixed -     COMPLETE METABOLIC PANEL WITH GFR -     Lipid panel -     TSH - Continue medications - Continue diet and exercise.    Abnormal glucose -     COMPLETE METABOLIC PANEL WITH GFR -     Hemoglobin A1c  Vitamin D deficiency       - Continue Vit D supplementation  Medication management -     CBC with Differential/Platelet -     COMPLETE METABOLIC PANEL WITH GFR -     Lipid panel -     TSH -     Hemoglobin A1c  Gastroesophageal reflux disease, unspecified whether esophagitis present       - Continue Pepcid and dietary modifications  Obesity (BMI 30.0-34.9)       - Counseled on diet with lots of fruits, vegetables and fiber. Continue exercise  Screening for thyroid disorder -     TSH   Cut on scalp        - Pt has a cut on her scalp from uncertain cause and has multiple grandchildren she is around. Will vaccinate for Tdap for prevention today  Over 40 minutes of exam, counseling, chart review and critical decision making was performed Future Appointments  Date Time Provider [place]  11/11/2020  9:00 AM Kristina Comber, NP [facility] None  08/02/2021 10:00 AM Kristina Bernheim, NP [facility] None     Plan:   During the course of the visit the patient was educated and counseled about appropriate screening and preventive services including:   Pneumococcal vaccine  Prevnar 13 Influenza vaccine Td vaccine Screening electrocardiogram Bone densitometry screening Colorectal cancer screening Diabetes screening Glaucoma  screening Nutrition counseling  Advanced directives: requested   Subjective:  Kristina Hunter is a 65 y.o. female who presents for Medicare Annual Wellness Visit and 3 month follow up.   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 Has 3 kids, 5 grandsons and 3 grand daughters. Wants to travel has camper and wants to drive the [place] and [place]. Mother passed away 04/08/2015 at 38.   She does take famotidine daily 10 mg for reflux symptoms, has improved persistent cough.   She was on myrbetriq for OAB/incontinence but didn't feel this helped. Previously on vesicare which worked better but cost prohibitive. Feels she is doing fairly without agent at this time. She wears a panty liner, minimal leakage.   BMI is Body mass index is 33.33 kg/m., she has been working on diet and exercise, she is golfing 4 hours 3 days a week since not going to the gym. Plans to start walking as well.  She has been cutting sugar, avoiding starches. She reports 65+ fluid ounces of water daily. She drinks occasional ice tea with splenda. She eats 5+ servings of fruits vegetables.  Wt Readings from Last 3 Encounters:  08/02/20 229 lb (103.9 kg)  03/17/20 229 lb 12.8 oz (104.2 kg)  11/11/19  222 lb 3.2 oz (100.8 kg)   Her blood pressure has been controlled at home, today their BP is BP: 122/88 She does workout. She denies chest pain, shortness of breath, dizziness.   She is on cholesterol medication (rosuvastatin 10 mg daily) and denies myalgias. Her cholesterol is at goal. The cholesterol last visit was:   Lab Results  Component Value Date   CHOL 150 03/17/2020   HDL 48 (L) 03/17/2020   LDLCALC 77 03/17/2020   TRIG 145 03/17/2020   CHOLHDL 3.1 03/17/2020   She has had prediabetes predating 2015. She has been working on diet and exercise for prediabetes, and denies increased appetite, nausea, paresthesia of the feet, polydipsia, polyuria, visual disturbances, vomiting and weight loss. Last  A1C in the office was:  Lab Results  Component Value Date   HGBA1C 5.7 (H) 11/11/2019   Reports 64+ fluid ounces minimum daily. Last GFR: Lab Results  Component Value Date   GFRNONAA 73 03/17/2020   Patient is on Vitamin D supplement.   Lab Results  Component Value Date   VD25OH 54 03/17/2020      Medication Review: Current Outpatient Medications on File Prior to Visit  Medication Sig Dispense Refill   BABY ASPIRIN PO Take 81 mg by mouth daily.     Calcium-Vitamin D (CALTRATE 600 PLUS-VIT D PO) Take by mouth daily.     CHOLECALCIFEROL PO Take 2,000 Units by mouth daily.     Cyanocobalamin (B-12) 1000 MCG TABS Take 1,000 mcg by mouth.     desloratadine (CLARINEX) 5 MG tablet TAKE 1 TABLET DAILY FOR    ALLERGIES 90 tablet 3   famotidine (PEPCID) 10 MG tablet Take 10 mg by mouth daily.     Multiple Vitamins-Minerals (MULTIVITAMIN PO) Take by mouth daily.     Omega-3 Fatty Acids (FISH OIL PO) Take by mouth daily.     rosuvastatin (CRESTOR) 10 MG tablet Take     1 tablet      Daily      for Cholesterol 90 tablet 3   Zinc 25 MG TABS Take 1 tablet by mouth daily.     No current facility-administered medications on file prior to visit.    No Known Allergies  Current Problems (verified) Patient Active Problem List   Diagnosis Date Noted   Hepatic steatosis 05/13/2019   Acid reflux 05/06/2019   Vitamin D deficiency 05/14/2018   OAB (overactive bladder) 11/06/2017   Other abnormal glucose 10/20/2014   Obesity (BMI 30.0-34.9) 10/20/2014   Labile hypertension 10/20/2014   Hyperlipidemia    Environmental allergies     Screening Tests Immunization History  Administered Date(s) Administered   Influenza Inj Mdck Quad With Preservative 10/24/2016, 11/07/2017   Influenza Split 10/20/2014   Influenza, High Dose Seasonal PF 11/11/2018, 11/11/2019   Influenza, Seasonal, Injecte, Preservative Fre 10/20/2015   Tdap 07/13/2009   Tetanus: 2022 Pneumovax: 1997, due in 1 year from  [place] 13: 2022  Flu vaccine: 11/2019 Zostavax: declines  covid 19: declines at this time  Shingrix: Want to get this year.   Pap: 2011 declines had hysterectomy, no further per GYN MGM: 03/11/20 Negative DEXA: 01/2019, T -0.5, normal  Colonoscopy: 2006  Cologuard: 03/04/20 negative EGD: N/A    Names of Other Physician/Practitioners you currently use: 1. [facility] here for primary care 2. [name], eye doctor, last visit 08/11/19  3. [name], dentist, last visit 11/2018, 4. [name], derm, last visit 2021,  goes annually   Patient Care Team: [name], MD as PCP - General (Internal Medicine) [name], MD as Consulting Physician (Orthopedic Surgery) [name], MD as Consulting Physician (Dermatology) [name], [name], MD as Consulting Physician (Gastroenterology)  SURGICAL HISTORY She  has a past surgical history that includes Tubal ligation (Bilateral, 1986); Abdominal hysterectomy (1990); and Cervical disc surgery (2000). FAMILY HISTORY Her family history includes Arthritis in her father and mother; Dementia in her mother; Diabetes in her father; Heart attack in her paternal grandfather; Hyperlipidemia in her father and mother; Hypertension in her father and mother. SOCIAL HISTORY She  reports that she has never smoked. She has never used smokeless tobacco. She reports that she does not drink alcohol and does not use drugs.   MEDICARE WELLNESS OBJECTIVES: Physical activity: Exercise limited by: None identified Cardiac risk factors: Cardiac Risk Factors include: dyslipidemia;hypertension;obesity (BMI >30kg/m2) Depression/mood screen:   Depression screen [facility] 2/9 08/02/2020  Decreased Interest 0  Down, Depressed, Hopeless 0  PHQ - 2 Score 0    ADLs:  In your present state of health, do you have any difficulty performing the following activities: 08/02/2020  Hearing? N  Vision? N  Difficulty  concentrating or making decisions? N  Walking or climbing stairs? N  Dressing or bathing? N  Doing errands, shopping? N  Some recent data might be hidden      Cognitive Testing  Alert? Yes  Normal Appearance?Yes  Oriented to person? Yes  Place? Yes   Time? Yes  Recall of three objects?  Yes  Can perform simple calculations? Yes  Displays appropriate judgment?Yes  Can read the correct time from a watch face?Yes  EOL planning: Does Patient Have a Medical Advance Directive?: No Would patient like information on creating a medical advance directive?: Yes (MAU/Ambulatory/Procedural Areas - Information given)  Review of Systems  Constitutional:  Negative for chills, fever, malaise/fatigue and weight loss.  HENT:  Negative for congestion, hearing loss and tinnitus.   Eyes:  Negative for blurred vision and double vision.  Respiratory:  Negative for cough, sputum production, shortness of breath and wheezing.   Cardiovascular:  Negative for chest pain, palpitations, orthopnea, claudication, leg swelling and PND.  Gastrointestinal:  Positive for heartburn. Negative for abdominal pain, blood in stool, constipation, diarrhea, melena, nausea and vomiting.  Genitourinary: Negative.   Musculoskeletal:  Positive for back pain (stands long periods pain in lower back). Negative for falls, joint pain and myalgias.  Skin:  Negative for rash.       Cut on scalp uncertain origin  Neurological:  Positive for tingling (occ right hand, has been since cervical fusion). Negative for dizziness, tremors, sensory change, loss of consciousness, weakness and headaches.  Endo/Heme/Allergies:  Positive for environmental allergies. Negative for polydipsia.  Psychiatric/Behavioral: Negative.  Negative for depression, memory loss, substance abuse and suicidal ideas. The patient is not nervous/anxious and does not have insomnia.   All other systems reviewed and are negative.   Objective:     Today's Vitals    08/02/20 0955  BP: 122/88  Pulse: 79  Temp: (!) 97.3 F (36.3 C)  SpO2: 97%  Weight: 229 lb (103.9 kg)  Height: 5' 9.5" (1.765 m)   Body mass index is 33.33 kg/m.  General appearance: alert, no distress, WD/WN, female HEENT: normocephalic, sclerae anicteric, TMs pearly, nares patent, no discharge or erythema, pharynx normal Oral cavity: MMM, no lesions Neck: supple, no lymphadenopathy, no thyromegaly, no masses Heart: RRR, normal S1, S2, no murmurs Lungs:  CTA bilaterally, no wheezes, rhonchi, or rales Skin:Small 1-2 cm cut on scalp , no signs of infection Abdomen: +bs, soft, non tender, non distended, no masses, no hepatomegaly, no splenomegaly Musculoskeletal: nontender, no swelling, no obvious deformity Extremities: no edema, no cyanosis, no clubbing Pulses: 2+ symmetric, upper and lower extremities, normal cap refill Neurological: alert, oriented x 3, CN2-12 intact, strength normal upper extremities and lower extremities, sensation normal throughout, DTRs 2+ throughout, no cerebellar signs, gait normal Psychiatric: normal affect, behavior normal, pleasant   Medicare Attestation I have personally reviewed: The patient's medical and social history Their use of alcohol, tobacco or illicit drugs Their current medications and supplements The patient's functional ability including ADLs,fall risks, home safety risks, cognitive, and hearing and visual impairment Diet and physical activities Evidence for depression or mood disorders  The patient's weight, height, BMI, and visual acuity have been recorded in the chart.  I have made referrals, counseling, and provided education to the patient based on review of the above and I have provided the patient with a written personalized care plan for preventive services.     Kristina Hunter ANP-C  [facility]  08/02/2020

## 2020-08-02 ENCOUNTER — Ambulatory Visit (INDEPENDENT_AMBULATORY_CARE_PROVIDER_SITE_OTHER): Payer: Medicare HMO | Admitting: Nurse Practitioner

## 2020-08-02 ENCOUNTER — Other Ambulatory Visit: Payer: Self-pay

## 2020-08-02 ENCOUNTER — Encounter: Payer: Self-pay | Admitting: Nurse Practitioner

## 2020-08-02 VITALS — BP 122/88 | HR 79 | Temp 97.3°F | Ht 69.5 in | Wt 229.0 lb

## 2020-08-02 DIAGNOSIS — Z79899 Other long term (current) drug therapy: Secondary | ICD-10-CM | POA: Diagnosis not present

## 2020-08-02 DIAGNOSIS — R7309 Other abnormal glucose: Secondary | ICD-10-CM | POA: Diagnosis not present

## 2020-08-02 DIAGNOSIS — R6889 Other general symptoms and signs: Secondary | ICD-10-CM

## 2020-08-02 DIAGNOSIS — R0989 Other specified symptoms and signs involving the circulatory and respiratory systems: Secondary | ICD-10-CM | POA: Diagnosis not present

## 2020-08-02 DIAGNOSIS — S0101XA Laceration without foreign body of scalp, initial encounter: Secondary | ICD-10-CM

## 2020-08-02 DIAGNOSIS — Z0001 Encounter for general adult medical examination with abnormal findings: Secondary | ICD-10-CM

## 2020-08-02 DIAGNOSIS — Z1329 Encounter for screening for other suspected endocrine disorder: Secondary | ICD-10-CM | POA: Diagnosis not present

## 2020-08-02 DIAGNOSIS — E782 Mixed hyperlipidemia: Secondary | ICD-10-CM

## 2020-08-02 DIAGNOSIS — E559 Vitamin D deficiency, unspecified: Secondary | ICD-10-CM

## 2020-08-02 DIAGNOSIS — Z0000 Encounter for general adult medical examination without abnormal findings: Secondary | ICD-10-CM

## 2020-08-02 DIAGNOSIS — E669 Obesity, unspecified: Secondary | ICD-10-CM | POA: Diagnosis not present

## 2020-08-02 DIAGNOSIS — Z23 Encounter for immunization: Secondary | ICD-10-CM | POA: Diagnosis not present

## 2020-08-02 DIAGNOSIS — K219 Gastro-esophageal reflux disease without esophagitis: Secondary | ICD-10-CM | POA: Diagnosis not present

## 2020-08-02 NOTE — Patient Instructions (Signed)

## 2020-08-03 LAB — CBC WITH DIFFERENTIAL/PLATELET
Absolute Monocytes: 398 cells/uL (ref 200–950)
Basophils Absolute: 42 cells/uL (ref 0–200)
Basophils Relative: 0.8 %
Eosinophils Absolute: 101 cells/uL (ref 15–500)
Eosinophils Relative: 1.9 %
HCT: 44.4 % (ref 35.0–45.0)
Hemoglobin: 14.9 g/dL (ref 11.7–15.5)
Lymphs Abs: 1399 cells/uL (ref 850–3900)
MCH: 31.3 pg (ref 27.0–33.0)
MCHC: 33.6 g/dL (ref 32.0–36.0)
MCV: 93.3 fL (ref 80.0–100.0)
MPV: 10.7 fL (ref 7.5–12.5)
Monocytes Relative: 7.5 %
Neutro Abs: 3360 cells/uL (ref 1500–7800)
Neutrophils Relative %: 63.4 %
Platelets: 289 10*3/uL (ref 140–400)
RBC: 4.76 10*6/uL (ref 3.80–5.10)
RDW: 11.7 % (ref 11.0–15.0)
Total Lymphocyte: 26.4 %
WBC: 5.3 10*3/uL (ref 3.8–10.8)

## 2020-08-03 LAB — HEMOGLOBIN A1C
Hgb A1c MFr Bld: 5.7 % of total Hgb — ABNORMAL HIGH (ref <5.7)
Mean Plasma Glucose: 117 mg/dL
eAG (mmol/L): 6.5 mmol/L

## 2020-08-03 LAB — COMPLETE METABOLIC PANEL WITH GFR
AG Ratio: 1.7 (calc) (ref 1.0–2.5)
ALT: 24 U/L (ref 6–29)
AST: 22 U/L (ref 10–35)
Albumin: 4.3 g/dL (ref 3.6–5.1)
Alkaline phosphatase (APISO): 76 U/L (ref 37–153)
BUN: 21 mg/dL (ref 7–25)
CO2: 26 mmol/L (ref 20–32)
Calcium: 9.2 mg/dL (ref 8.6–10.4)
Chloride: 106 mmol/L (ref 98–110)
Creat: 0.91 mg/dL (ref 0.50–1.05)
Globulin: 2.6 g/dL (calc) (ref 1.9–3.7)
Glucose, Bld: 93 mg/dL (ref 65–99)
Potassium: 5.1 mmol/L (ref 3.5–5.3)
Sodium: 140 mmol/L (ref 135–146)
Total Bilirubin: 0.5 mg/dL (ref 0.2–1.2)
Total Protein: 6.9 g/dL (ref 6.1–8.1)
eGFR: 70 mL/min/{1.73_m2} (ref >=60)

## 2020-08-03 LAB — LIPID PANEL
Cholesterol: 153 mg/dL (ref <200)
HDL: 47 mg/dL — ABNORMAL LOW (ref >=50)
LDL Cholesterol (Calc): 80 mg/dL (calc)
Non-HDL Cholesterol (Calc): 106 mg/dL (calc) (ref <130)
Total CHOL/HDL Ratio: 3.3 (calc) (ref <5.0)
Triglycerides: 161 mg/dL — ABNORMAL HIGH (ref <150)

## 2020-08-03 LAB — TSH: TSH: 1.64 mIU/L (ref 0.40–4.50)

## 2020-08-12 ENCOUNTER — Other Ambulatory Visit: Payer: Self-pay | Admitting: Internal Medicine

## 2020-08-12 DIAGNOSIS — E782 Mixed hyperlipidemia: Secondary | ICD-10-CM

## 2020-08-12 DIAGNOSIS — H2513 Age-related nuclear cataract, bilateral: Secondary | ICD-10-CM | POA: Diagnosis not present

## 2020-08-12 DIAGNOSIS — Z0100 Encounter for examination of eyes and vision without abnormal findings: Secondary | ICD-10-CM | POA: Diagnosis not present

## 2020-08-12 DIAGNOSIS — H5203 Hypermetropia, bilateral: Secondary | ICD-10-CM | POA: Diagnosis not present

## 2020-11-10 NOTE — Progress Notes (Signed)
Complete Physical  Assessment and Plan:  Encounter for Annual Physical Exam with abnormal findings Due annually  Health Maintenance reviewed Healthy lifestyle reviewed and goals set  Labile hypertension - continue medications, DASH diet, exercise and monitor at home. Call if greater than 130/80.  - CBC with Differential/Platelet - CMP/GGFR - TSH - Urinalysis, Routine w reflex microscopic (not at [facility]) - Microalbumin / creatinine urine ratio - EKG 12-Lead  Hyperlipidemia -continue medications, titrate rosuvastatin for LDL goal <100 check lipids, decrease fatty foods, increase activity.  - Lipid panel  Other abnormal glucose Discussed general issues about diabetes pathophysiology and management., Educational material distributed., Suggested low cholesterol diet., Encouraged aerobic exercise., Discussed foot care., Reminded to get yearly retinal exam. - Hemoglobin A1c   Obesity, BMI 33 Obesity with co morbidities- long discussion about weight loss, diet, and exercise Declines meds, making mostly good choices Discussed plan to add resistance exercises Weight loss goal of <215 lb set  Hepatic steatosis Weight loss advised, avoid alcohol/tylenol, will monitor LFTs  Allergy, subsequent encounter Continue OTC meds  Medication management - Magnesium   Vitamin D deficiency - Vit D  25 hydroxy (rtn osteoporosis monitoring)  Need for influenza vaccine High dose quadrivalent administered without complication today   No orders of the defined types were placed in this encounter.     Discussed med's effects and SE's. Screening labs and tests as requested with regular follow-up as recommended. Future Appointments  Date Time Provider [place]  08/02/2021 10:00 AM Magda Bernheim, NP [facility] None  11/11/2021  9:00 AM [name], NP [facility] None    HPI 65 y.o. female  presents for a complete physical. She has Hyperlipidemia; Environmental allergies; Other  abnormal glucose; Obesity (BMI 30.0-34.9); Labile hypertension; OAB (overactive bladder); Vitamin D deficiency; Acid reflux; and Hepatic steatosis on their problem list.  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 Has 3 kids, 5 grandsons and 3 grand daughters. Wants to travel has camper and wants to drive the [place] and [place].   She does take famotidine 10 mg daily PM with improved AM cough.   BMI is Body mass index is 33.52 kg/m., she has been working on diet and exercise, she is golfing 3 days a week since not going to the gym. Trying to add some.  She has been cutting sugar, avoiding starches.  She reports 65+ fluid ounces of water daily. She drinks occasional ice tea with splenda.  She eats 5+ servings of fruits vegetables.  Limits flour product, uses [employer] killer bread.  Wt Readings from Last 3 Encounters:  11/11/20 227 lb (103 kg)  08/02/20 229 lb (103.9 kg)  03/17/20 229 lb 12.8 oz (104.2 kg)    Her blood pressure has been controlled at home, today their BP is BP: 110/70 She does workout, does golfing 3 days a week. She denies chest pain, shortness of breath, dizziness.   She is on cholesterol medication rosuvastatin 10 mg daily and denies myalgias. Her cholesterol is not at goal. The cholesterol last visit was:   Lab Results  Component Value Date   CHOL 153 08/02/2020   HDL 47 (L) 08/02/2020   LDLCALC 80 08/02/2020   TRIG 161 (H) 08/02/2020   CHOLHDL 3.3 08/02/2020    She has been working on diet and exercise for glucose management, and denies paresthesia of the feet, polydipsia and polyuria. Last A1C in the office was:  Lab Results  Component Value Date  HGBA1C 5.7 (H) 08/02/2020   Lab Results  Component Value Date   GFRNONAA 73 03/17/2020   Patient is on Vitamin D supplement, currently taking 2000 IU daily   Lab Results  Component Value Date   VD25OH 68 03/17/2020      Current Medications:  Current Outpatient  Medications on File Prior to Visit  Medication Sig Dispense Refill   BABY ASPIRIN PO Take 81 mg by mouth daily.     Calcium-Vitamin D (CALTRATE 600 PLUS-VIT D PO) Take by mouth daily.     CHOLECALCIFEROL PO Take 2,000 Units by mouth daily.     Cyanocobalamin (B-12) 1000 MCG TABS Take 1,000 mcg by mouth.     desloratadine (CLARINEX) 5 MG tablet TAKE 1 TABLET DAILY FOR    ALLERGIES 90 tablet 3   famotidine (PEPCID) 10 MG tablet Take 10 mg by mouth daily.     Multiple Vitamins-Minerals (MULTIVITAMIN PO) Take by mouth daily.     Omega-3 Fatty Acids (FISH OIL PO) Take by mouth daily.     rosuvastatin (CRESTOR) 10 MG tablet TAKE 1 TABLET DAILY FOR CHOLESTEROL 90 tablet 3   vitamin C (ASCORBIC ACID) 500 MG tablet Take 500 mg by mouth daily.     Zinc 25 MG TABS Take 1 tablet by mouth daily.     No current facility-administered medications on file prior to visit.   Health Maintenance:   Immunization History  Administered Date(s) Administered   Influenza Inj Mdck Quad With Preservative 10/24/2016, 11/07/2017   Influenza Split 10/20/2014   Influenza, High Dose Seasonal PF 11/11/2018, 11/11/2019   Influenza, Seasonal, Injecte, Preservative Fre 10/20/2015   Pneumococcal Polysaccharide-23 08/02/2020   Tdap 07/13/2009, 08/02/2020   Tetanus: 08/2020 Pneumovax: 1997, 2022 Prevnar 13: declines today, will think about  Flu vaccine: 11/2019, TODAY  Shingrix: had first, scheduled for the second, getting at pharmacy  covid 19: declines at this time   Pap: 2011 declines had hysterectomy, no further per GYN MGM: 03/2020 DEXA: 01/2019, T -0.5, normal   Colonoscopy: 2006  Cologuard: 03/2020 EGD: N/A  MRI brain 2002 MRI cervical spine 2002  Names of Other Physician/Practitioners you currently use: 1. [facility] here for primary care 2. [name], eye doctor, last visit 2022 3. [facility], dentist, last visit 2022 4. [name], derm, last visit  01/2020, goes annually   Patient Care Team: [name], MD as PCP - General (Internal Medicine) [name], MD as Consulting Physician (Orthopedic Surgery) [name], MD as Consulting Physician (Dermatology) [name], [name], MD as Consulting Physician (Gastroenterology)  Medical History:  Past Medical History:  Diagnosis Date   Allergy    Cervical disc disorder    s/p fusion    Hyperlipidemia    Allergies No Known Allergies  SURGICAL HISTORY She  has a past surgical history that includes Tubal ligation (Bilateral, 1986); Abdominal hysterectomy (1990); and Cervical disc surgery (2000). FAMILY HISTORY Her family history includes Arthritis in her father and mother; Dementia in her mother; Diabetes in her father; Heart attack in her paternal grandfather; Hyperlipidemia in her father and mother; Hypertension in her father and mother. SOCIAL HISTORY She  reports that she has never smoked. She has never used smokeless tobacco. She reports that she does not drink alcohol and does not use drugs.   Review of Systems  Constitutional: Negative.  Negative for malaise/fatigue and weight loss.  HENT: Negative.  Negative for hearing loss and tinnitus.   Eyes:  Negative.  Negative for blurred vision and double vision.  Respiratory: Negative.  Negative for cough, sputum production, shortness of breath and wheezing.   Cardiovascular: Negative.  Negative for chest pain, palpitations, orthopnea, claudication, leg swelling and PND.  Gastrointestinal: Negative.  Negative for abdominal pain, blood in stool, constipation, diarrhea, heartburn, melena, nausea and vomiting.  Genitourinary: Negative.   Musculoskeletal: Negative.  Negative for falls, joint pain and myalgias.  Skin: Negative.  Negative for rash.  Neurological: Negative.  Negative for dizziness, tingling, sensory change, weakness and headaches.  Endo/Heme/Allergies: Negative.  Negative for polydipsia.   Psychiatric/Behavioral: Negative.  Negative for depression, memory loss, substance abuse and suicidal ideas. The patient is not nervous/anxious and does not have insomnia.   All other systems reviewed and are negative.   Physical Exam: Estimated body mass index is 33.52 kg/m as calculated from the following:   Height as of this encounter: 5\' 9"  (1.753 m).   Weight as of this encounter: 227 lb (103 kg). BP 110/70   Pulse 72   Temp 97.7 F (36.5 C)   Ht 5\' 9"  (1.753 m)   Wt 227 lb (103 kg)   SpO2 99%   BMI 33.52 kg/m  General Appearance: Well nourished, in no apparent distress. Eyes: PERRLA, EOMs, conjunctiva no swelling or erythema Sinuses: No Frontal/maxillary tenderness ENT/Mouth: Ext aud canals clear, normal light reflex with TMs without erythema, bulging.  Good dentition. No erythema, swelling, or exudate on post pharynx. Tonsils not swollen or erythematous. Hearing normal.  Neck: Supple, thyroid normal. No bruits Respiratory: Respiratory effort normal, BS equal bilaterally without rales, rhonchi, wheezing or stridor. Cardio: RRR without murmurs, rubs or gallops. Brisk peripheral pulses without edema.  Chest: symmetric, with normal excursions and percussion. Breasts: getting annual mammogram, no new concerns, declines Abdomen: Soft, +BS. Non tender, no guarding, rebound, hernias, masses, or organomegaly. Lymphatics: Non tender without lymphadenopathy.  Genitourinary: defer Musculoskeletal: Full ROM all peripheral extremities,5/5 strength, and normal gait. Skin: Warm, dry without rashes, lesions, ecchymosis. Seb keratoeses to back.  Neuro: Cranial nerves intact, reflexes equal bilaterally. Normal muscle tone, no cerebellar symptoms. Sensation intact.  Psych: Awake and oriented X 3, normal affect, Insight and Judgment appropriate.   EKG: WNL no ST changes   Gorden Harms Ianmichael Amescua 9:10 AM

## 2020-11-11 ENCOUNTER — Ambulatory Visit (INDEPENDENT_AMBULATORY_CARE_PROVIDER_SITE_OTHER): Payer: Medicare HMO | Admitting: Adult Health

## 2020-11-11 ENCOUNTER — Other Ambulatory Visit: Payer: Self-pay

## 2020-11-11 ENCOUNTER — Encounter: Payer: Self-pay | Admitting: Adult Health

## 2020-11-11 VITALS — BP 110/70 | HR 72 | Temp 97.7°F | Ht 69.0 in | Wt 227.0 lb

## 2020-11-11 DIAGNOSIS — R0989 Other specified symptoms and signs involving the circulatory and respiratory systems: Secondary | ICD-10-CM

## 2020-11-11 DIAGNOSIS — R7309 Other abnormal glucose: Secondary | ICD-10-CM | POA: Diagnosis not present

## 2020-11-11 DIAGNOSIS — Z0001 Encounter for general adult medical examination with abnormal findings: Secondary | ICD-10-CM

## 2020-11-11 DIAGNOSIS — E559 Vitamin D deficiency, unspecified: Secondary | ICD-10-CM

## 2020-11-11 DIAGNOSIS — Z1329 Encounter for screening for other suspected endocrine disorder: Secondary | ICD-10-CM

## 2020-11-11 DIAGNOSIS — E785 Hyperlipidemia, unspecified: Secondary | ICD-10-CM | POA: Diagnosis not present

## 2020-11-11 DIAGNOSIS — Z0000 Encounter for general adult medical examination without abnormal findings: Secondary | ICD-10-CM | POA: Diagnosis not present

## 2020-11-11 DIAGNOSIS — Z131 Encounter for screening for diabetes mellitus: Secondary | ICD-10-CM | POA: Diagnosis not present

## 2020-11-11 DIAGNOSIS — K760 Fatty (change of) liver, not elsewhere classified: Secondary | ICD-10-CM

## 2020-11-11 DIAGNOSIS — Z23 Encounter for immunization: Secondary | ICD-10-CM | POA: Diagnosis not present

## 2020-11-11 DIAGNOSIS — K219 Gastro-esophageal reflux disease without esophagitis: Secondary | ICD-10-CM | POA: Diagnosis not present

## 2020-11-11 DIAGNOSIS — Z9109 Other allergy status, other than to drugs and biological substances: Secondary | ICD-10-CM

## 2020-11-11 DIAGNOSIS — Z136 Encounter for screening for cardiovascular disorders: Secondary | ICD-10-CM | POA: Diagnosis not present

## 2020-11-11 DIAGNOSIS — N3281 Overactive bladder: Secondary | ICD-10-CM

## 2020-11-11 DIAGNOSIS — Z1389 Encounter for screening for other disorder: Secondary | ICD-10-CM

## 2020-11-11 DIAGNOSIS — E669 Obesity, unspecified: Secondary | ICD-10-CM

## 2020-11-11 NOTE — Patient Instructions (Signed)
  Kristina Hunter , Thank you for taking time to come for your Annual Wellness Visit. I appreciate your ongoing commitment to your health goals. Please review the following plan we discussed and let me know if I can assist you in the future.   These are the goals we discussed:  Goals      LDL CALC < 100     Weight (lb) < 215 lb (97.5 kg)        This is a list of the screening recommended for you and due dates:  Health Maintenance  Topic Date Due   Flu Shot  08/02/2020   COVID-19 Vaccine (1) 11/27/2020*   Zoster (Shingles) Vaccine (1 of 2) 08/02/2021*   Pneumonia Vaccine (2 - PCV) 08/02/2021   Mammogram  03/12/2022   Cologuard (Stool DNA test)  03/05/2023   Tetanus Vaccine  08/03/2030   DEXA scan (bone density measurement)  Completed   Hepatitis C Screening: USPSTF Recommendation to screen - Ages 31-79 yo.  Completed   HPV Vaccine  Aged Out  *Topic was postponed. The date shown is not the original due date.      Know what a healthy weight is for you (roughly BMI <25) and aim to maintain this  Aim for 7+ servings of fruits and vegetables daily  65-80+ fluid ounces of water or unsweet tea for healthy kidneys  Limit to max 1 drink of alcohol per day; avoid smoking/tobacco  Limit animal fats in diet for cholesterol and heart health - choose grass fed whenever available  Avoid highly processed foods, and foods high in saturated/trans fats  Aim for low stress - take time to unwind and care for your mental health  Aim for 150 min of moderate intensity exercise weekly for heart health, and weights twice weekly for bone health  Aim for 7-9 hours of sleep daily     A great goal to work towards is aiming to get in a serving daily of some of the most nutritionally dense foods - G- BOMBS daily

## 2020-11-12 ENCOUNTER — Other Ambulatory Visit: Payer: Self-pay | Admitting: Adult Health

## 2020-11-12 DIAGNOSIS — N289 Disorder of kidney and ureter, unspecified: Secondary | ICD-10-CM

## 2020-11-12 DIAGNOSIS — R801 Persistent proteinuria, unspecified: Secondary | ICD-10-CM

## 2020-11-13 LAB — URINALYSIS, ROUTINE W REFLEX MICROSCOPIC
Bacteria, UA: NONE SEEN /HPF
Bilirubin Urine: NEGATIVE
Glucose, UA: NEGATIVE
Hgb urine dipstick: NEGATIVE
Ketones, ur: NEGATIVE
Nitrite: NEGATIVE
Specific Gravity, Urine: 1.022 (ref 1.001–1.035)
pH: 5.5 (ref 5.0–8.0)

## 2020-11-13 LAB — COMPLETE METABOLIC PANEL WITH GFR
AG Ratio: 1.7 (calc) (ref 1.0–2.5)
ALT: 24 U/L (ref 6–29)
AST: 23 U/L (ref 10–35)
Albumin: 4.3 g/dL (ref 3.6–5.1)
Alkaline phosphatase (APISO): 78 U/L (ref 37–153)
BUN: 23 mg/dL (ref 7–25)
CO2: 23 mmol/L (ref 20–32)
Calcium: 9.1 mg/dL (ref 8.6–10.4)
Chloride: 105 mmol/L (ref 98–110)
Creat: 1.05 mg/dL (ref 0.50–1.05)
Globulin: 2.6 g/dL (calc) (ref 1.9–3.7)
Glucose, Bld: 98 mg/dL (ref 65–99)
Potassium: 4.6 mmol/L (ref 3.5–5.3)
Sodium: 140 mmol/L (ref 135–146)
Total Bilirubin: 0.4 mg/dL (ref 0.2–1.2)
Total Protein: 6.9 g/dL (ref 6.1–8.1)
eGFR: 58 mL/min/{1.73_m2} — ABNORMAL LOW (ref >=60)

## 2020-11-13 LAB — HEMOGLOBIN A1C
Hgb A1c MFr Bld: 5.6 % of total Hgb (ref <5.7)
Mean Plasma Glucose: 114 mg/dL
eAG (mmol/L): 6.3 mmol/L

## 2020-11-13 LAB — CBC WITH DIFFERENTIAL/PLATELET
Absolute Monocytes: 406 cells/uL (ref 200–950)
Basophils Absolute: 62 cells/uL (ref 0–200)
Basophils Relative: 1.2 %
Eosinophils Absolute: 213 cells/uL (ref 15–500)
Eosinophils Relative: 4.1 %
HCT: 43.6 % (ref 35.0–45.0)
Hemoglobin: 14.4 g/dL (ref 11.7–15.5)
Lymphs Abs: 1394 cells/uL (ref 850–3900)
MCH: 31 pg (ref 27.0–33.0)
MCHC: 33 g/dL (ref 32.0–36.0)
MCV: 93.8 fL (ref 80.0–100.0)
MPV: 10.5 fL (ref 7.5–12.5)
Monocytes Relative: 7.8 %
Neutro Abs: 3125 cells/uL (ref 1500–7800)
Neutrophils Relative %: 60.1 %
Platelets: 264 10*3/uL (ref 140–400)
RBC: 4.65 10*6/uL (ref 3.80–5.10)
RDW: 11.8 % (ref 11.0–15.0)
Total Lymphocyte: 26.8 %
WBC: 5.2 10*3/uL (ref 3.8–10.8)

## 2020-11-13 LAB — MICROALBUMIN / CREATININE URINE RATIO
Creatinine, Urine: 192 mg/dL (ref 20–275)
Microalb Creat Ratio: 19 mcg/mg creat (ref <30)
Microalb, Ur: 3.7 mg/dL

## 2020-11-13 LAB — TSH: TSH: 2.07 mIU/L (ref 0.40–4.50)

## 2020-11-13 LAB — LIPID PANEL
Cholesterol: 163 mg/dL (ref <200)
HDL: 48 mg/dL — ABNORMAL LOW (ref >=50)
LDL Cholesterol (Calc): 88 mg/dL (calc)
Non-HDL Cholesterol (Calc): 115 mg/dL (calc) (ref <130)
Total CHOL/HDL Ratio: 3.4 (calc) (ref <5.0)
Triglycerides: 167 mg/dL — ABNORMAL HIGH (ref <150)

## 2020-11-13 LAB — MAGNESIUM: Magnesium: 2.4 mg/dL (ref 1.5–2.5)

## 2020-11-13 LAB — VITAMIN D 25 HYDROXY (VIT D DEFICIENCY, FRACTURES): Vit D, 25-Hydroxy: 50 ng/mL (ref 30–100)

## 2020-11-17 ENCOUNTER — Ambulatory Visit
Admission: RE | Admit: 2020-11-17 | Discharge: 2020-11-17 | Disposition: A | Discharge status: Home / Self Care | Payer: Medicare HMO | Source: Ambulatory Visit | Attending: Adult Health | Admitting: Adult Health

## 2020-11-17 ENCOUNTER — Other Ambulatory Visit: Payer: Self-pay

## 2020-11-17 DIAGNOSIS — R801 Persistent proteinuria, unspecified: Secondary | ICD-10-CM

## 2020-11-17 DIAGNOSIS — R944 Abnormal results of kidney function studies: Secondary | ICD-10-CM | POA: Diagnosis not present

## 2020-11-17 DIAGNOSIS — N289 Disorder of kidney and ureter, unspecified: Secondary | ICD-10-CM

## 2020-12-02 ENCOUNTER — Other Ambulatory Visit: Payer: Self-pay

## 2020-12-02 ENCOUNTER — Encounter: Payer: Self-pay | Admitting: Adult Health

## 2020-12-02 ENCOUNTER — Ambulatory Visit (INDEPENDENT_AMBULATORY_CARE_PROVIDER_SITE_OTHER): Payer: Medicare HMO | Admitting: Adult Health

## 2020-12-02 VITALS — BP 130/74 | HR 73 | Temp 97.5°F | Wt 230.0 lb

## 2020-12-02 DIAGNOSIS — K143 Hypertrophy of tongue papillae: Secondary | ICD-10-CM | POA: Diagnosis not present

## 2020-12-02 MED ORDER — NYSTATIN 100000 UNIT/ML MT SUSP: OROMUCOSAL | 0 refills | Status: DC

## 2020-12-02 NOTE — Progress Notes (Signed)
Assessment and Plan:  Kristina Hunter was seen today for thrush.  Diagnoses and all orders for this visit:  Tongue coating Persistent green coating; Unclear etiology; will try nystatin swish and spit, but if no improvement will have her try 1/2 peroxide 1/2 mouth wash swish and spit twice daily per Dr. Melford Aase suggestion for possible pseudomonas overgrowth.  Sent message to update on progress/status Continue regular tongue brushing/scraping She was advised to keep dental appointment -     nystatin (MYCOSTATIN) 100000 UNIT/ML suspension; 5 ml four times a day, retain in mouth as long as possible (Swish and Spit).  Use for 48 hours after symptoms resolve.   Further disposition pending results of labs. Discussed med's effects and SE's.   Over 15 minutes of exam, counseling, chart review, and critical decision making was performed.   Future Appointments  Date Time Provider [place]  12/14/2020  9:00 AM [facility] NURSE [facility] None  05/18/2021 10:30 AM Magda Bernheim, NP [facility] None  11/11/2021  9:00 AM [name], NP [facility] None    ------------------------------------------------------------------------------------------------------------------   HPI BP 130/74   Pulse 73   Temp (!) 97.5 F (36.4 C)   Wt 230 lb (104.3 kg)   SpO2 99%   BMI 33.97 kg/m  65 y.o.female presents for evaluation of possible thrush.   She reports had sinus infection with fever a few weeks ago, treated with OTC agents; resolved but then developed painful blisters on roof of mouth, green coating to tongue.   She has been doing ambosol liquid and blisters and pain resolved, has been brushing tongue and using scraper but green/brown coating to tongue has been persistent.  She reports has upcoming routine dental visit in 2 weeks.   Past Medical History:  Diagnosis Date   Allergy    Cervical disc disorder    s/p fusion    Hyperlipidemia      No Known Allergies  Current Outpatient  Medications on File Prior to Visit  Medication Sig   BABY ASPIRIN PO Take 81 mg by mouth daily.   Calcium-Vitamin D (CALTRATE 600 PLUS-VIT D PO) Take by mouth daily.   CHOLECALCIFEROL PO Take 2,000 Units by mouth daily.   Cyanocobalamin (B-12) 1000 MCG TABS Take 1,000 mcg by mouth.   desloratadine (CLARINEX) 5 MG tablet TAKE 1 TABLET DAILY FOR    ALLERGIES   famotidine (PEPCID) 10 MG tablet Take 10 mg by mouth daily.   Multiple Vitamins-Minerals (MULTIVITAMIN PO) Take by mouth daily.   Omega-3 Fatty Acids (FISH OIL PO) Take by mouth daily.   rosuvastatin (CRESTOR) 10 MG tablet TAKE 1 TABLET DAILY FOR CHOLESTEROL   vitamin C (ASCORBIC ACID) 500 MG tablet Take 500 mg by mouth daily.   Zinc 25 MG TABS Take 1 tablet by mouth daily.   No current facility-administered medications on file prior to visit.    ROS: all negative except above.   Physical Exam:  BP 130/74   Pulse 73   Temp (!) 97.5 F (36.4 C)   Wt 230 lb (104.3 kg)   SpO2 99%   BMI 33.97 kg/m   General Appearance: Well nourished, in no apparent distress. Eyes: PERRL, conjunctiva no swelling or erythema Sinuses: No Frontal/maxillary tenderness ENT/Mouth: No erythema, swelling, or exudate on post pharynx. Tongue with deep greek/dark brown coating mid to posterior. Tonsils not swollen or erythematous. Hearing normal.  Neck: Supple,  Respiratory: Respiratory effort normal, BS equal bilaterally without rales, rhonchi, wheezing or stridor.  Cardio: Appears well perfused  Lymphatics: Non tender without lymphadenopathy.  Musculoskeletal: normal gait.  Skin: Warm, dry without rashes, lesions, ecchymosis.  Neuro: Normal muscle tone Psych: Awake and oriented X 3, normal affect, Insight and Judgment appropriate.     Izora Ribas, NP 1:54 PM [facility]

## 2020-12-06 ENCOUNTER — Other Ambulatory Visit: Payer: Self-pay | Admitting: Adult Health

## 2020-12-06 ENCOUNTER — Other Ambulatory Visit: Payer: Self-pay

## 2020-12-06 MED ORDER — NYSTATIN 100000 UNIT/ML MT SUSP: OROMUCOSAL | 0 refills | Status: DC

## 2020-12-14 ENCOUNTER — Ambulatory Visit (INDEPENDENT_AMBULATORY_CARE_PROVIDER_SITE_OTHER): Payer: Medicare HMO

## 2020-12-14 ENCOUNTER — Other Ambulatory Visit: Payer: Self-pay

## 2020-12-14 DIAGNOSIS — N289 Disorder of kidney and ureter, unspecified: Secondary | ICD-10-CM

## 2020-12-14 DIAGNOSIS — R801 Persistent proteinuria, unspecified: Secondary | ICD-10-CM

## 2020-12-14 NOTE — Progress Notes (Signed)
The patient came in for follow-up lab work per [name], NP. The patient had no questions or concerns.

## 2020-12-16 DIAGNOSIS — R801 Persistent proteinuria, unspecified: Secondary | ICD-10-CM | POA: Diagnosis not present

## 2020-12-16 DIAGNOSIS — N289 Disorder of kidney and ureter, unspecified: Secondary | ICD-10-CM | POA: Diagnosis not present

## 2020-12-17 LAB — PROTEIN, URINE, 24 HOUR: Protein, 24H Urine: 132 mg/24 h (ref 0–149)

## 2020-12-17 LAB — PROTEIN,TOTAL AND PROTEIN ELECTROPHORESIS, RANDOM URINE(REFL)
Creatinine, Urine: 85 mg/dL (ref 20–275)
Protein/Creat Ratio: 71 mg/g{creat} (ref 24–184)
Protein/Creatinine Ratio: 0.071 mg/mg{creat} (ref 0.024–0.184)
Total Protein, Urine: 6 mg/dL (ref 5–24)

## 2020-12-17 LAB — BASIC METABOLIC PANEL WITH GFR
BUN: 21 mg/dL (ref 7–25)
CO2: 24 mmol/L (ref 20–32)
Calcium: 9.2 mg/dL (ref 8.6–10.4)
Chloride: 105 mmol/L (ref 98–110)
Creat: 0.86 mg/dL (ref 0.50–1.05)
Glucose, Bld: 133 mg/dL — ABNORMAL HIGH (ref 65–99)
Potassium: 4.2 mmol/L (ref 3.5–5.3)
Sodium: 140 mmol/L (ref 135–146)
eGFR: 74 mL/min/{1.73_m2} (ref >=60)

## 2021-01-04 DIAGNOSIS — Z23 Encounter for immunization: Secondary | ICD-10-CM | POA: Diagnosis not present

## 2021-02-02 ENCOUNTER — Other Ambulatory Visit: Payer: Self-pay | Admitting: Internal Medicine

## 2021-02-02 DIAGNOSIS — Z1231 Encounter for screening mammogram for malignant neoplasm of breast: Secondary | ICD-10-CM

## 2021-02-17 DIAGNOSIS — L821 Other seborrheic keratosis: Secondary | ICD-10-CM | POA: Diagnosis not present

## 2021-02-17 DIAGNOSIS — L218 Other seborrheic dermatitis: Secondary | ICD-10-CM | POA: Diagnosis not present

## 2021-02-17 DIAGNOSIS — D225 Melanocytic nevi of trunk: Secondary | ICD-10-CM | POA: Diagnosis not present

## 2021-02-17 DIAGNOSIS — M71372 Other bursal cyst, left ankle and foot: Secondary | ICD-10-CM | POA: Diagnosis not present

## 2021-02-17 DIAGNOSIS — L920 Granuloma annulare: Secondary | ICD-10-CM | POA: Diagnosis not present

## 2021-03-10 DIAGNOSIS — R69 Illness, unspecified: Secondary | ICD-10-CM | POA: Diagnosis not present

## 2021-03-14 ENCOUNTER — Ambulatory Visit
Admission: RE | Admit: 2021-03-14 | Discharge: 2021-03-14 | Disposition: A | Discharge status: Home / Self Care | Payer: Medicare HMO | Source: Ambulatory Visit | Attending: Internal Medicine | Admitting: Internal Medicine

## 2021-03-14 ENCOUNTER — Other Ambulatory Visit: Payer: Self-pay

## 2021-03-14 DIAGNOSIS — Z1231 Encounter for screening mammogram for malignant neoplasm of breast: Secondary | ICD-10-CM

## 2021-03-31 DIAGNOSIS — R69 Illness, unspecified: Secondary | ICD-10-CM | POA: Diagnosis not present

## 2021-05-17 NOTE — Progress Notes (Deleted)
MEDICARE ANNUAL WELLNESS VISIT AND FOLLOW UP  Assessment:   Kristina Hunter was seen today for medicare wellness and follow-up.  Diagnoses and all orders for this visit:  Encounter for medicare annual wellness exam Due yearly  Labile hypertension Improved with lifestyle Monitor blood pressure at home; call if consistently over 130/80 Continue DASH diet.   Reminder to go to the ER if any CP, SOB, nausea, dizziness, severe HA, changes vision/speech, left arm numbness and tingling and jaw pain. -     CBC with Differential/Platelet -     COMPLETE METABOLIC PANEL WITH GFR -     EKG 12-Lead -     [place], RETROPERITNL ABD,  LTD  Gastroesophageal reflux disease, unspecified whether esophagitis present Well managed on current medications Discussed diet, avoiding triggers and other lifestyle changes  OAB (overactive bladder) Mild symptoms managed by lifestyle; declines meds at this time  Vitamin D deficiency continue to recommend supplementation for goal of 60-100 Defer vitamin D level   Other abnormal glucose Prediabetes Discussed disease and risks Discussed diet/exercise, weight management  A1C q53m-     COMPLETE METABOLIC PANEL WITH GFR -     Hemoglobin A1c  Obesity (BMI 30.0-34.9) Long discussion about weight loss, diet, and exercise Recommended diet heavy in fruits and veggies and low in animal meats, cheeses, and dairy products, appropriate calorie intake Discussed appropriate weight for heightand initial goal (<200 lb) Follow up at next visit  Hyperlipidemia, unspecified hyperlipidemia type Continue medication Continue low cholesterol diet and exercise.  Check lipid panel.  -     Lipid panel -     TSH  Environmental allergies Avoid triggers, hygiene reviewed, continue antihistamine   Medication Management Continued   Over 40 minutes of exam, counseling, chart review and critical decision making was performed Future Appointments  Date Time Provider [place]  05/18/2021 10:30 AM MMagda Bernheim NP [facility] None  11/11/2021  9:00 AM CLiane Comber NP [facility] None     Plan:   During the course of the visit the patient was educated and counseled about appropriate screening and preventive services including:   Pneumococcal vaccine  Prevnar 13 Influenza vaccine Td vaccine Screening electrocardiogram Bone densitometry screening Colorectal cancer screening Diabetes screening Glaucoma screening Nutrition counseling  Advanced directives: requested   Subjective:  Kristina MAYBERRYis a 66y.o. female who presents for Medicare Annual Wellness Visit and 3 month follow up.   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 Has 3 kids, 5 grandsons and 3 grand daughters. Wants to travel has camper and wants to drive the [place] Mother passed away [phone]   She does take famotidine 10 mg PRN for reflux symptoms, has improved persistent cough.   She was on myrbetriq for OAB/incontinence but didn't feel this helped. Previously on vesicare which worked better but cost prohibitive. Feels she is doing fairly without agent at this time. She wears a panty liner, minimal leakage.   BMI is There is no height or weight on file to calculate BMI., she has been working on diet and exercise, she is golfing 4 hours 3 days a week since not going to the gym. Plans to start walking as well.  She has been cutting sugar, avoiding starches. She reports 65+ fluid ounces of water daily. She drinks occasional ice tea with splenda. She eats 5+ servings of fruits vegetables.  Wt Readings from Last 3 Encounters:  12/14/20 231 lb 3.2  oz (104.9 kg)  12/02/20 230 lb (104.3 kg)  11/11/20 227 lb (103 kg)    Her blood pressure has been controlled at home, today their BP is   She does workout. She denies chest pain, shortness of breath, dizziness.   She is on cholesterol medication (rosuvastatin 10 mg daily) and denies myalgias. Her  cholesterol is at goal. The cholesterol last visit was:   Lab Results  Component Value Date   CHOL 163 11/11/2020   HDL 48 (L) 11/11/2020   LDLCALC 88 11/11/2020   TRIG 167 (H) 11/11/2020   CHOLHDL 3.4 11/11/2020   She has had prediabetes predating 2015. She has been working on diet and exercise for prediabetes, and denies increased appetite, nausea, paresthesia of the feet, polydipsia, polyuria, visual disturbances, vomiting and weight loss. Last A1C in the office was:  Lab Results  Component Value Date   HGBA1C 5.6 11/11/2020   Reports 64+ fluid ounces minimum daily. Last GFR: Lab Results  Component Value Date   GFRNONAA 73 03/17/2020   Patient is on Vitamin D supplement.   Lab Results  Component Value Date   VD25OH 50 11/11/2020      Medication Review: Current Outpatient Medications on File Prior to Visit  Medication Sig Dispense Refill   BABY ASPIRIN PO Take 81 mg by mouth daily.     Calcium-Vitamin D (CALTRATE 600 PLUS-VIT D PO) Take by mouth daily.     CHOLECALCIFEROL PO Take 2,000 Units by mouth daily.     Cyanocobalamin (B-12) 1000 MCG TABS Take 1,000 mcg by mouth.     desloratadine (CLARINEX) 5 MG tablet TAKE 1 TABLET DAILY FOR    ALLERGIES 90 tablet 3   famotidine (PEPCID) 10 MG tablet Take 10 mg by mouth daily.     Multiple Vitamins-Minerals (MULTIVITAMIN PO) Take by mouth daily.     nystatin (MYCOSTATIN) 100000 UNIT/ML suspension 5 ml four times a day, retain in mouth as long as possible (Swish and Spit).  Use for 48 hours after symptoms resolve. 80 mL 0   Omega-3 Fatty Acids (FISH OIL PO) Take by mouth daily.     rosuvastatin (CRESTOR) 10 MG tablet TAKE 1 TABLET DAILY FOR CHOLESTEROL 90 tablet 3   vitamin C (ASCORBIC ACID) 500 MG tablet Take 500 mg by mouth daily.     Zinc 25 MG TABS Take 1 tablet by mouth daily.     No current facility-administered medications on file prior to visit.    No Known Allergies  Current Problems (verified) Patient Active Problem  List   Diagnosis Date Noted   Persistent proteinuria 11/12/2020   Hepatic steatosis 05/13/2019   Acid reflux 05/06/2019   Vitamin D deficiency 05/14/2018   OAB (overactive bladder) 11/06/2017   Other abnormal glucose 10/20/2014   Obesity (BMI 30.0-34.9) 10/20/2014   Labile hypertension 10/20/2014   Hyperlipidemia    Environmental allergies     Screening Tests Immunization History  Administered Date(s) Administered   Influenza Inj Mdck Quad With Preservative 10/24/2016, 11/07/2017   Influenza Split 10/20/2014   Influenza, High Dose Seasonal PF 11/11/2018, 11/11/2019, 11/11/2020   Influenza, Seasonal, Injecte, Preservative Fre 10/20/2015   Pneumococcal Polysaccharide-23 08/02/2020   Tdap 07/13/2009, 08/02/2020   Tetanus: 2011 Pneumovax: 1997, due in 1 year from [place] 13: DUE -  Flu vaccine: 11/2018 Zostavax: declines  covid 19: declines at this time   Pap: 2011 declines had hysterectomy, no further per GYN MGM: 01/2019 DEXA: 01/2019, T -0.5, normal  Colonoscopy:  2006  Cologuard: 02/2017 neg due 2022 EGD: N/A  MRI brain 2002 MRI cervical spine 2002  Names of Other Physician/Practitioners you currently use: 1. [facility] here for primary care 2. [name], eye doctor, last visit 06/2018, will schedule  3. [name], dentist, last visit 11/2018, will be changing due to new insurance  4. [name], derm, last visit 2021, goes annually   Patient Care Team: [name], MD as PCP - General (Internal Medicine) [name], MD as Consulting Physician (Orthopedic Surgery) [name], MD as Consulting Physician (Dermatology) [name], [name], MD as Consulting Physician (Gastroenterology)  SURGICAL HISTORY She  has a past surgical history that includes Tubal ligation (Bilateral, 1986); Abdominal hysterectomy (1990); and Cervical disc surgery (2000). FAMILY HISTORY Her family history includes  Arthritis in her father and mother; Dementia in her mother; Diabetes in her father; Heart attack in her paternal grandfather; Hyperlipidemia in her father and mother; Hypertension in her father and mother. SOCIAL HISTORY She  reports that she has never smoked. She has never used smokeless tobacco. She reports that she does not drink alcohol and does not use drugs.   MEDICARE WELLNESS OBJECTIVES: Physical activity:   Cardiac risk factors:   Depression/mood screen:      11/11/2020    9:09 AM  Depression screen PHQ 2/9  Decreased Interest 0  Down, Depressed, Hopeless 0  PHQ - 2 Score 0    ADLs:     08/02/2020   10:18 AM  In your present state of health, do you have any difficulty performing the following activities:  Hearing? 0  Vision? 0  Difficulty concentrating or making decisions? 0  Walking or climbing stairs? 0  Dressing or bathing? 0  Doing errands, shopping? 0     Cognitive Testing  Alert? Yes  Normal Appearance?Yes  Oriented to person? Yes  Place? Yes   Time? Yes  Recall of three objects?  Yes  Can perform simple calculations? Yes  Displays appropriate judgment?Yes  Can read the correct time from a watch face?Yes  EOL planning:    Review of Systems  Constitutional:  Negative for malaise/fatigue and weight loss.  HENT:  Negative for hearing loss and tinnitus.   Eyes:  Negative for blurred vision and double vision.  Respiratory:  Negative for cough, sputum production, shortness of breath and wheezing.   Cardiovascular:  Negative for chest pain, palpitations, orthopnea, claudication, leg swelling and PND.  Gastrointestinal:  Negative for abdominal pain, blood in stool, constipation, diarrhea, heartburn, melena, nausea and vomiting.  Genitourinary: Negative.   Musculoskeletal:  Negative for falls, joint pain and myalgias.  Skin:  Negative for rash.  Neurological:  Negative for dizziness, tingling, sensory change, weakness and headaches.  Endo/Heme/Allergies:   Positive for environmental allergies. Negative for polydipsia.  Psychiatric/Behavioral: Negative.  Negative for depression, memory loss, substance abuse and suicidal ideas. The patient is not nervous/anxious and does not have insomnia.   All other systems reviewed and are negative.   Objective:     There were no vitals filed for this visit.  There is no height or weight on file to calculate BMI.  General appearance: alert, no distress, WD/WN, female HEENT: normocephalic, sclerae anicteric, TMs pearly, nares patent, no discharge or erythema, pharynx normal Oral cavity: MMM, no lesions Neck: supple, no lymphadenopathy, no thyromegaly, no masses Heart: RRR, normal S1, S2, no murmurs Lungs: CTA bilaterally, no wheezes, rhonchi, or rales Abdomen: +bs, soft, non tender, non distended,  no masses, no hepatomegaly, no splenomegaly Musculoskeletal: nontender, no swelling, no obvious deformity Extremities: no edema, no cyanosis, no clubbing Pulses: 2+ symmetric, upper and lower extremities, normal cap refill Neurological: alert, oriented x 3, CN2-12 intact, strength normal upper extremities and lower extremities, sensation normal throughout, DTRs 2+ throughout, no cerebellar signs, gait normal Psychiatric: normal affect, behavior normal, pleasant   Medicare Attestation I have personally reviewed: The patient's medical and social history Their use of alcohol, tobacco or illicit drugs Their current medications and supplements The patient's functional ability including ADLs,fall risks, home safety risks, cognitive, and hearing and visual impairment Diet and physical activities Evidence for depression or mood disorders  The patient's weight, height, BMI, and visual acuity have been recorded in the chart.  I have made referrals, counseling, and provided education to the patient based on review of the above and I have provided the patient with a written personalized care plan for preventive services.      Magda Bernheim, NP   05/17/2021

## 2021-05-18 ENCOUNTER — Ambulatory Visit: Payer: Medicare HMO | Admitting: Nurse Practitioner

## 2021-05-18 DIAGNOSIS — E669 Obesity, unspecified: Secondary | ICD-10-CM

## 2021-05-18 DIAGNOSIS — Z79899 Other long term (current) drug therapy: Secondary | ICD-10-CM

## 2021-05-18 DIAGNOSIS — R0989 Other specified symptoms and signs involving the circulatory and respiratory systems: Secondary | ICD-10-CM

## 2021-05-18 DIAGNOSIS — N3281 Overactive bladder: Secondary | ICD-10-CM

## 2021-05-18 DIAGNOSIS — K760 Fatty (change of) liver, not elsewhere classified: Secondary | ICD-10-CM

## 2021-05-18 DIAGNOSIS — E782 Mixed hyperlipidemia: Secondary | ICD-10-CM

## 2021-05-18 DIAGNOSIS — Z0000 Encounter for general adult medical examination without abnormal findings: Secondary | ICD-10-CM

## 2021-05-18 DIAGNOSIS — K219 Gastro-esophageal reflux disease without esophagitis: Secondary | ICD-10-CM

## 2021-05-18 DIAGNOSIS — Z9109 Other allergy status, other than to drugs and biological substances: Secondary | ICD-10-CM

## 2021-05-18 DIAGNOSIS — R7309 Other abnormal glucose: Secondary | ICD-10-CM

## 2021-07-21 ENCOUNTER — Ambulatory Visit: Payer: Medicare HMO | Admitting: Adult Health

## 2021-08-02 ENCOUNTER — Ambulatory Visit: Payer: Medicare HMO | Admitting: Nurse Practitioner

## 2021-10-05 DIAGNOSIS — H524 Presbyopia: Secondary | ICD-10-CM | POA: Diagnosis not present

## 2021-11-11 ENCOUNTER — Ambulatory Visit (INDEPENDENT_AMBULATORY_CARE_PROVIDER_SITE_OTHER): Payer: Medicare HMO | Admitting: Nurse Practitioner

## 2021-11-11 ENCOUNTER — Encounter: Payer: Self-pay | Admitting: Nurse Practitioner

## 2021-11-11 VITALS — BP 118/72 | HR 72 | Temp 97.3°F | Resp 16 | Ht 69.0 in | Wt 226.4 lb

## 2021-11-11 DIAGNOSIS — K760 Fatty (change of) liver, not elsewhere classified: Secondary | ICD-10-CM

## 2021-11-11 DIAGNOSIS — E785 Hyperlipidemia, unspecified: Secondary | ICD-10-CM | POA: Diagnosis not present

## 2021-11-11 DIAGNOSIS — Z1329 Encounter for screening for other suspected endocrine disorder: Secondary | ICD-10-CM | POA: Diagnosis not present

## 2021-11-11 DIAGNOSIS — E559 Vitamin D deficiency, unspecified: Secondary | ICD-10-CM | POA: Diagnosis not present

## 2021-11-11 DIAGNOSIS — Z0000 Encounter for general adult medical examination without abnormal findings: Secondary | ICD-10-CM

## 2021-11-11 DIAGNOSIS — Z136 Encounter for screening for cardiovascular disorders: Secondary | ICD-10-CM | POA: Diagnosis not present

## 2021-11-11 DIAGNOSIS — Z131 Encounter for screening for diabetes mellitus: Secondary | ICD-10-CM | POA: Diagnosis not present

## 2021-11-11 DIAGNOSIS — R7309 Other abnormal glucose: Secondary | ICD-10-CM | POA: Diagnosis not present

## 2021-11-11 DIAGNOSIS — Z79899 Other long term (current) drug therapy: Secondary | ICD-10-CM | POA: Diagnosis not present

## 2021-11-11 DIAGNOSIS — E669 Obesity, unspecified: Secondary | ICD-10-CM

## 2021-11-11 DIAGNOSIS — Z23 Encounter for immunization: Secondary | ICD-10-CM

## 2021-11-11 DIAGNOSIS — Z1389 Encounter for screening for other disorder: Secondary | ICD-10-CM | POA: Diagnosis not present

## 2021-11-11 DIAGNOSIS — R0989 Other specified symptoms and signs involving the circulatory and respiratory systems: Secondary | ICD-10-CM | POA: Diagnosis not present

## 2021-11-11 DIAGNOSIS — Z0001 Encounter for general adult medical examination with abnormal findings: Secondary | ICD-10-CM

## 2021-11-11 DIAGNOSIS — Z9109 Other allergy status, other than to drugs and biological substances: Secondary | ICD-10-CM

## 2021-11-11 NOTE — Progress Notes (Signed)
Complete Physical  Assessment and Plan:  Encounter for Annual Physical Exam with abnormal findings Due annually  Health Maintenance reviewed Healthy lifestyle reviewed and goals set  Labile hypertension Well controlled off medication Discussed DASH (Dietary Approaches to Stop Hypertension) DASH diet is lower in sodium than a typical American diet. Cut back on foods that are high in saturated fat, cholesterol, and trans fats. Eat more whole-grain foods, fish, poultry, and nuts Remain active and exercise as tolerated daily.  Monitor BP at home-Call if greater than 130/80.  Check CMP/CBC   Hyperlipidemia Discussed lifestyle modifications. Recommended diet heavy in fruits and veggies, omega 3's. Decrease consumption of animal meats, cheeses, and dairy products. Remain active and exercise as tolerated. Continue to monitor. Check lipids/TSH   Other abnormal glucose Education: Reviewed 'ABCs' of diabetes management  Discussed goals to be met and/or maintained include A1C (<7) Blood pressure (<130/80) Cholesterol (LDL <70) Continue Eye Exam yearly  Continue Dental Exam Q6 mo Discussed dietary recommendations Discussed Physical Activity recommendations Foot exam UTD Check A1C    Obesity, BMI 33 Discussed appropriate BMI Goal of losing 1 lb per month. Diet modification. Physical activity. Encouraged/praised to build confidence.  Hepatic steatosis Weight loss advised, avoid alcohol/tylenol, will monitor LFTs  Allergy, subsequent encounter Continue daily antihistamine Avoid triggers  Medication management All medications discussed and reviewed in full. All questions and concerns regarding medications addressed.      Vitamin D deficiency Continue supplement Continue to monitor   Need for influenza vaccine High dose quadrivalent administered without complication today   Screening for diabetes mellitus Check and Monitor A1c  Screening for hematuria or  proteinuria Check and Monitor UA/Microalbumin/CMP  Screening for cardiovascular disease Check and Monitor EKG  Screening for thyroid disorder Check and monitor TSH  Orders Placed This Encounter  Procedures   Flu vaccine HIGH DOSE PF (Fluzone High dose)   CBC with Differential/Platelet   COMPLETE METABOLIC PANEL WITH GFR   Magnesium   Lipid panel   TSH   Hemoglobin A1c   Insulin, random   VITAMIN D 25 Hydroxy (Vit-D Deficiency, Fractures)   Urinalysis, Routine w reflex microscopic   Microalbumin / creatinine urine ratio   EKG 12-Lead    Notify office for further evaluation and treatment, questions or concerns if any reported s/s fail to improve.   The patient was advised to call back or seek an in-person evaluation if any symptoms worsen or if the condition fails to improve as anticipated.   Further disposition pending results of labs. Discussed med's effects and SE's.    I discussed the assessment and treatment plan with the patient. The patient was provided an opportunity to ask questions and all were answered. The patient agreed with the plan and demonstrated an understanding of the instructions.  Discussed med's effects and SE's. Screening labs and tests as requested with regular follow-up as recommended.  I provided 35 minutes of face-to-face time during this encounter including counseling, chart review, and critical decision making was preformed.  Future Appointments  Date Time Provider [place]  05/19/2022 11:00 AM Alycia Rossetti, NP [facility] None  11/14/2022  9:00 AM Darrol Jump, NP [facility] None    HPI 66 y.o. female  presents for a complete physical. She has Hyperlipidemia; Environmental allergies; Other abnormal glucose; Obesity (BMI 30.0-34.9); Labile hypertension; OAB (overactive bladder); Vitamin D deficiency; Acid reflux; Hepatic steatosis; and Persistent proteinuria on their problem list.  She works at [employer], doing accounts payable  retired 4 years  ago.  Enjoys camping and taking care of grandchildren.    She does take famotidine 10 mg daily for GERD tmt.  BMI is Body mass index is 33.43 kg/m., she has been working on diet and exercise. Wt Readings from Last 3 Encounters:  11/11/21 226 lb 6.4 oz (102.7 kg)  12/14/20 231 lb 3.2 oz (104.9 kg)  12/02/20 230 lb (104.3 kg)    Her blood pressure has been controlled at home, today their BP is BP: 118/72 She does workout, does golfing 3 days a week. She denies chest pain, shortness of breath, dizziness.   She is on cholesterol medication rosuvastatin 10 mg daily and denies myalgias. Her cholesterol is not at goal. The cholesterol last visit was:   Lab Results  Component Value Date   CHOL 163 11/11/2020   HDL 48 (L) 11/11/2020   LDLCALC 88 11/11/2020   TRIG 167 (H) 11/11/2020   CHOLHDL 3.4 11/11/2020    She has been working on diet and exercise for glucose management, and denies paresthesia of the feet, polydipsia and polyuria. Last A1C in the office was:  Lab Results  Component Value Date   HGBA1C 5.6 11/11/2020   Lab Results  Component Value Date   GFRNONAA 73 03/17/2020   Patient is on Vitamin D supplement, currently taking 2000 IU daily   Lab Results  Component Value Date   VD25OH 50 11/11/2020    Current Medications:  Current Outpatient Medications on File Prior to Visit  Medication Sig Dispense Refill   BABY ASPIRIN PO Take 81 mg by mouth daily.     CHOLECALCIFEROL PO Take 2,000 Units by mouth daily.     Cyanocobalamin (B-12) 1000 MCG TABS Take 1,000 mcg by mouth.     desloratadine (CLARINEX) 5 MG tablet TAKE 1 TABLET DAILY FOR    ALLERGIES 90 tablet 3   famotidine (PEPCID) 10 MG tablet Take 10 mg by mouth daily.     Multiple Vitamins-Minerals (MULTIVITAMIN PO) Take by mouth daily.     nystatin (MYCOSTATIN) 100000 UNIT/ML suspension 5 ml four times a day, retain in mouth as long as possible (Swish and Spit).  Use for 48 hours after symptoms resolve.  80 mL 0   Omega-3 Fatty Acids (FISH OIL PO) Take by mouth daily.     rosuvastatin (CRESTOR) 10 MG tablet TAKE 1 TABLET DAILY FOR CHOLESTEROL 90 tablet 3   vitamin C (ASCORBIC ACID) 500 MG tablet Take 500 mg by mouth daily.     Zinc 25 MG TABS Take 1 tablet by mouth daily.     Calcium-Vitamin D (CALTRATE 600 PLUS-VIT D PO) Take by mouth daily.     No current facility-administered medications on file prior to visit.   Health Maintenance:   Immunization History  Administered Date(s) Administered   Influenza Inj Mdck Quad With Preservative 10/24/2016, 11/07/2017   Influenza Split 10/20/2014   Influenza, High Dose Seasonal PF 11/11/2018, 11/11/2019, 11/11/2020   Influenza, Seasonal, Injecte, Preservative Fre 10/20/2015   Pneumococcal Polysaccharide-23 08/02/2020   Tdap 07/13/2009, 08/02/2020   Tetanus: 08/2020 Pneumovax: 1997, 2022 Prevnar 13: declines today, will think about  Flu vaccine: Due Today 11/2021 Shingrix: had first, scheduled for the second, getting at pharmacy  covid 19: declines at this time   Pap: 2011 declines had hysterectomy, no further per GYN MGM: 03/2021 DEXA: 01/2019, T -0.5, normal Due she will reach out to GYN for 2024  Colonoscopy: 2006  Cologuard: 03/2020 Negative 2025 Due EGD: N/A  MRI brain  2002 MRI cervical spine 2002  Names of Other Physician/Practitioners you currently use: 1. [facility] here for primary care 2. [name], eye doctor, last visit 10/2021 3. [facility], dentist, last visit 2022 4. [name], derm, last visit 02/2021, goes annually   Patient Care Team: [name], MD as PCP - General (Internal Medicine) [name], MD as Consulting Physician (Orthopedic Surgery) [name], MD as Consulting Physician (Dermatology) [name], [name], MD as Consulting Physician (Gastroenterology)  Medical History:  Past Medical History:  Diagnosis Date   Allergy    Cervical  disc disorder    s/p fusion    Hyperlipidemia    Allergies No Known Allergies  SURGICAL HISTORY She  has a past surgical history that includes Tubal ligation (Bilateral, 1986); Abdominal hysterectomy (1990); and Cervical disc surgery (2000). FAMILY HISTORY Her family history includes Arthritis in her father and mother; Dementia in her mother; Diabetes in her father; Heart attack in her paternal grandfather; Hyperlipidemia in her father and mother; Hypertension in her father and mother. SOCIAL HISTORY She  reports that she has never smoked. She has never used smokeless tobacco. She reports that she does not drink alcohol and does not use drugs.   Review of Systems  Constitutional: Negative.  Negative for malaise/fatigue and weight loss.  HENT: Negative.  Negative for hearing loss and tinnitus.   Eyes: Negative.  Negative for blurred vision and double vision.  Respiratory: Negative.  Negative for cough, sputum production, shortness of breath and wheezing.   Cardiovascular: Negative.  Negative for chest pain, palpitations, orthopnea, claudication, leg swelling and PND.  Gastrointestinal: Negative.  Negative for abdominal pain, blood in stool, constipation, diarrhea, heartburn, melena, nausea and vomiting.  Genitourinary: Negative.   Musculoskeletal: Negative.  Negative for falls, joint pain and myalgias.  Skin: Negative.  Negative for rash.  Neurological: Negative.  Negative for dizziness, tingling, sensory change, weakness and headaches.  Endo/Heme/Allergies: Negative.  Negative for polydipsia.  Psychiatric/Behavioral: Negative.  Negative for depression, memory loss, substance abuse and suicidal ideas. The patient is not nervous/anxious and does not have insomnia.   All other systems reviewed and are negative.    Physical Exam: Estimated body mass index is 33.43 kg/m as calculated from the following:   Height as of this encounter: _0  (1.753 m).   Weight as of this encounter: 226  lb 6.4 oz (102.7 kg). BP 118/72   Pulse 72   Temp (!) 97.3 F (36.3 C)   Resp 16   Ht _1  (1.753 m)   Wt 226 lb 6.4 oz (102.7 kg)   SpO2 96%   BMI 33.43 kg/m  General Appearance: Well nourished, in no apparent distress. Eyes: PERRLA, EOMs, conjunctiva no swelling or erythema Sinuses: No Frontal/maxillary tenderness ENT/Mouth: Ext aud canals clear, normal light reflex with TMs without erythema, bulging.  Good dentition. No erythema, swelling, or exudate on post pharynx. Tonsils not swollen or erythematous. Hearing normal.  Neck: Supple, thyroid normal. No bruits Respiratory: Respiratory effort normal, BS equal bilaterally without rales, rhonchi, wheezing or stridor. Cardio: RRR without murmurs, rubs or gallops. Brisk peripheral pulses without edema.  Chest: symmetric, with normal excursions and percussion. Breasts: getting annual mammogram, no new concerns, declines Abdomen: Soft, +BS. Non tender, no guarding, rebound, hernias, masses, or organomegaly. Lymphatics: Non tender without lymphadenopathy.  Genitourinary: defer Musculoskeletal: Full ROM all peripheral extremities,5/5 strength, and normal gait. Skin: Warm, dry without rashes, lesions, ecchymosis. Seb keratoeses to back.  Neuro: Cranial nerves intact, reflexes equal bilaterally. Normal muscle tone, no cerebellar symptoms. Sensation intact.  Psych: Awake and oriented X 3, normal affect, Insight and Judgment appropriate.   EKG: WNL no ST changes   Rayvin Abid 9:42 AM

## 2021-11-12 ENCOUNTER — Telehealth: Payer: Medicare HMO | Admitting: Nurse Practitioner

## 2021-11-12 DIAGNOSIS — R399 Unspecified symptoms and signs involving the genitourinary system: Secondary | ICD-10-CM

## 2021-11-12 LAB — CBC WITH DIFFERENTIAL/PLATELET
Absolute Monocytes: 405 cells/uL (ref 200–950)
Basophils Absolute: 59 cells/uL (ref 0–200)
Basophils Relative: 1.1 %
Eosinophils Absolute: 140 cells/uL (ref 15–500)
Eosinophils Relative: 2.6 %
HCT: 43.6 % (ref 35.0–45.0)
Hemoglobin: 15 g/dL (ref 11.7–15.5)
Lymphs Abs: 1442 cells/uL (ref 850–3900)
MCH: 31.6 pg (ref 27.0–33.0)
MCHC: 34.4 g/dL (ref 32.0–36.0)
MCV: 91.8 fL (ref 80.0–100.0)
MPV: 10.7 fL (ref 7.5–12.5)
Monocytes Relative: 7.5 %
Neutro Abs: 3353 cells/uL (ref 1500–7800)
Neutrophils Relative %: 62.1 %
Platelets: 271 10*3/uL (ref 140–400)
RBC: 4.75 10*6/uL (ref 3.80–5.10)
RDW: 11.5 % (ref 11.0–15.0)
Total Lymphocyte: 26.7 %
WBC: 5.4 10*3/uL (ref 3.8–10.8)

## 2021-11-12 LAB — URINALYSIS, ROUTINE W REFLEX MICROSCOPIC
Bilirubin Urine: NEGATIVE
Glucose, UA: NEGATIVE
Hgb urine dipstick: NEGATIVE
Ketones, ur: NEGATIVE
Nitrite: NEGATIVE
Protein, ur: NEGATIVE
RBC / HPF: NONE SEEN /HPF (ref 0–2)
Specific Gravity, Urine: 1.018 (ref 1.001–1.035)
Squamous Epithelial / HPF: NONE SEEN /HPF (ref <=5)
WBC, UA: >=60 /HPF — AB (ref 0–5)
pH: 5.5 (ref 5.0–8.0)

## 2021-11-12 LAB — INSULIN, RANDOM: Insulin: 18.4 u[IU]/mL

## 2021-11-12 LAB — MICROALBUMIN / CREATININE URINE RATIO
Creatinine, Urine: 135 mg/dL (ref 20–275)
Microalb Creat Ratio: 16 mcg/mg creat (ref <30)
Microalb, Ur: 2.1 mg/dL

## 2021-11-12 LAB — COMPLETE METABOLIC PANEL WITH GFR
AG Ratio: 1.5 (calc) (ref 1.0–2.5)
ALT: 22 U/L (ref 6–29)
AST: 21 U/L (ref 10–35)
Albumin: 4.3 g/dL (ref 3.6–5.1)
Alkaline phosphatase (APISO): 83 U/L (ref 37–153)
BUN/Creatinine Ratio: 23 (calc) — ABNORMAL HIGH (ref 6–22)
BUN: 24 mg/dL (ref 7–25)
CO2: 26 mmol/L (ref 20–32)
Calcium: 9.2 mg/dL (ref 8.6–10.4)
Chloride: 105 mmol/L (ref 98–110)
Creat: 1.06 mg/dL — ABNORMAL HIGH (ref 0.50–1.05)
Globulin: 2.9 g/dL (calc) (ref 1.9–3.7)
Glucose, Bld: 102 mg/dL — ABNORMAL HIGH (ref 65–99)
Potassium: 4.6 mmol/L (ref 3.5–5.3)
Sodium: 139 mmol/L (ref 135–146)
Total Bilirubin: 0.6 mg/dL (ref 0.2–1.2)
Total Protein: 7.2 g/dL (ref 6.1–8.1)
eGFR: 57 mL/min/{1.73_m2} — ABNORMAL LOW (ref >=60)

## 2021-11-12 LAB — VITAMIN D 25 HYDROXY (VIT D DEFICIENCY, FRACTURES): Vit D, 25-Hydroxy: 59 ng/mL (ref 30–100)

## 2021-11-12 LAB — MAGNESIUM: Magnesium: 2.3 mg/dL (ref 1.5–2.5)

## 2021-11-12 LAB — HEMOGLOBIN A1C
Hgb A1c MFr Bld: 6 % of total Hgb — ABNORMAL HIGH (ref <5.7)
Mean Plasma Glucose: 126 mg/dL
eAG (mmol/L): 7 mmol/L

## 2021-11-12 LAB — LIPID PANEL
Cholesterol: 163 mg/dL (ref <200)
HDL: 46 mg/dL — ABNORMAL LOW (ref >=50)
LDL Cholesterol (Calc): 90 mg/dL (calc)
Non-HDL Cholesterol (Calc): 117 mg/dL (calc) (ref <130)
Total CHOL/HDL Ratio: 3.5 (calc) (ref <5.0)
Triglycerides: 169 mg/dL — ABNORMAL HIGH (ref <150)

## 2021-11-12 LAB — TSH: TSH: 1.77 mIU/L (ref 0.40–4.50)

## 2021-11-12 LAB — MICROSCOPIC MESSAGE

## 2021-11-12 MED ORDER — CEPHALEXIN 500 MG PO CAPS: 500.0000 mg | ORAL_CAPSULE | Freq: Two times a day (BID) | ORAL | 0 refills | Status: AC

## 2021-11-12 NOTE — Progress Notes (Signed)

## 2021-11-12 NOTE — Progress Notes (Signed)
I have spent 5 minutes in review of e-visit questionnaire, review and updating patient chart, medical decision making and response to patient.  ° °Dorell Gatlin W Celeste Candelas, NP ° °  °

## 2022-02-06 ENCOUNTER — Encounter: Payer: Self-pay | Admitting: Nurse Practitioner

## 2022-02-06 ENCOUNTER — Other Ambulatory Visit: Payer: Self-pay | Admitting: Internal Medicine

## 2022-02-06 DIAGNOSIS — Z1231 Encounter for screening mammogram for malignant neoplasm of breast: Secondary | ICD-10-CM

## 2022-02-06 DIAGNOSIS — Z13820 Encounter for screening for osteoporosis: Secondary | ICD-10-CM

## 2022-02-24 DIAGNOSIS — L821 Other seborrheic keratosis: Secondary | ICD-10-CM | POA: Diagnosis not present

## 2022-02-24 DIAGNOSIS — L218 Other seborrheic dermatitis: Secondary | ICD-10-CM | POA: Diagnosis not present

## 2022-02-24 DIAGNOSIS — D225 Melanocytic nevi of trunk: Secondary | ICD-10-CM | POA: Diagnosis not present

## 2022-02-24 DIAGNOSIS — L84 Corns and callosities: Secondary | ICD-10-CM | POA: Diagnosis not present

## 2022-02-24 DIAGNOSIS — L920 Granuloma annulare: Secondary | ICD-10-CM | POA: Diagnosis not present

## 2022-02-24 DIAGNOSIS — L814 Other melanin hyperpigmentation: Secondary | ICD-10-CM | POA: Diagnosis not present

## 2022-03-28 ENCOUNTER — Ambulatory Visit
Admission: RE | Admit: 2022-03-28 | Discharge: 2022-03-28 | Disposition: A | Discharge status: Home / Self Care | Payer: Medicare HMO | Source: Ambulatory Visit | Attending: Internal Medicine | Admitting: Internal Medicine

## 2022-03-28 ENCOUNTER — Ambulatory Visit (INDEPENDENT_AMBULATORY_CARE_PROVIDER_SITE_OTHER): Payer: Medicare HMO | Admitting: Nurse Practitioner

## 2022-03-28 ENCOUNTER — Encounter: Payer: Self-pay | Admitting: Nurse Practitioner

## 2022-03-28 ENCOUNTER — Other Ambulatory Visit: Payer: Self-pay

## 2022-03-28 VITALS — BP 120/60 | HR 71 | Temp 98.1°F | Ht 69.0 in | Wt 225.0 lb

## 2022-03-28 DIAGNOSIS — Z1231 Encounter for screening mammogram for malignant neoplasm of breast: Secondary | ICD-10-CM

## 2022-03-28 DIAGNOSIS — Z1152 Encounter for screening for COVID-19: Secondary | ICD-10-CM | POA: Diagnosis not present

## 2022-03-28 DIAGNOSIS — J40 Bronchitis, not specified as acute or chronic: Secondary | ICD-10-CM

## 2022-03-28 DIAGNOSIS — R6889 Other general symptoms and signs: Secondary | ICD-10-CM

## 2022-03-28 DIAGNOSIS — R0989 Other specified symptoms and signs involving the circulatory and respiratory systems: Secondary | ICD-10-CM | POA: Diagnosis not present

## 2022-03-28 LAB — POCT INFLUENZA A/B
Influenza A, POC: NEGATIVE
Influenza B, POC: NEGATIVE

## 2022-03-28 LAB — POC COVID19 BINAXNOW: SARS Coronavirus 2 Ag: NEGATIVE

## 2022-03-28 MED ORDER — AZITHROMYCIN 250 MG PO TABS: ORAL_TABLET | ORAL | 1 refills | Status: DC

## 2022-03-28 MED ORDER — PREDNISONE 20 MG PO TABS: ORAL_TABLET | ORAL | 0 refills | Status: AC

## 2022-03-28 MED ORDER — PROMETHAZINE-DM 6.25-15 MG/5ML PO SYRP: 5.0000 mL | ORAL_SOLUTION | Freq: Four times a day (QID) | ORAL | 1 refills | Status: DC | PRN

## 2022-03-28 NOTE — Patient Instructions (Signed)

## 2022-03-28 NOTE — Progress Notes (Signed)
Assessment and Plan:  Kristina Hunter was seen today for acute visit.  Diagnoses and all orders for this visit:  Encounter for screening for COVID-19 [Z11.52] Negative  Flu-like symptoms [R68.89] Negative  Bronchitis Push fluids Tylenol and Mucinex as needed If no improvement in next week notify the office Begin Z-pak and Prednisone taper and use Promethazine DM as needed -     azithromycin (ZITHROMAX) 250 MG tablet; Take 2 tablets (500 mg) on  Day 1,  followed by 1 tablet (250 mg) once daily on Days 2 through 5. -     predniSONE (DELTASONE) 20 MG tablet; 3 tablets daily with food for 3 days, 2 tabs daily for 3 days, 1 tab a day for 5 days. -     promethazine-dextromethorphan (PROMETHAZINE-DM) 6.25-15 MG/5ML syrup; Take 5 mLs by mouth 4 (four) times daily as needed for cough.  Labile hypertension - continue DASH diet, exercise and monitor at home. Call if greater than 130/80.  - Currently controlled without medication       Further disposition pending results of labs. Discussed med's effects and SE's.   Over 30 minutes of exam, counseling, chart review, and critical decision making was performed.   Future Appointments  Date Time Provider [place]  03/28/2022  3:45 PM Alycia Rossetti, NP [facility] None  05/19/2022 11:00 AM Alycia Rossetti, NP [facility] None  07/11/2022  9:30 AM [facility] DX DEXA 1 [facility]  11/14/2022  9:00 AM [place], Kenney Houseman, NP [facility] None    ------------------------------------------------------------------------------------------------------------------   HPI BP 120/60   Pulse 71   Temp 98.1 F (36.7 C)   Ht 5\' 9"  (1.753 m)   Wt 225 lb (102.1 kg)   SpO2 97%   BMI 33.23 kg/m    66 y.o.female presents for runny nose of green mucus, productive cough of green mucus and headache x 2 weeks. Covid and flu are negative today. Denies nausea, vomiting, diarrhea, fevers and body aches.  Has tried Mucinex, Mucinex D and cold tablet with no  relief  BP is currently well controlled without medication.  Denies chest pain, shortness of breath and dizziness.  BP Readings from Last 3 Encounters:  03/28/22 120/60  03/28/22 120/60  11/11/21 118/72    BMI is Body mass index is 33.23 kg/m., she has been working on diet and exercise. Wt Readings from Last 3 Encounters:  03/28/22 225 lb (102.1 kg)  03/28/22 225 lb (102.1 kg)  11/11/21 226 lb 6.4 oz (102.7 kg)    Past Medical History:  Diagnosis Date   Allergy    Cervical disc disorder    s/p fusion    Hyperlipidemia      No Known Allergies  Current Outpatient Medications on File Prior to Visit  Medication Sig   BABY ASPIRIN PO Take 81 mg by mouth daily.   Calcium-Vitamin D (CALTRATE 600 PLUS-VIT D PO) Take by mouth daily.   CHOLECALCIFEROL PO Take 2,000 Units by mouth daily.   Cyanocobalamin (B-12) 1000 MCG TABS Take 1,000 mcg by mouth.   desloratadine (CLARINEX) 5 MG tablet TAKE 1 TABLET DAILY FOR    ALLERGIES   famotidine (PEPCID) 10 MG tablet Take 10 mg by mouth daily.   Multiple Vitamins-Minerals (MULTIVITAMIN PO) Take by mouth daily.   Omega-3 Fatty Acids (FISH OIL PO) Take by mouth daily.   rosuvastatin (CRESTOR) 10 MG tablet TAKE 1 TABLET DAILY FOR CHOLESTEROL   vitamin C (ASCORBIC ACID) 500 MG tablet Take 500 mg by mouth daily.  Zinc 25 MG TABS Take 1 tablet by mouth daily.   nystatin (MYCOSTATIN) 100000 UNIT/ML suspension 5 ml four times a day, retain in mouth as long as possible (Swish and Spit).  Use for 48 hours after symptoms resolve. (Patient not taking: Reported on 03/28/2022)   No current facility-administered medications on file prior to visit.    ROS: all negative except above.   Physical Exam:  BP 120/60   Pulse 71   Temp 98.1 F (36.7 C)   Ht 5\' 9"  (1.753 m)   Wt 225 lb (102.1 kg)   SpO2 97%   BMI 33.23 kg/m   General Appearance: Well nourished, in no apparent distress. Eyes: PERRLA, EOMs, conjunctiva no swelling or erythema Sinuses:  No Frontal/maxillary tenderness ENT/Mouth: Ext aud canals clear, TMs without dull with fluid bilaterally. No erythema, swelling, or exudate on post pharynx.  Hearing normal.  Neck: Supple, thyroid normal.  Respiratory: Respiratory effort normal, BS equal bilaterally without rales, rhonchi, wheezing or stridor. Wet cough throughout visit Cardio: RRR with no MRGs. Brisk peripheral pulses without edema.  Abdomen: Soft, + BS.  Non tender, no guarding, rebound, hernias, masses. Lymphatics: positive bilateral submandibular tender adenopathy Musculoskeletal: Full ROM, 5/5 strength, normal gait.  Skin: Warm, dry without rashes, lesions, ecchymosis.  Neuro: Cranial nerves intact. Normal muscle tone, no cerebellar symptoms. Sensation intact.  Psych: Awake and oriented X 3, normal affect, Insight and Judgment appropriate.     Alycia Rossetti, NP 3:11 PM [facility]

## 2022-05-09 NOTE — Progress Notes (Signed)
NO SHOW

## 2022-05-18 NOTE — Progress Notes (Signed)
MEDICARE ANNUAL WELLNESS VISIT AND FOLLOW UP  Assessment:   Kristina Hunter was seen today for medicare wellness and follow-up.  Diagnoses and all orders for this visit:  Encounter for annual wellness exam in Medicare patient       - Due annually       - Mammogram 03/28/22- negative, Dexa 01/31/19 T score -0.5 normal- next DEXA scheduled for 06/2022 or 07/2022       - Cologuard negative 03/04/20  CKD stage 3a(HCC) Increase fluids, avoid NSAIDS, monitor sugars, will monitor - CMP  Labile hypertension -     CBC with Differential/Platelet -  continue medications, DASH diet, exercise and monitor at home. Call if greater than 130/80.    Restless leg syndrome Intermittent, does not wish to pursue medication  Continue to monitor symptoms  Hyperlipidemia, mixed -     COMPLETE METABOLIC PANEL WITH GFR -     Lipid panel - Continue medications - Continue diet and exercise.   Abnormal glucose Continue diet and exercise -     COMPLETE METABOLIC PANEL WITH GFR -     Hemoglobin A1c  Vitamin D deficiency       - Continue Vit D supplementation  Medication management -     CBC with Differential/Platelet -     COMPLETE METABOLIC PANEL WITH GFR -     Magnesium   Gastroesophageal reflux disease, unspecified whether esophagitis present       - Continue Pepcid and dietary modifications  [place]  Obesity (BMI 30.0-34.9) Long discussion about weight loss, diet, and exercise Recommended diet heavy in fruits and veggies and low in animal meats, cheeses, and dairy products, appropriate calorie intake Patient will work on decreasing saturated fats and simple carbs Increase lean proteins and exercise Follow up at next visit  Trigger finger of ring finger left hand Does not currently want intervention Continue to monitor    Over 40 minutes of exam, counseling, chart review and critical decision making was performed Future Appointments  Date Time Provider Department Center  07/11/2022  9:30 AM  [facility] DX DEXA 1 [facility]  11/14/2022  9:00 AM Adela Glimpse, NP [facility] None  05/21/2023 11:00 AM Raynelle Dick, NP [facility] None     Plan:   During the course of the visit the patient was educated and counseled about appropriate screening and preventive services including:   Pneumococcal vaccine  Prevnar 13 Influenza vaccine Td vaccine Screening electrocardiogram Bone densitometry screening Colorectal cancer screening Diabetes screening Glaucoma screening Nutrition counseling  Advanced directives: requested   Subjective:  Kristina Hunter is a 67 y.o. female who presents for Medicare Annual Wellness Visit and 3 month follow up. has Hyperlipidemia; Environmental allergies; Other abnormal glucose; Obesity (BMI 30.0-34.9); Labile hypertension; OAB (overactive bladder); Vitamin D deficiency; Acid reflux; Hepatic steatosis; and Persistent proteinuria on their problem list.    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 Has 3 kids, 5 grandsons and 3 grand daughters. They have been traveling with their camper.   She does take famotidine daily 10 mg for reflux symptoms, has improved persistent cough.  She is trying to decrease food triggers. She is sleeping up on pillows  She had some tingling in her feet but is avery rare occurrence and usually only occurs with restless legs. States it is still infrequent enough that she does not wish to have medication.   Also having some trigger finger symptoms of 4th left finger.  Can currently  get it to move without difficulty  She was on myrbetriq for OAB/incontinence but didn't feel this helped. Previously on vesicare which worked better but cost prohibitive. Feels she is doing fairly without agent at this time. She wears a panty liner, minimal leakage.   BMI is Body mass index is 33.29 kg/m., she has been working on diet and exercise. She is golfing 4 hours 3 days a week since not going to  the gym . She is trying to get about 48 ounces of water a day Wt Readings from Last 3 Encounters:  05/19/22 225 lb 6.4 oz (102.2 kg)  03/28/22 225 lb (102.1 kg)  03/28/22 225 lb (102.1 kg)   Her blood pressure has been controlled at home, today their BP is BP: 122/70  BP Readings from Last 3 Encounters:  05/19/22 122/70  03/28/22 120/60  03/28/22 120/60  She does workout. She denies chest pain, shortness of breath, dizziness.   She is on cholesterol medication (rosuvastatin 10 mg daily) and denies myalgias. Her cholesterol is at goal.She is limiting red meat, saturated fats and simple carbs.  The cholesterol last visit was:   Lab Results  Component Value Date   CHOL 163 11/11/2021   HDL 46 (L) 11/11/2021   LDLCALC 90 11/11/2021   TRIG 169 (H) 11/11/2021   CHOLHDL 3.5 11/11/2021   She has had prediabetes predating 2015. She has been working on diet and exercise for prediabetes, and denies increased appetite, nausea, paresthesia of the feet, polydipsia, polyuria, visual disturbances, vomiting and weight loss. Last A1C in the office was:  Lab Results  Component Value Date   HGBA1C 6.0 (H) 11/11/2021   Reports 64+ fluid ounces minimum daily. Last GFR: Lab Results  Component Value Date   EGFR 57 (L) 11/11/2021    Patient is on Vitamin D supplement.   Lab Results  Component Value Date   VD25OH 59 11/11/2021      Medication Review: Current Outpatient Medications on File Prior to Visit  Medication Sig Dispense Refill   BABY ASPIRIN PO Take 81 mg by mouth daily.     Calcium-Vitamin D (CALTRATE 600 PLUS-VIT D PO) Take by mouth daily.     CHOLECALCIFEROL PO Take 2,000 Units by mouth daily.     Cyanocobalamin (B-12) 1000 MCG TABS Take 1,000 mcg by mouth.     desloratadine (CLARINEX) 5 MG tablet TAKE 1 TABLET DAILY FOR    ALLERGIES 90 tablet 3   famotidine (PEPCID) 10 MG tablet Take 10 mg by mouth daily.     Multiple Vitamins-Minerals (MULTIVITAMIN PO) Take by mouth daily.      Omega-3 Fatty Acids (FISH OIL PO) Take by mouth daily.     promethazine-dextromethorphan (PROMETHAZINE-DM) 6.25-15 MG/5ML syrup Take 5 mLs by mouth 4 (four) times daily as needed for cough. 240 mL 1   rosuvastatin (CRESTOR) 10 MG tablet TAKE 1 TABLET DAILY FOR CHOLESTEROL 90 tablet 3   vitamin C (ASCORBIC ACID) 500 MG tablet Take 500 mg by mouth daily.     Zinc 25 MG TABS Take 1 tablet by mouth daily.     azithromycin (ZITHROMAX) 250 MG tablet Take 2 tablets (500 mg) on  Day 1,  followed by 1 tablet (250 mg) once daily on Days 2 through 5. 6 each 1   No current facility-administered medications on file prior to visit.    No Known Allergies  Current Problems (verified) Patient Active Problem List   Diagnosis Date Noted   Persistent proteinuria  11/12/2020   Hepatic steatosis 05/13/2019   Acid reflux 05/06/2019   Vitamin D deficiency 05/14/2018   OAB (overactive bladder) 11/06/2017   Other abnormal glucose 10/20/2014   Obesity (BMI 30.0-34.9) 10/20/2014   Labile hypertension 10/20/2014   Hyperlipidemia    Environmental allergies     Screening Tests Immunization History  Administered Date(s) Administered   Influenza Inj Mdck Quad With Preservative 10/24/2016, 11/07/2017   Influenza Split 10/20/2014   Influenza, High Dose Seasonal PF 11/11/2018, 11/11/2019, 11/11/2020, 11/11/2021   Influenza, Seasonal, Injecte, Preservative Fre 10/20/2015   Pneumococcal Polysaccharide-23 08/02/2020   Tdap 07/13/2009, 08/02/2020   Zoster Recombinat (Shingrix) 02/24/2021   Health Maintenance  Topic Date Due   COVID-19 Vaccine (1) 06/04/2022 (Originally 10/09/1960)   Pneumonia Vaccine 60+ Years old (2 of 2 - PCV) 05/19/2023 (Originally 08/02/2021)   INFLUENZA VACCINE  08/03/2022   Fecal DNA (Cologuard)  03/05/2023   Medicare Annual Wellness (AWV)  05/19/2023   MAMMOGRAM  03/27/2024   DTaP/Tdap/Td (3 - Td or Tdap) 08/03/2030   DEXA SCAN  Completed   Hepatitis C Screening  Completed   Zoster  Vaccines- Shingrix  Completed   HPV VACCINES  Aged Out       Names of Other Physician/Practitioners you currently use: 1. [facility] here for primary care 2. Dr. Jimmye Norman, eye doctor, last visit 2023 3.[facility], dentist, last visit 01/2022 4. Dr. Terri Piedra, derm, last visit 2021, goes annually   Patient Care Team: Lucky Cowboy, MD as PCP - General (Internal Medicine) Teryl Lucy, MD as Consulting Physician (Orthopedic Surgery) Cherlyn Roberts, MD as Consulting Physician (Dermatology) [name], Willaim Rayas, MD as Consulting Physician (Gastroenterology)  SURGICAL HISTORY She  has a past surgical history that includes Tubal ligation (Bilateral, 1986); Abdominal hysterectomy (1990); and Cervical disc surgery (2000). FAMILY HISTORY Her family history includes Arthritis in her father and mother; Dementia in her mother; Diabetes in her father; Heart attack in her paternal grandfather; Hyperlipidemia in her father and mother; Hypertension in her father and mother. SOCIAL HISTORY She  reports that she has never smoked. She has never used smokeless tobacco. She reports that she does not drink alcohol and does not use drugs.   MEDICARE WELLNESS OBJECTIVES: Physical activity: Current Exercise Habits: Home exercise routine, Type of exercise: walking (golfing several times a week), Time (Minutes): 60, Frequency (Times/Week): 3, Weekly Exercise (Minutes/Week): 180, Intensity: Mild, Exercise limited by: orthopedic condition(s) Cardiac risk factors: Cardiac Risk Factors include: advanced age (>56men, >3 women);dyslipidemia;obesity (BMI >30kg/m2);family history of premature cardiovascular disease Depression/mood screen:      05/19/2022   11:05 AM  Depression screen PHQ 2/9  Decreased Interest 0  Down, Depressed, Hopeless 0  PHQ - 2 Score 0    ADLs:     05/19/2022   11:05 AM  In your present state of health, do you have any difficulty  performing the following activities:  Hearing? 0  Vision? 0  Difficulty concentrating or making decisions? 0  Walking or climbing stairs? 0  Dressing or bathing? 0  Doing errands, shopping? 0      Cognitive Testing  Alert? Yes  Normal Appearance?Yes  Oriented to person? Yes  Place? Yes   Time? Yes  Recall of three objects?  Yes  Can perform simple calculations? Yes  Displays appropriate judgment?Yes  Can read the correct time from a watch face?Yes  EOL planning: Does Patient Have a Medical Advance Directive?: No Would patient like information on creating a  medical advance directive?: No - Patient declined  Review of Systems  Constitutional:  Negative for chills, fever, malaise/fatigue and weight loss.  HENT:  Negative for congestion, hearing loss and tinnitus.   Eyes:  Negative for blurred vision and double vision.  Respiratory:  Negative for cough, sputum production, shortness of breath and wheezing.   Cardiovascular:  Negative for chest pain, palpitations, orthopnea, claudication, leg swelling and PND.  Gastrointestinal:  Positive for heartburn. Negative for abdominal pain, blood in stool, constipation, diarrhea, melena, nausea and vomiting.  Genitourinary: Negative.   Musculoskeletal:  Positive for back pain (stands long periods pain in lower back) and joint pain (left ring finger trigger finger). Negative for falls and myalgias.  Skin:  Negative for rash.  Neurological:  Positive for tingling (occ right hand, has been since cervical fusion, occasional tingling of feet but infreauent and only on nights with restless leg). Negative for dizziness, tremors, sensory change, loss of consciousness, weakness and headaches.  Endo/Heme/Allergies:  Positive for environmental allergies. Negative for polydipsia.  Psychiatric/Behavioral: Negative.  Negative for depression, memory loss, substance abuse and suicidal ideas. The patient is not nervous/anxious and does not have insomnia.   All  other systems reviewed and are negative.    Objective:     Today's Vitals   05/19/22 1057  BP: 122/70  Pulse: 71  Temp: 97.9 F (36.6 C)  SpO2: 98%  Weight: 225 lb 6.4 oz (102.2 kg)  Height: 5\' 9"  (1.753 m)    Body mass index is 33.29 kg/m.  General appearance: alert, no distress, WD/WN, female HEENT: normocephalic, sclerae anicteric, TMs pearly, nares patent, no discharge or erythema, pharynx normal Oral cavity: MMM, no lesions Neck: supple, no lymphadenopathy, no thyromegaly, no masses Heart: RRR, normal S1, S2, no murmurs Lungs: CTA bilaterally, no wheezes, rhonchi, or rales Skin:Small 1-2 cm cut on scalp , no signs of infection Abdomen: +bs, soft, non tender, non distended, no masses, no hepatomegaly, no splenomegaly Musculoskeletal: nontender, no swelling, no obvious deformity Extremities: no edema, no cyanosis, no clubbing Pulses: 2+ symmetric, upper and lower extremities, normal cap refill Neurological: alert, oriented x 3, CN2-12 intact, strength normal upper extremities and lower extremities, sensation normal throughout, DTRs 2+ throughout, no cerebellar signs, gait normal Psychiatric: normal affect, behavior normal, pleasant   Medicare Attestation I have personally reviewed: The patient's medical and social history Their use of alcohol, tobacco or illicit drugs Their current medications and supplements The patient's functional ability including ADLs,fall risks, home safety risks, cognitive, and hearing and visual impairment Diet and physical activities Evidence for depression or mood disorders  The patient's weight, height, BMI, and visual acuity have been recorded in the chart.  I have made referrals, counseling, and provided education to the patient based on review of the above and I have provided the patient with a written personalized care plan for preventive services.     Revonda Humphrey ANP-C  [facility]  05/19/2022

## 2022-05-19 ENCOUNTER — Encounter: Payer: Self-pay | Admitting: Nurse Practitioner

## 2022-05-19 ENCOUNTER — Ambulatory Visit (INDEPENDENT_AMBULATORY_CARE_PROVIDER_SITE_OTHER): Payer: Medicare HMO | Admitting: Nurse Practitioner

## 2022-05-19 VITALS — BP 122/70 | HR 71 | Temp 97.9°F | Ht 69.0 in | Wt 225.4 lb

## 2022-05-19 DIAGNOSIS — Z0000 Encounter for general adult medical examination without abnormal findings: Secondary | ICD-10-CM

## 2022-05-19 DIAGNOSIS — M65342 Trigger finger, left ring finger: Secondary | ICD-10-CM

## 2022-05-19 DIAGNOSIS — E669 Obesity, unspecified: Secondary | ICD-10-CM | POA: Diagnosis not present

## 2022-05-19 DIAGNOSIS — Z79899 Other long term (current) drug therapy: Secondary | ICD-10-CM | POA: Diagnosis not present

## 2022-05-19 DIAGNOSIS — N1831 Chronic kidney disease, stage 3a: Secondary | ICD-10-CM | POA: Diagnosis not present

## 2022-05-19 DIAGNOSIS — Z0001 Encounter for general adult medical examination with abnormal findings: Secondary | ICD-10-CM

## 2022-05-19 DIAGNOSIS — R7309 Other abnormal glucose: Secondary | ICD-10-CM

## 2022-05-19 DIAGNOSIS — G2581 Restless legs syndrome: Secondary | ICD-10-CM

## 2022-05-19 DIAGNOSIS — K219 Gastro-esophageal reflux disease without esophagitis: Secondary | ICD-10-CM

## 2022-05-19 DIAGNOSIS — R6889 Other general symptoms and signs: Secondary | ICD-10-CM

## 2022-05-19 DIAGNOSIS — E559 Vitamin D deficiency, unspecified: Secondary | ICD-10-CM

## 2022-05-19 DIAGNOSIS — R0989 Other specified symptoms and signs involving the circulatory and respiratory systems: Secondary | ICD-10-CM | POA: Diagnosis not present

## 2022-05-19 DIAGNOSIS — E785 Hyperlipidemia, unspecified: Secondary | ICD-10-CM | POA: Diagnosis not present

## 2022-05-19 LAB — CBC WITH DIFFERENTIAL/PLATELET
Eosinophils Absolute: 123 cells/uL (ref 15–500)
MCV: 94.5 fL (ref 80.0–100.0)
Monocytes Relative: 9.8 %
Neutrophils Relative %: 53.6 %
Total Lymphocyte: 33.1 %
WBC: 4.9 10*3/uL (ref 3.8–10.8)

## 2022-05-19 NOTE — Patient Instructions (Signed)

## 2022-05-20 LAB — CBC WITH DIFFERENTIAL/PLATELET
Absolute Monocytes: 480 cells/uL (ref 200–950)
Basophils Absolute: 49 cells/uL (ref 0–200)
Basophils Relative: 1 %
Eosinophils Relative: 2.5 %
HCT: 43.2 % (ref 35.0–45.0)
Hemoglobin: 14.4 g/dL (ref 11.7–15.5)
Lymphs Abs: 1622 cells/uL (ref 850–3900)
MCH: 31.5 pg (ref 27.0–33.0)
MCHC: 33.3 g/dL (ref 32.0–36.0)
MPV: 10.5 fL (ref 7.5–12.5)
Neutro Abs: 2626 cells/uL (ref 1500–7800)
Platelets: 235 10*3/uL (ref 140–400)
RBC: 4.57 10*6/uL (ref 3.80–5.10)
RDW: 12.1 % (ref 11.0–15.0)

## 2022-05-20 LAB — COMPLETE METABOLIC PANEL WITH GFR
AG Ratio: 1.7 (calc) (ref 1.0–2.5)
ALT: 29 U/L (ref 6–29)
AST: 27 U/L (ref 10–35)
Albumin: 4.3 g/dL (ref 3.6–5.1)
Alkaline phosphatase (APISO): 72 U/L (ref 37–153)
BUN: 24 mg/dL (ref 7–25)
CO2: 27 mmol/L (ref 20–32)
Calcium: 9 mg/dL (ref 8.6–10.4)
Chloride: 104 mmol/L (ref 98–110)
Creat: 0.97 mg/dL (ref 0.50–1.05)
Globulin: 2.5 g/dL (calc) (ref 1.9–3.7)
Glucose, Bld: 97 mg/dL (ref 65–99)
Potassium: 4.7 mmol/L (ref 3.5–5.3)
Sodium: 140 mmol/L (ref 135–146)
Total Bilirubin: 0.5 mg/dL (ref 0.2–1.2)
Total Protein: 6.8 g/dL (ref 6.1–8.1)
eGFR: 64 mL/min/{1.73_m2} (ref >=60)

## 2022-05-20 LAB — LIPID PANEL
Cholesterol: 155 mg/dL (ref <200)
HDL: 47 mg/dL — ABNORMAL LOW (ref >=50)
LDL Cholesterol (Calc): 82 mg/dL (calc)
Non-HDL Cholesterol (Calc): 108 mg/dL (calc) (ref <130)
Total CHOL/HDL Ratio: 3.3 (calc) (ref <5.0)
Triglycerides: 161 mg/dL — ABNORMAL HIGH (ref <150)

## 2022-05-20 LAB — HEMOGLOBIN A1C
Hgb A1c MFr Bld: 6.1 % of total Hgb — ABNORMAL HIGH (ref <5.7)
Mean Plasma Glucose: 128 mg/dL
eAG (mmol/L): 7.1 mmol/L

## 2022-05-20 LAB — MAGNESIUM: Magnesium: 2.2 mg/dL (ref 1.5–2.5)

## 2022-05-21 IMAGING — MG MM DIGITAL SCREENING BILAT W/ TOMO AND CAD
8 series · 8 of 24 positions shown · non-contrast
Comparison: Previous exam(s).

CLINICAL DATA: Screening.

EXAM:
DIGITAL SCREENING BILATERAL MAMMOGRAM WITH TOMOSYNTHESIS AND CAD
TECHNIQUE: Bilateral screening digital craniocaudal and mediolateral oblique
mammograms were obtained. Bilateral screening digital breast
tomosynthesis was performed. The images were evaluated with
computer-aided detection.

[R CC synth-2D]
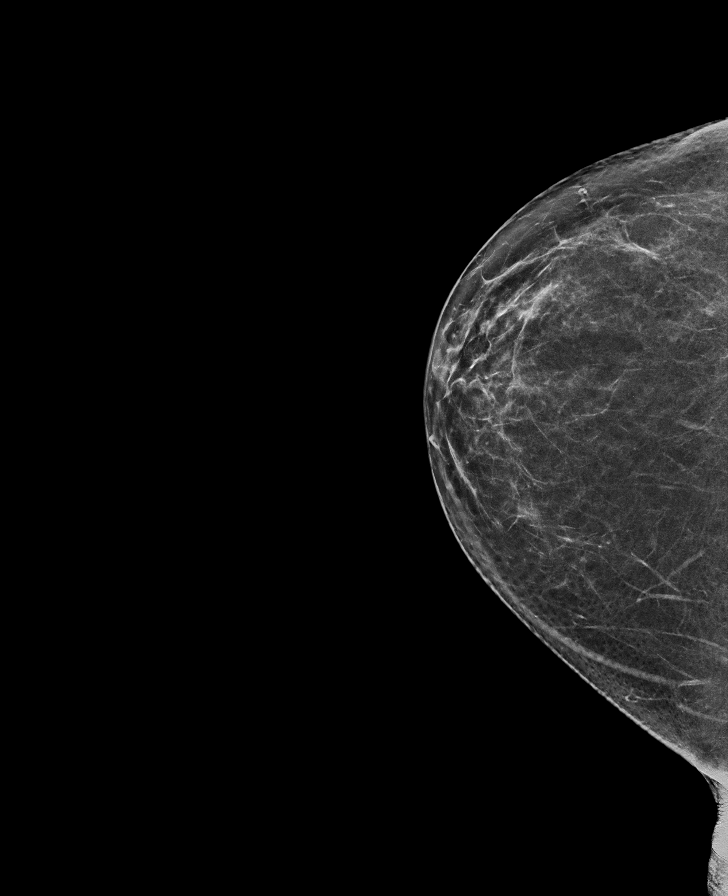

[L MLO synth-2D]
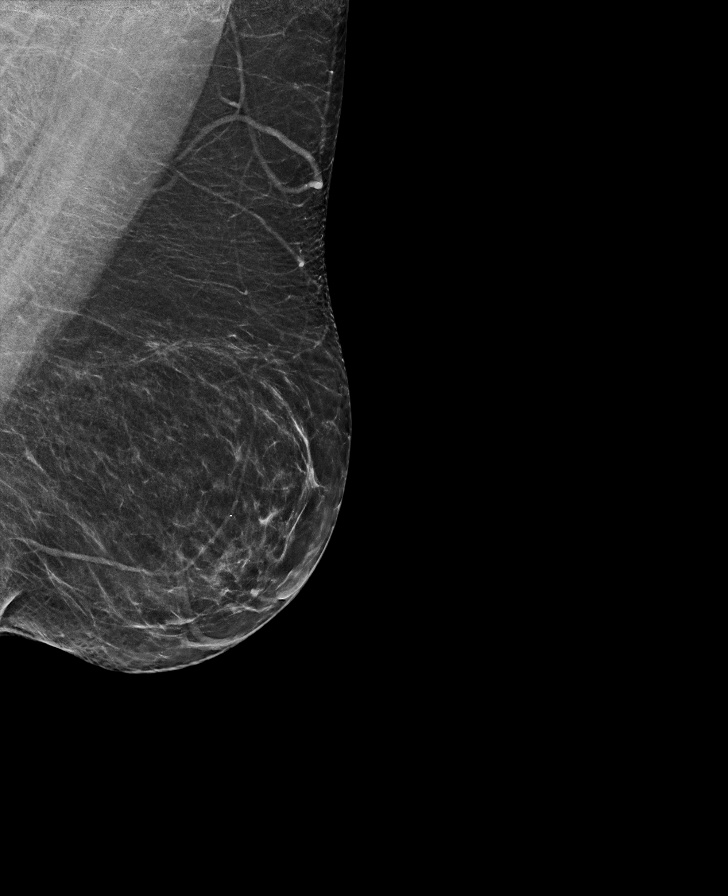

[L CC synth-2D]
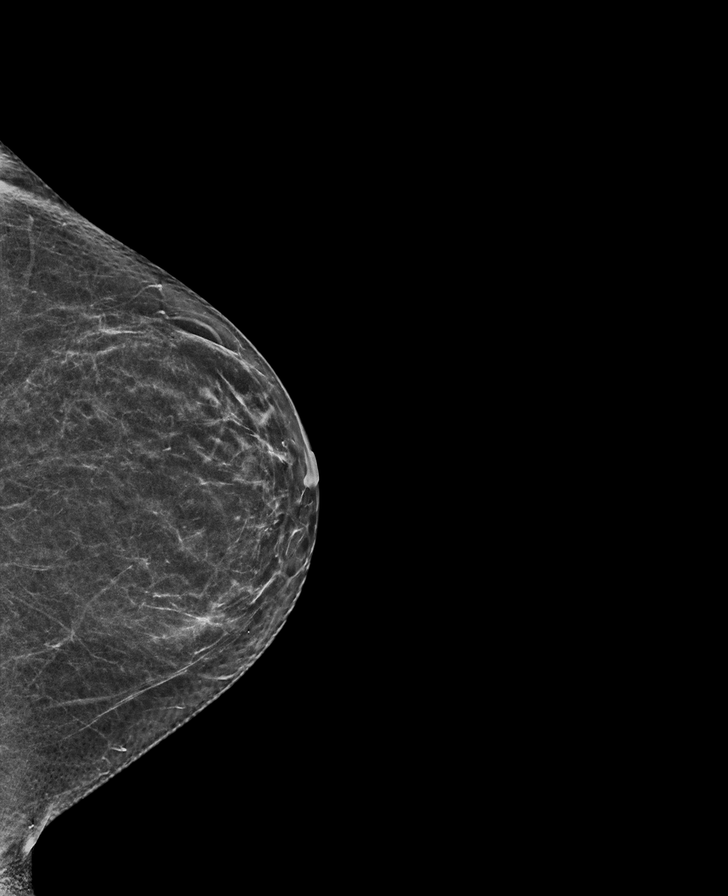

[R MLO synth-2D]
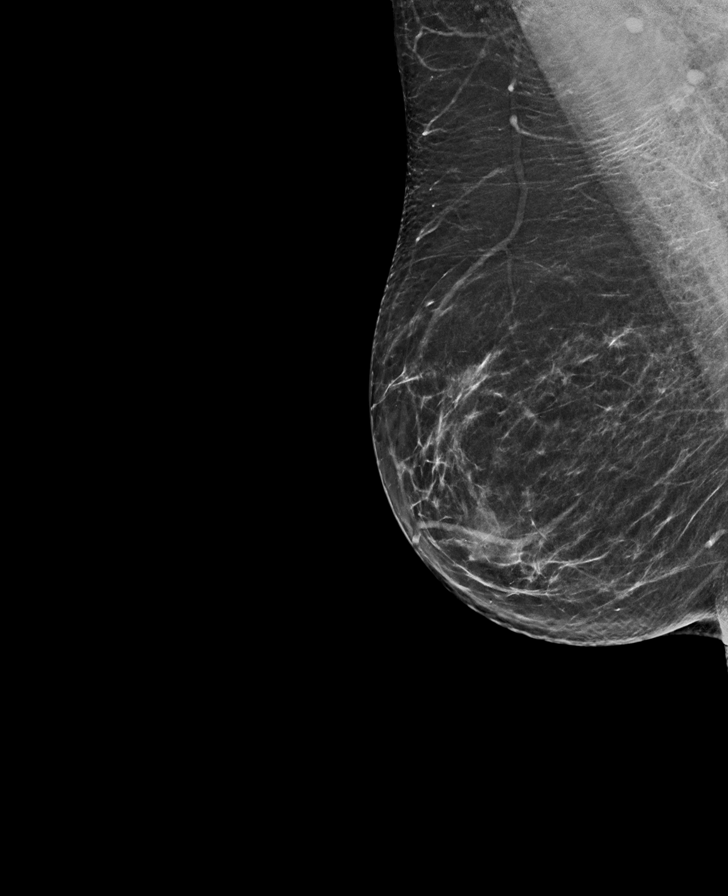

[L MLO tomo · tomo slice 35/68.0]
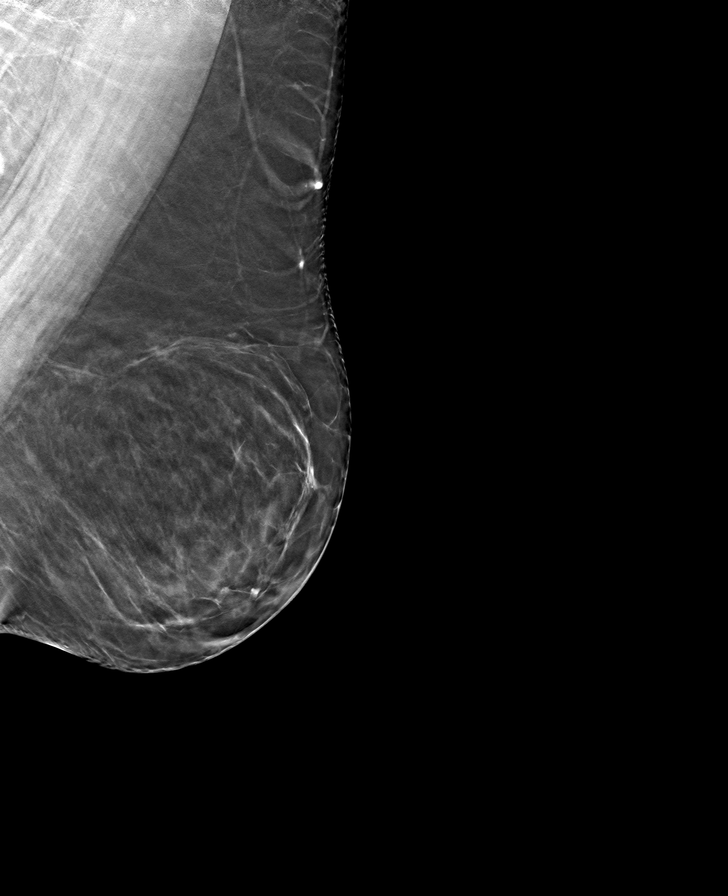

[R CC tomo · tomo slice 35/68.0]
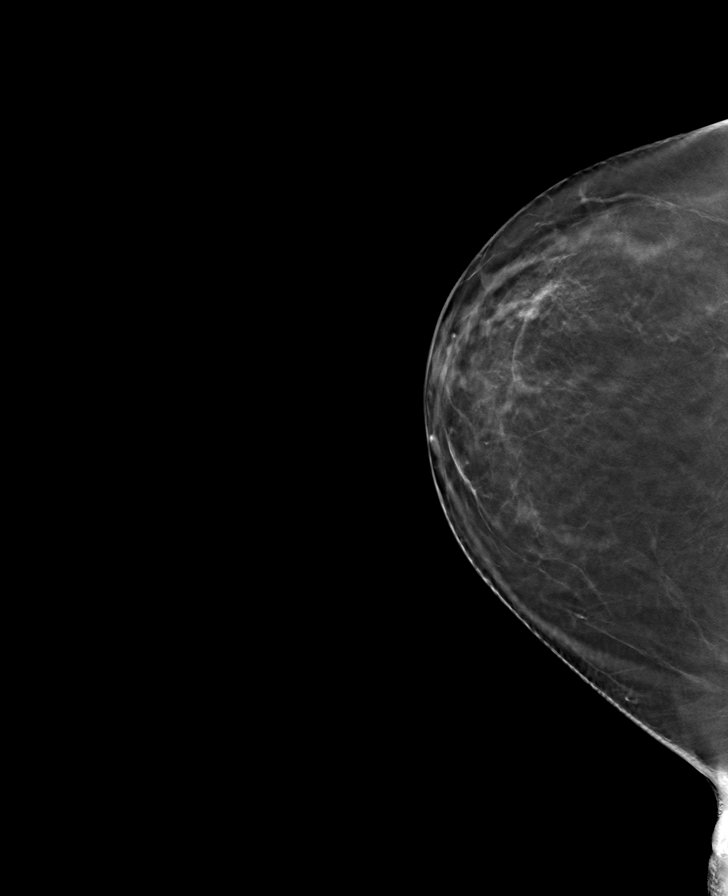

[R MLO tomo · tomo slice 35/70.0]
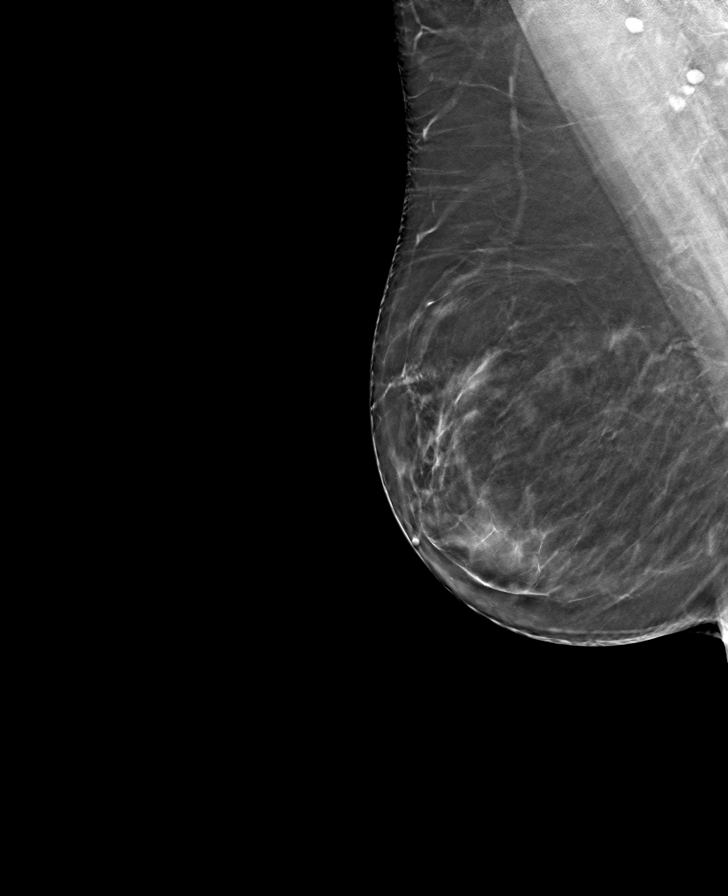

[L CC tomo · tomo slice 33/64.0]
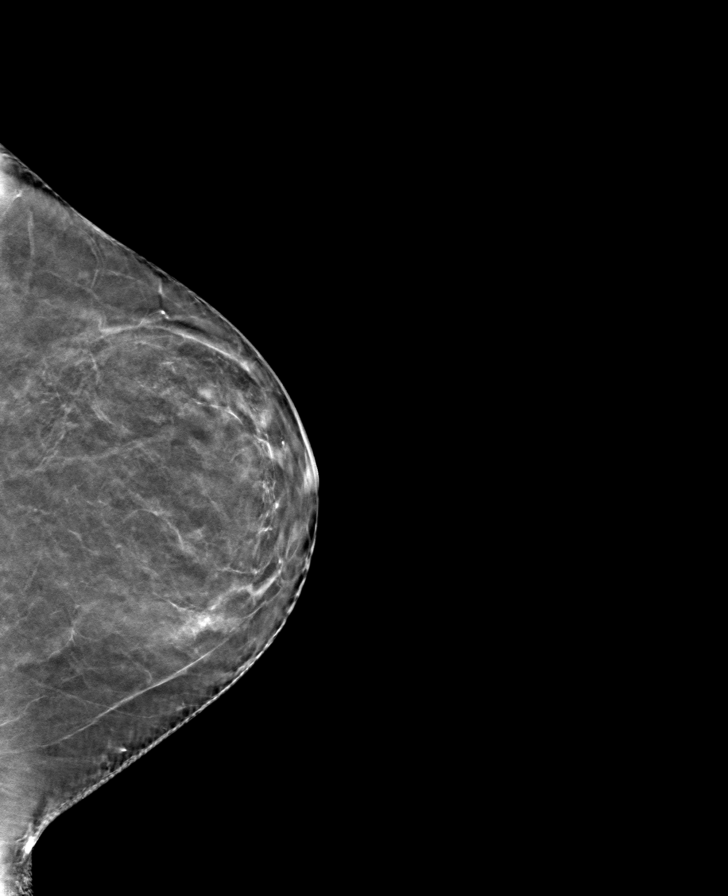

[8 of 24 positions shown; findings below may reference images not displayed]

ACR Breast Density Category b: There are scattered areas of
fibroglandular density.
FINDINGS: There are no findings suspicious for malignancy. The images were
evaluated with computer-aided detection.
IMPRESSION: No mammographic evidence of malignancy. A result letter of this
screening mammogram will be mailed directly to the patient.

RECOMMENDATION:
Screening mammogram in one year. (Code:[id number])

BI-RADS CATEGORY  1: Negative.

## 2022-07-11 ENCOUNTER — Ambulatory Visit
Admission: RE | Admit: 2022-07-11 | Discharge: 2022-07-11 | Disposition: A | Discharge status: Home / Self Care | Payer: Medicare HMO | Source: Ambulatory Visit | Attending: Nurse Practitioner | Admitting: Nurse Practitioner

## 2022-07-11 DIAGNOSIS — N958 Other specified menopausal and perimenopausal disorders: Secondary | ICD-10-CM | POA: Diagnosis not present

## 2022-07-11 DIAGNOSIS — E349 Endocrine disorder, unspecified: Secondary | ICD-10-CM | POA: Diagnosis not present

## 2022-07-11 DIAGNOSIS — Z13820 Encounter for screening for osteoporosis: Secondary | ICD-10-CM

## 2022-09-21 ENCOUNTER — Telehealth: Payer: Self-pay | Admitting: Nurse Practitioner

## 2022-09-21 ENCOUNTER — Other Ambulatory Visit: Payer: Self-pay | Admitting: Nurse Practitioner

## 2022-09-21 DIAGNOSIS — E782 Mixed hyperlipidemia: Secondary | ICD-10-CM

## 2022-09-21 MED ORDER — ROSUVASTATIN CALCIUM 10 MG PO TABS
ORAL_TABLET | ORAL | 3 refills | Status: DC
Start: 2022-09-21 — End: 2023-09-04

## 2022-09-21 NOTE — Telephone Encounter (Signed)
Patient is requesting a refill on Rosuvastatin to [facility].

## 2022-10-23 DIAGNOSIS — H5203 Hypermetropia, bilateral: Secondary | ICD-10-CM | POA: Diagnosis not present

## 2022-10-23 DIAGNOSIS — Z0100 Encounter for examination of eyes and vision without abnormal findings: Secondary | ICD-10-CM | POA: Diagnosis not present

## 2022-11-14 ENCOUNTER — Encounter: Payer: Self-pay | Admitting: Nurse Practitioner

## 2022-11-14 ENCOUNTER — Ambulatory Visit (INDEPENDENT_AMBULATORY_CARE_PROVIDER_SITE_OTHER): Payer: Medicare HMO | Admitting: Nurse Practitioner

## 2022-11-14 VITALS — BP 130/84 | HR 84 | Temp 98.0°F | Resp 16 | Ht 69.0 in | Wt 224.4 lb

## 2022-11-14 DIAGNOSIS — Z23 Encounter for immunization: Secondary | ICD-10-CM | POA: Diagnosis not present

## 2022-11-14 DIAGNOSIS — E559 Vitamin D deficiency, unspecified: Secondary | ICD-10-CM

## 2022-11-14 DIAGNOSIS — Z0001 Encounter for general adult medical examination with abnormal findings: Secondary | ICD-10-CM

## 2022-11-14 DIAGNOSIS — N1831 Chronic kidney disease, stage 3a: Secondary | ICD-10-CM | POA: Diagnosis not present

## 2022-11-14 DIAGNOSIS — G2581 Restless legs syndrome: Secondary | ICD-10-CM

## 2022-11-14 DIAGNOSIS — K219 Gastro-esophageal reflux disease without esophagitis: Secondary | ICD-10-CM | POA: Diagnosis not present

## 2022-11-14 DIAGNOSIS — R0989 Other specified symptoms and signs involving the circulatory and respiratory systems: Secondary | ICD-10-CM

## 2022-11-14 DIAGNOSIS — R7309 Other abnormal glucose: Secondary | ICD-10-CM | POA: Diagnosis not present

## 2022-11-14 DIAGNOSIS — Z79899 Other long term (current) drug therapy: Secondary | ICD-10-CM

## 2022-11-14 DIAGNOSIS — Z1389 Encounter for screening for other disorder: Secondary | ICD-10-CM | POA: Diagnosis not present

## 2022-11-14 DIAGNOSIS — Z0000 Encounter for general adult medical examination without abnormal findings: Secondary | ICD-10-CM

## 2022-11-14 DIAGNOSIS — Z136 Encounter for screening for cardiovascular disorders: Secondary | ICD-10-CM | POA: Diagnosis not present

## 2022-11-14 DIAGNOSIS — L84 Corns and callosities: Secondary | ICD-10-CM

## 2022-11-14 DIAGNOSIS — E782 Mixed hyperlipidemia: Secondary | ICD-10-CM

## 2022-11-14 DIAGNOSIS — E66811 Obesity, class 1: Secondary | ICD-10-CM

## 2022-11-14 DIAGNOSIS — M65342 Trigger finger, left ring finger: Secondary | ICD-10-CM

## 2022-11-14 NOTE — Patient Instructions (Signed)
Corns and Calluses Corns are small areas of thickened skin that form on the top, sides, or tip of a toe. Corns have a cone-shaped core with a point that can press on a nerve below. This causes pain. Calluses are areas of thickened skin that can form anywhere on the body, including the hands, fingers, palms, soles of the feet, and heels. Calluses are usually larger than corns. What are the causes? Corns and calluses are caused by rubbing (friction) or pressure, such as from shoes that are too tight or do not fit properly. What increases the risk? Corns are more likely to develop in people who have misshapen toes (toe deformities), such as hammer toes. Calluses can form with friction to any area of the skin. They are more likely to develop in people who: Work with their hands. Wear shoes that fit poorly, are too tight, or are high-heeled. Have toe deformities. What are the signs or symptoms? Symptoms of a corn or callus include: A hard growth on the skin. Pain or tenderness under the skin. Redness and swelling. Increased discomfort while wearing tight-fitting shoes, if your feet are affected. If a corn or callus becomes infected, symptoms may include: Redness and swelling that gets worse. Pain. Fluid, blood, or pus draining from the corn or callus. How is this diagnosed? Corns and calluses may be diagnosed based on your symptoms, your medical history, and a physical exam. How is this treated? Treatment for corns and calluses may include: Removing the cause of the friction or pressure. This may involve: Changing your shoes. Wearing shoe inserts (orthotics) or other protective layers in your shoes, such as a corn pad. Wearing gloves. Applying medicine to the skin (topical medicine) to help soften skin in the hardened, thickened areas. Removing layers of dead skin with a file to reduce the size of the corn or callus. Removing the corn or callus with a scalpel or laser. Taking antibiotic  medicines, if your corn or callus is infected. Having surgery, if a toe deformity is the cause. Follow these instructions at home:  Take over-the-counter and prescription medicines only as told by your health care provider. If you were prescribed an antibiotic medicine, take it as told by your health care provider. Do not stop taking it even if your condition improves. Wear shoes that fit well. Avoid wearing high-heeled shoes and shoes that are too tight or too loose. Wear any padding, protective layers, gloves, or orthotics as told by your health care provider. Soak your hands or feet. Then use a file or pumice stone to soften your corn or callus. Do this as told by your health care provider. Check your corn or callus every day for signs of infection. Contact a health care provider if: Your symptoms do not improve with treatment. You have redness or swelling that gets worse. Your corn or callus becomes painful. You have fluid, blood, or pus coming from your corn or callus. You have new symptoms. Get help right away if: You develop severe pain with redness. Summary Corns are small areas of thickened skin that form on the top, sides, or tip of a toe. These can be painful. Calluses are areas of thickened skin that can form anywhere on the body, including the hands, fingers, palms, and soles of the feet. Calluses are usually larger than corns. Corns and calluses are caused by rubbing (friction) or pressure, such as from shoes that are too tight or do not fit properly. Treatment may include wearing padding, protective  layers, gloves, or orthotics as told by your health care provider. This information is not intended to replace advice given to you by your health care provider. Make sure you discuss any questions you have with your health care provider. Document Revised: 04/17/2019 Document Reviewed: 04/17/2019 Elsevier Patient Education  2024 [employer].

## 2022-11-14 NOTE — Progress Notes (Signed)
FOLLOW UP  Assessment:   Kristina Hunter was seen today for medicare wellness and follow-up.  Diagnoses and all orders for this visit:  CPE Due annually  Health maintenance reviewed Healthily lifestyle goals set  CKD stage 3a(HCC) Discussed how what you eat and drink can aide in kidney protection. Stay well hydrated. Avoid high salt foods. Avoid NSAIDS. Keep BP and BG well controlled.   Take medications as prescribed. Remain active and exercise as tolerated daily. Maintain weight.  Continue to monitor. Check CMP/GFR/Microablumin  Labile hypertension Discussed DASH (Dietary Approaches to Stop Hypertension) DASH diet is lower in sodium than a typical American diet. Cut back on foods that are high in saturated fat, cholesterol, and trans fats. Eat more whole-grain foods, fish, poultry, and nuts Remain active and exercise as tolerated daily.  Monitor BP at home-Call if greater than 130/80.  Check CMP/CBC  Restless leg syndrome Intermittent, does not wish to pursue medication  Continue to monitor symptoms  Hyperlipidemia, mixed Discussed lifestyle modifications. Recommended diet heavy in fruits and veggies, omega 3's. Decrease consumption of animal meats, cheeses, and dairy products. Remain active and exercise as tolerated. Continue to monitor. Check lipids/TSH  Abnormal glucose Education: Reviewed 'ABCs' of diabetes management  Discussed goals to be met and/or maintained include A1C (<7) Blood pressure (<130/80) Cholesterol (LDL <70) Continue Eye Exam yearly  Continue Dental Exam Q6 mo Discussed dietary recommendations Discussed Physical Activity recommendations Check A1C  Vitamin D deficiency Continue supplement for goal of 60-100 Monitor Vitamin D levels  Medication management All medications discussed and reviewed in full. All questions and concerns regarding medications addressed.    Gastroesophageal reflux disease, unspecified whether esophagitis present No  suspected reflux complications (Barret/stricture). Lifestyle modification:  wt loss, avoid meals 2-3h before bedtime. Consider eliminating food triggers:  chocolate, caffeine, EtOH, acid/spicy food.  Obesity (BMI 30.0-34.9) Discussed appropriate BMI Diet modification. Physical activity. Encouraged/praised to build confidence.  Trigger finger of ring finger left hand Does not currently want intervention Continue to monitor  Left 2nd toe callous  Picture noted below Continue to monitor and if worsens contact office to have removed by  Dr. Oneta Rack  Orders Placed This Encounter  Procedures   Flu vaccine HIGH DOSE PF(Fluzone Trivalent)   CBC with Differential/Platelet   COMPLETE METABOLIC PANEL WITH GFR   Magnesium   Lipid panel   TSH   Hemoglobin A1c   Insulin, random   VITAMIN D 25 Hydroxy (Vit-D Deficiency, Fractures)   Urinalysis, Routine w reflex microscopic   Microalbumin / creatinine urine ratio   EKG 12-Lead    Notify office for further evaluation and treatment, questions or concerns if any reported s/s fail to improve.   The patient was advised to call back or seek an in-person evaluation if any symptoms worsen or if the condition fails to improve as anticipated.   Further disposition pending results of labs. Discussed med's effects and SE's.    I discussed the assessment and treatment plan with the patient. The patient was provided an opportunity to ask questions and all were answered. The patient agreed with the plan and demonstrated an understanding of the instructions.  Discussed med's effects and SE's. Screening labs and tests as requested with regular follow-up as recommended.  I provided 35 minutes of face-to-face time during this encounter including counseling, chart review, and critical decision making was preformed.  Today's Plan of Care is based on a patient-centered health care approach known as shared decision making - the decisions, tests and treatments  allow for patient preferences and values to be balanced with clinical evidence.    Future Appointments  Date Time Provider Department Center  05/21/2023 11:00 AM Raynelle Dick, NP [facility] None  11/14/2023  9:00 AM Adela Glimpse, NP [facility] None     Plan:   During the course of the visit the patient was educated and counseled about appropriate screening and preventive services including:   Pneumococcal vaccine  Prevnar 13 Influenza vaccine Td vaccine Screening electrocardiogram Bone densitometry screening Colorectal cancer screening Diabetes screening Glaucoma screening Nutrition counseling  Advanced directives: requested   Subjective:  Kristina Hunter is a 67 y.o. female who presents for CPE and  follow up. has Hyperlipidemia; Environmental allergies; Other abnormal glucose; Obesity (BMI 30.0-34.9); Labile hypertension; OAB (overactive bladder); Vitamin D deficiency; Acid reflux; Hepatic steatosis; and Persistent proteinuria on their problem list.    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 Has 3 kids, 5 grandsons and 3 grand daughters. They have been traveling with their camper.   Overall she reports feeling well today.  She does note a recurring callous on the 2nd digit of left foot.  At times this will become inflamed and pus will drain.  No concerns at this time but area continues to remain present and "annoying."  She had some tingling in her feet but is avery rare occurrence and usually only occurs with restless legs. States it is still infrequent enough that she does not wish to have medication.   She does take famotidine daily 10 mg for reflux symptoms, has improved persistent cough.  She is trying to decrease food triggers. She is sleeping up on pillows.  Also having some trigger finger symptoms of 4th left finger.  Can currently get it to move without difficulty  She was on myrbetriq for OAB/incontinence but didn't feel  this helped. Previously on vesicare which worked better but cost prohibitive. Feels she is doing fairly without agent at this time. She wears a panty liner, minimal leakage.   BMI is Body mass index is 33.14 kg/m., she has been working on diet and exercise.  Wt Readings from Last 3 Encounters:  11/14/22 224 lb 6.4 oz (101.8 kg)  05/19/22 225 lb 6.4 oz (102.2 kg)  03/28/22 225 lb (102.1 kg)   Her blood pressure has been controlled at home, today their BP is BP: 130/84  BP Readings from Last 3 Encounters:  11/14/22 130/84  05/19/22 122/70  03/28/22 120/60  She does workout. She denies chest pain, shortness of breath, dizziness.   She is on cholesterol medication (rosuvastatin 10 mg daily) and denies myalgias. Her cholesterol is at goal.She is limiting red meat, saturated fats and simple carbs.  The cholesterol last visit was:   Lab Results  Component Value Date   CHOL 155 05/19/2022   HDL 47 (L) 05/19/2022   LDLCALC 82 05/19/2022   TRIG 161 (H) 05/19/2022   CHOLHDL 3.3 05/19/2022   She has had prediabetes predating 2015. She has been working on diet and exercise for prediabetes, and denies increased appetite, nausea, paresthesia of the feet, polydipsia, polyuria, visual disturbances, vomiting and weight loss. Last A1C in the office was:  Lab Results  Component Value Date   HGBA1C 6.1 (H) 05/19/2022   Reports 64+ fluid ounces minimum daily. Last GFR: Lab Results  Component Value Date   EGFR 64 05/19/2022    Patient is on Vitamin D supplement.   Lab Results  Component Value  Date   VD25OH 54 11/11/2021      Medication Review: Current Outpatient Medications on File Prior to Visit  Medication Sig Dispense Refill   BABY ASPIRIN PO Take 81 mg by mouth daily.     Calcium-Vitamin D (CALTRATE 600 PLUS-VIT D PO) Take by mouth daily.     CHOLECALCIFEROL PO Take 2,000 Units by mouth daily.     Cyanocobalamin (B-12) 1000 MCG TABS Take 1,000 mcg by mouth.     desloratadine (CLARINEX)  5 MG tablet TAKE 1 TABLET DAILY FOR    ALLERGIES 90 tablet 3   famotidine (PEPCID) 10 MG tablet Take 10 mg by mouth daily.     Multiple Vitamins-Minerals (MULTIVITAMIN PO) Take by mouth daily.     Omega-3 Fatty Acids (FISH OIL PO) Take by mouth daily.     rosuvastatin (CRESTOR) 10 MG tablet Take     1 tablet      Daily      for Cholesterol 90 tablet 3   vitamin C (ASCORBIC ACID) 500 MG tablet Take 500 mg by mouth daily.     Zinc 25 MG TABS Take 1 tablet by mouth daily.     promethazine-dextromethorphan (PROMETHAZINE-DM) 6.25-15 MG/5ML syrup Take 5 mLs by mouth 4 (four) times daily as needed for cough. (Patient not taking: Reported on 11/14/2022) 240 mL 1   No current facility-administered medications on file prior to visit.    No Known Allergies  Current Problems (verified) Patient Active Problem List   Diagnosis Date Noted   Persistent proteinuria 11/12/2020   Hepatic steatosis 05/13/2019   Acid reflux 05/06/2019   Vitamin D deficiency 05/14/2018   OAB (overactive bladder) 11/06/2017   Other abnormal glucose 10/20/2014   Obesity (BMI 30.0-34.9) 10/20/2014   Labile hypertension 10/20/2014   Hyperlipidemia    Environmental allergies     Screening Tests Immunization History  Administered Date(s) Administered   Influenza Inj Mdck Quad With Preservative 10/24/2016, 11/07/2017   Influenza Split 10/20/2014   Influenza, High Dose Seasonal PF 11/11/2018, 11/11/2019, 11/11/2020, 11/11/2021, 11/14/2022   Influenza, Seasonal, Injecte, Preservative Fre 10/20/2015   Pneumococcal Polysaccharide-23 08/02/2020   Tdap 07/13/2009, 08/02/2020   Zoster Recombinant(Shingrix) 02/24/2021   Health Maintenance  Topic Date Due   COVID-19 Vaccine (1) Never done   Pneumonia Vaccine 64+ Years old (2 of 2 - PCV) 05/19/2023 (Originally 08/02/2021)   Fecal DNA (Cologuard)  03/05/2023   Medicare Annual Wellness (AWV)  05/19/2023   MAMMOGRAM  03/27/2024   DTaP/Tdap/Td (3 - Td or Tdap) 08/03/2030    INFLUENZA VACCINE  Completed   DEXA SCAN  Completed   Hepatitis C Screening  Completed   Zoster Vaccines- Shingrix  Completed   HPV VACCINES  Aged Out     Names of Other Physician/Practitioners you currently use: 1. [facility] here for primary care 2. Dr. Jimmye Norman, eye doctor, last visit 2024 3.[facility], dentist, last visit 06/2022 4. Dr. Terri Piedra, derm, last visit 2021, goes annually   Patient Care Team: Lucky Cowboy, MD as PCP - General (Internal Medicine) Teryl Lucy, MD as Consulting Physician (Orthopedic Surgery) Cherlyn Roberts, MD as Consulting Physician (Dermatology) [name], Willaim Rayas, MD as Consulting Physician (Gastroenterology)  SURGICAL HISTORY She  has a past surgical history that includes Tubal ligation (Bilateral, 1986); Abdominal hysterectomy (1990); and Cervical disc surgery (2000). FAMILY HISTORY Her family history includes Arthritis in her father and mother; Dementia in her mother; Diabetes in her father; Heart attack in her  paternal grandfather; Hyperlipidemia in her father and mother; Hypertension in her father and mother. SOCIAL HISTORY She  reports that she has never smoked. She has never used smokeless tobacco. She reports that she does not drink alcohol and does not use drugs.   Review of Systems  Constitutional:  Negative for chills, fever, malaise/fatigue and weight loss.  HENT:  Negative for congestion, hearing loss and tinnitus.   Eyes:  Negative for blurred vision and double vision.  Respiratory:  Negative for cough, sputum production, shortness of breath and wheezing.   Cardiovascular:  Negative for chest pain, palpitations, orthopnea, claudication, leg swelling and PND.  Gastrointestinal:  Positive for heartburn. Negative for abdominal pain, blood in stool, constipation, diarrhea, melena, nausea and vomiting.  Genitourinary: Negative.   Musculoskeletal:  Positive for back pain (stands  long periods pain in lower back) and joint pain (left ring finger trigger finger). Negative for falls and myalgias.  Skin:  Negative for rash.       Left 2nd toe callous  Neurological:  Positive for tingling (occ right hand, has been since cervical fusion, occasional tingling of feet but infreauent and only on nights with restless leg). Negative for dizziness, tremors, sensory change, loss of consciousness, weakness and headaches.  Endo/Heme/Allergies:  Positive for environmental allergies. Negative for polydipsia.  Psychiatric/Behavioral: Negative.  Negative for depression, memory loss, substance abuse and suicidal ideas. The patient is not nervous/anxious and does not have insomnia.   All other systems reviewed and are negative.    Objective:     Today's Vitals   11/14/22 0853  BP: 130/84  Pulse: 84  Resp: 16  Temp: 98 F (36.7 C)  SpO2: 97%  Weight: 224 lb 6.4 oz (101.8 kg)  Height: 5\' 9"  (1.753 m)    Body mass index is 33.14 kg/m.  General appearance: alert, no distress, WD/WN, female HEENT: normocephalic, sclerae anicteric, TMs pearly, nares patent, no discharge or erythema, pharynx normal Oral cavity: MMM, no lesions Neck: supple, no lymphadenopathy, no thyromegaly, no masses Heart: RRR, normal S1, S2, no murmurs Lungs: CTA bilaterally, no wheezes, rhonchi, or rales Skin:Small See picture below.  Otherwise WNL, appropriate color for ethnicity, warm.   Abdomen: +bs, soft, non tender, non distended, no masses, no hepatomegaly, no splenomegaly Musculoskeletal: nontender, no swelling, no obvious deformity Extremities: no edema, no cyanosis, no clubbing Pulses: 2+ symmetric, upper and lower extremities, normal cap refill Neurological: alert, oriented x 3, CN2-12 intact, strength normal upper extremities and lower extremities, sensation normal throughout, DTRs 2+ throughout, no cerebellar signs, gait normal Psychiatric: normal affect, behavior normal, pleasant     Adela Glimpse, NP  [facility]  11/14/2022

## 2022-11-15 DIAGNOSIS — E559 Vitamin D deficiency, unspecified: Secondary | ICD-10-CM | POA: Diagnosis not present

## 2022-11-15 DIAGNOSIS — Z0001 Encounter for general adult medical examination with abnormal findings: Secondary | ICD-10-CM | POA: Diagnosis not present

## 2022-11-15 DIAGNOSIS — N1831 Chronic kidney disease, stage 3a: Secondary | ICD-10-CM | POA: Diagnosis not present

## 2022-11-15 DIAGNOSIS — Z1389 Encounter for screening for other disorder: Secondary | ICD-10-CM | POA: Diagnosis not present

## 2022-11-15 DIAGNOSIS — R7309 Other abnormal glucose: Secondary | ICD-10-CM | POA: Diagnosis not present

## 2022-11-15 DIAGNOSIS — E782 Mixed hyperlipidemia: Secondary | ICD-10-CM | POA: Diagnosis not present

## 2022-11-15 DIAGNOSIS — Z79899 Other long term (current) drug therapy: Secondary | ICD-10-CM | POA: Diagnosis not present

## 2022-11-15 DIAGNOSIS — K219 Gastro-esophageal reflux disease without esophagitis: Secondary | ICD-10-CM | POA: Diagnosis not present

## 2022-11-15 DIAGNOSIS — R0989 Other specified symptoms and signs involving the circulatory and respiratory systems: Secondary | ICD-10-CM | POA: Diagnosis not present

## 2022-11-15 LAB — COMPLETE METABOLIC PANEL WITH GFR
AG Ratio: 1.5 (calc) (ref 1.0–2.5)
ALT: 21 U/L (ref 6–29)
AST: 21 U/L (ref 10–35)
Albumin: 4.3 g/dL (ref 3.6–5.1)
Alkaline phosphatase (APISO): 81 U/L (ref 37–153)
BUN: 21 mg/dL (ref 7–25)
CO2: 27 mmol/L (ref 20–32)
Calcium: 9.5 mg/dL (ref 8.6–10.4)
Chloride: 104 mmol/L (ref 98–110)
Creat: 0.98 mg/dL (ref 0.50–1.05)
Globulin: 2.8 g/dL (ref 1.9–3.7)
Glucose, Bld: 99 mg/dL (ref 65–99)
Potassium: 4.7 mmol/L (ref 3.5–5.3)
Sodium: 139 mmol/L (ref 135–146)
Total Bilirubin: 0.5 mg/dL (ref 0.2–1.2)
Total Protein: 7.1 g/dL (ref 6.1–8.1)
eGFR: 62 mL/min/{1.73_m2} (ref >=60)

## 2022-11-15 LAB — TSH: TSH: 1.38 m[IU]/L (ref 0.40–4.50)

## 2022-11-15 LAB — CBC WITH DIFFERENTIAL/PLATELET
Absolute Lymphocytes: 1636 {cells}/uL (ref 850–3900)
Absolute Monocytes: 346 {cells}/uL (ref 200–950)
Basophils Absolute: 49 {cells}/uL (ref 0–200)
Basophils Relative: 0.9 %
Eosinophils Absolute: 151 {cells}/uL (ref 15–500)
Eosinophils Relative: 2.8 %
HCT: 44.5 % (ref 35.0–45.0)
Hemoglobin: 14.6 g/dL (ref 11.7–15.5)
MCH: 31.3 pg (ref 27.0–33.0)
MCHC: 32.8 g/dL (ref 32.0–36.0)
MCV: 95.5 fL (ref 80.0–100.0)
MPV: 10.1 fL (ref 7.5–12.5)
Monocytes Relative: 6.4 %
Neutro Abs: 3218 {cells}/uL (ref 1500–7800)
Neutrophils Relative %: 59.6 %
Platelets: 316 10*3/uL (ref 140–400)
RBC: 4.66 10*6/uL (ref 3.80–5.10)
RDW: 11.6 % (ref 11.0–15.0)
Total Lymphocyte: 30.3 %
WBC: 5.4 10*3/uL (ref 3.8–10.8)

## 2022-11-15 LAB — LIPID PANEL
Cholesterol: 177 mg/dL (ref <200)
HDL: 45 mg/dL — ABNORMAL LOW (ref >=50)
LDL Cholesterol (Calc): 93 mg/dL
Non-HDL Cholesterol (Calc): 132 mg/dL — ABNORMAL HIGH (ref <130)
Total CHOL/HDL Ratio: 3.9 (calc) (ref <5.0)
Triglycerides: 272 mg/dL — ABNORMAL HIGH (ref <150)

## 2022-11-15 LAB — INSULIN, RANDOM: Insulin: 16.2 u[IU]/mL

## 2022-11-15 LAB — HEMOGLOBIN A1C
Hgb A1c MFr Bld: 6 %{Hb} — ABNORMAL HIGH (ref <5.7)
Mean Plasma Glucose: 126 mg/dL
eAG (mmol/L): 7 mmol/L

## 2022-11-15 LAB — MAGNESIUM: Magnesium: 2.4 mg/dL (ref 1.5–2.5)

## 2022-11-15 LAB — VITAMIN D 25 HYDROXY (VIT D DEFICIENCY, FRACTURES): Vit D, 25-Hydroxy: 47 ng/mL (ref 30–100)

## 2022-11-16 LAB — URINALYSIS, ROUTINE W REFLEX MICROSCOPIC
Bilirubin Urine: NEGATIVE
Glucose, UA: NEGATIVE
Hgb urine dipstick: NEGATIVE
Hyaline Cast: NONE SEEN /[LPF]
Ketones, ur: NEGATIVE
Nitrite: NEGATIVE
Protein, ur: NEGATIVE
Specific Gravity, Urine: 1.012 (ref 1.001–1.035)
pH: 6.5 (ref 5.0–8.0)

## 2022-11-16 LAB — MICROSCOPIC MESSAGE

## 2022-11-16 LAB — MICROALBUMIN / CREATININE URINE RATIO
Creatinine, Urine: 91 mg/dL (ref 20–275)
Microalb Creat Ratio: 13 mg/g{creat} (ref <30)
Microalb, Ur: 1.2 mg/dL

## 2023-02-16 ENCOUNTER — Encounter: Payer: Self-pay | Admitting: *Deleted

## 2023-02-16 ENCOUNTER — Ambulatory Visit
Admission: EM | Admit: 2023-02-16 | Discharge: 2023-02-16 | Disposition: A | Discharge status: Home / Self Care | Payer: Medicare HMO | Attending: Nurse Practitioner | Admitting: Nurse Practitioner

## 2023-02-16 DIAGNOSIS — N3000 Acute cystitis without hematuria: Secondary | ICD-10-CM | POA: Diagnosis present

## 2023-02-16 LAB — POCT URINALYSIS DIP (MANUAL ENTRY)
Bilirubin, UA: NEGATIVE
Glucose, UA: NEGATIVE mg/dL
Nitrite, UA: POSITIVE — AB
Protein Ur, POC: 100 mg/dL — AB
Spec Grav, UA: >=1.03 — AB
Urobilinogen, UA: 1 U/dL
pH, UA: 5.5

## 2023-02-16 MED ORDER — CEPHALEXIN 500 MG PO CAPS
500.0000 mg | ORAL_CAPSULE | Freq: Four times a day (QID) | ORAL | 0 refills | Status: AC
Start: 2023-02-16 — End: 2023-02-23

## 2023-02-16 NOTE — ED Provider Notes (Signed)
 [facility]    CSN: [id number] Arrival date & time: 02/16/23  1426      History   Chief Complaint Chief Complaint  Patient presents with   Dysuria    HPI Kristina Hunter is a 68 y.o. female.   The history is provided by the patient.   Patient presents with a 3-day history of pain with urination, urinary frequency, urgency, suprapubic pressure, and cloudy urine.  Patient denies fever, chills, hematuria, decreased urine stream, low back pain, flank pain, or vaginal symptoms.  Patient denies history of recurrent UTIs, states her last UTI was approximately 2 years ago.  Past Medical History:  Diagnosis Date   Allergy    Cervical disc disorder    s/p fusion    Hyperlipidemia     Patient Active Problem List   Diagnosis Date Noted   Persistent proteinuria 11/12/2020   Hepatic steatosis 05/13/2019   Acid reflux 05/06/2019   Vitamin D deficiency 05/14/2018   OAB (overactive bladder) 11/06/2017   Other abnormal glucose 10/20/2014   Obesity (BMI 30.0-34.9) 10/20/2014   Labile hypertension 10/20/2014   Hyperlipidemia    Environmental allergies     Past Surgical History:  Procedure Laterality Date   ABDOMINAL HYSTERECTOMY  1990   with left salpingooopherectomy   CERVICAL DISC SURGERY  2000   TUBAL LIGATION Bilateral 1986   BTL    OB History   No obstetric history on file.      Home Medications    Prior to Admission medications   Medication Sig Start Date End Date Taking? Authorizing Provider  cephALEXin (KEFLEX) 500 MG capsule Take 1 capsule (500 mg total) by mouth 4 (four) times daily for 7 days. 02/16/23 02/23/23 Yes Leath-Warren, Sadie Haber, NP  rosuvastatin (CRESTOR) 10 MG tablet Take     1 tablet      Daily      for Cholesterol 09/21/22  Yes Raynelle Dick, NP  BABY ASPIRIN PO Take 81 mg by mouth daily.    [provider]  Calcium-Vitamin D (CALTRATE 600 PLUS-VIT D PO) Take by mouth daily.    [provider]  CHOLECALCIFEROL PO  Take 2,000 Units by mouth daily.    [provider]  Cyanocobalamin (B-12) 1000 MCG TABS Take 1,000 mcg by mouth.    [provider]  desloratadine (CLARINEX) 5 MG tablet TAKE 1 TABLET DAILY FOR    ALLERGIES 11/18/17   Lucky Cowboy, MD  famotidine (PEPCID) 10 MG tablet Take 10 mg by mouth daily.    [provider]  Multiple Vitamins-Minerals (MULTIVITAMIN PO) Take by mouth daily.    [provider]  Omega-3 Fatty Acids (FISH OIL PO) Take by mouth daily.    [provider]  promethazine-dextromethorphan (PROMETHAZINE-DM) 6.25-15 MG/5ML syrup Take 5 mLs by mouth 4 (four) times daily as needed for cough. Patient not taking: Reported on 11/14/2022 03/28/22   Raynelle Dick, NP  vitamin C (ASCORBIC ACID) 500 MG tablet Take 500 mg by mouth daily.    [provider]  Zinc 25 MG TABS Take 1 tablet by mouth daily.    [provider]    Family History Family History  Problem Relation Age of Onset   Arthritis Mother    Hyperlipidemia Mother    Hypertension Mother    Dementia Mother    Hypertension Father    Diabetes Father    Hyperlipidemia Father    Arthritis Father    Heart attack Paternal Grandfather  In 59s   Breast cancer Neg Hx     Social History Social History   Tobacco Use   Smoking status: Never   Smokeless tobacco: Never  Vaping Use   Vaping status: Never Used  Substance Use Topics   Alcohol use: No   Drug use: No     Allergies   Patient has no known allergies.   Review of Systems Review of Systems Per HPI  Physical Exam Triage Vital Signs ED Triage Vitals  Encounter Vitals Group     BP 02/16/23 1518 133/84     Systolic BP Percentile --      Diastolic BP Percentile --      Pulse Rate 02/16/23 1518 72     Resp 02/16/23 1518 16     Temp 02/16/23 1518 97.9 F (36.6 C)     Temp Source 02/16/23 1518 Oral     SpO2 02/16/23 1518 94 %     Weight --      Height --      Head Circumference  --      Peak Flow --      Pain Score 02/16/23 1517 5     Pain Loc --      Pain Education --      Exclude from Growth Chart --    No data found.  Updated Vital Signs BP 133/84 (BP Location: Right Arm)   Pulse 72   Temp 97.9 F (36.6 C) (Oral)   Resp 16   SpO2 94%   Visual Acuity Right Eye Distance:   Left Eye Distance:   Bilateral Distance:    Right Eye Near:   Left Eye Near:    Bilateral Near:     Physical Exam Vitals and nursing note reviewed.  Constitutional:      General: She is not in acute distress.    Appearance: Normal appearance.  HENT:     Head: Normocephalic.  Eyes:     Extraocular Movements: Extraocular movements intact.     Pupils: Pupils are equal, round, and reactive to light.  Cardiovascular:     Rate and Rhythm: Regular rhythm.     Pulses: Normal pulses.     Heart sounds: Normal heart sounds.  Pulmonary:     Effort: Pulmonary effort is normal.     Breath sounds: Normal breath sounds.  Abdominal:     General: Bowel sounds are normal.     Palpations: Abdomen is soft.     Tenderness: There is no abdominal tenderness. There is no right CVA tenderness or left CVA tenderness.  Skin:    General: Skin is warm and dry.  Neurological:     General: No focal deficit present.     Mental Status: She is alert and oriented to person, place, and time.  Psychiatric:        Mood and Affect: Mood normal.        Behavior: Behavior normal.      UC Treatments / Results  Labs (all labs ordered are listed, but only abnormal results are displayed) Labs Reviewed  POCT URINALYSIS DIP (MANUAL ENTRY) - Abnormal; Notable for the following components:      Result Value   Clarity, UA cloudy (*)    Ketones, POC UA trace (5) (*)    Spec Grav, UA >=1.030 (*)    Blood, UA large (*)    Protein Ur, POC =100 (*)    Nitrite, UA Positive (*)    Leukocytes, UA Trace (*)  All other components within normal limits  URINE CULTURE    EKG   Radiology No results  found.  Procedures Procedures (including critical care time)  Medications Ordered in UC Medications - No data to display  Initial Impression / Assessment and Plan / UC Course  I have reviewed the triage vital signs and the nursing notes.  Pertinent labs & imaging results that were available during my care of the patient were reviewed by me and considered in my medical decision making (see chart for details).  Urinalysis is positive for leukocytes, nitrates, blood and protein, consistent with acute cystitis.  Will treat with Keflex 500 mg 4 times daily x 7 days.  Supportive care recommendations were provided and discussed with the patient to include fluids, over-the-counter analgesics, developing a toileting schedule, and avoiding caffeine.  Discussed ER follow-up precautions.  Patient was in agreement with this plan of care and verbalized understanding.  All questions were answered.  Patient stable for discharge.   Final Clinical Impressions(s) / UC Diagnoses   Final diagnoses:  Acute cystitis without hematuria     Discharge Instructions      -Your urinalysis shows that you do have a urinary tract infection.  A urine culture has been ordered to ensure you are being treated with the appropriate antibiotic.  If the medication needs to be changed, you will be contacted. -Take medications as prescribed. -Increase fluids.  Try to drink at least 8-10 eight ounce glasses of water daily while symptoms persist. -Tylenol for pain, fever, or general discomfort. -Develop a toileting schedule that will allow you to urinate at least every 2 hours. -Avoid caffeine to include tea, soda, and coffee while symptoms persist. -If sexually active, void at least 15 to 20 minutes after sexual intercourse. -Follow-up in the emergency department if you develop worsening urinary symptoms with fever, chills, worsening abdominal pain, or other concerns.  -Follow-up as needed.     ED Prescriptions      Medication Sig Dispense Auth. Provider   cephALEXin (KEFLEX) 500 MG capsule Take 1 capsule (500 mg total) by mouth 4 (four) times daily for 7 days. 28 capsule Leath-Warren, Sadie Haber, NP      PDMP not reviewed this encounter.   Abran Cantor, NP 02/17/23 [phone]

## 2023-02-16 NOTE — Discharge Instructions (Addendum)
-  Your urinalysis shows that you do have a urinary tract infection.  A urine culture has been ordered to ensure you are being treated with the appropriate antibiotic.  If the medication needs to be changed, you will be contacted. -Take medications as prescribed. -Increase fluids.  Try to drink at least 8-10 eight ounce glasses of water daily while symptoms persist. -Tylenol for pain, fever, or general discomfort. -Develop a toileting schedule that will allow you to urinate at least every 2 hours. -Avoid caffeine to include tea, soda, and coffee while symptoms persist. -If sexually active, void at least 15 to 20 minutes after sexual intercourse. -Follow-up in the emergency department if you develop worsening urinary symptoms with fever, chills, worsening abdominal pain, or other concerns.  -Follow-up as needed.

## 2023-02-16 NOTE — ED Triage Notes (Signed)
Pt states she had has dysuria, urinary pressure cloudy urine X 2-3 days. She has been taking aleve.

## 2023-02-18 LAB — URINE CULTURE: Culture: >=100000 — AB

## 2023-03-27 ENCOUNTER — Encounter: Payer: Self-pay | Admitting: Adult Health

## 2023-03-27 ENCOUNTER — Ambulatory Visit

## 2023-03-27 ENCOUNTER — Ambulatory Visit (INDEPENDENT_AMBULATORY_CARE_PROVIDER_SITE_OTHER): Admitting: Adult Health

## 2023-03-27 VITALS — BP 130/86 | HR 73 | Temp 97.8°F | Ht 68.15 in | Wt 229.0 lb

## 2023-03-27 DIAGNOSIS — E559 Vitamin D deficiency, unspecified: Secondary | ICD-10-CM

## 2023-03-27 DIAGNOSIS — M25562 Pain in left knee: Secondary | ICD-10-CM | POA: Diagnosis not present

## 2023-03-27 DIAGNOSIS — M545 Low back pain, unspecified: Secondary | ICD-10-CM

## 2023-03-27 DIAGNOSIS — M2570 Osteophyte, unspecified joint: Secondary | ICD-10-CM

## 2023-03-27 DIAGNOSIS — R300 Dysuria: Secondary | ICD-10-CM

## 2023-03-27 DIAGNOSIS — G8929 Other chronic pain: Secondary | ICD-10-CM

## 2023-03-27 DIAGNOSIS — R7303 Prediabetes: Secondary | ICD-10-CM | POA: Diagnosis not present

## 2023-03-27 DIAGNOSIS — G2581 Restless legs syndrome: Secondary | ICD-10-CM | POA: Diagnosis not present

## 2023-03-27 DIAGNOSIS — Z1211 Encounter for screening for malignant neoplasm of colon: Secondary | ICD-10-CM

## 2023-03-27 DIAGNOSIS — Z7689 Persons encountering health services in other specified circumstances: Secondary | ICD-10-CM

## 2023-03-27 DIAGNOSIS — E785 Hyperlipidemia, unspecified: Secondary | ICD-10-CM

## 2023-03-27 DIAGNOSIS — K219 Gastro-esophageal reflux disease without esophagitis: Secondary | ICD-10-CM

## 2023-03-27 NOTE — Progress Notes (Signed)
 Patient presents to clinic today to establish care. She is a 68 year old female who  has a past medical history of Allergy, Cervical disc disorder, and Hyperlipidemia.  Her last CPE was in 11/14/2022  Acute Concerns: Establish Care   Chronic Issues: Hyperlipidemia - managed with Crestor 10 mg daily. She denies myalgia or fatigue  Lab Results  Component Value Date   CHOL 177 11/14/2022   HDL 45 (L) 11/14/2022   LDLCALC 93 11/14/2022   TRIG 272 (H) 11/14/2022   CHOLHDL 3.9 11/14/2022   Restless Leg Syndrome - her symptoms are intermittent and she does not wish to pursue medication.   Vitamin D Deficiency - takes OTC vitamin D  Last vitamin D Lab Results  Component Value Date   VD25OH 63 11/14/2022   Prediabetes - this goes back to 2015. She is not currently on medication. She is working on weight loss through lifestyle modifications.  Lab Results  Component Value Date   HGBA1C 6.0 (H) 11/14/2022   HGBA1C 6.1 (H) 05/19/2022   HGBA1C 6.0 (H) 11/11/2021   GERD - takes Famotidine 10 mg daily. This works for the most part but will still have symptoms with spicy foods.   Chronic Low Back Pain -this is an ongoing issue.  She reports that she has low back pain that is worse when standing for long periods of time.  Sitting instantly alleviates her back pain.  She denies any issues with sciatica.  Acute left sided knee pain -orts that over the last month she has had intermittent pain on the inside of her left knee.  She has noticed swelling from time to time.  Denies redness or warmth.  Has not had any trauma or aggravating injury.  She will apply topical creams to help alleviate the pain  Dysuria -she had a UTI in February and was treated with Keflex, symptoms resolved for a short period of time but then came back and she had another UTI here this month and was treated with a course of Macrobid.  She has been off her antibiotics for about 12 days the symptoms have recently  reappeared, symptoms include slight burning with urination, cloudy urine, and a heavy feeling in her lower abdomen.  Prior to her UTIs in February and March her previous one was about 2 years ago.   Health Maintenance: Dental -- Routine Care  Vision -- Routine Care  Immunizations -- Colon cancer screening - has had negative cologuards in 2019 and 2022. She is due  Mammogram -- 03/2022- normal PAP --  Bone Density -- 07/2022 with a T score -0.6    Past Medical History:  Diagnosis Date   Allergy    Cervical disc disorder    s/p fusion    Hyperlipidemia     Past Surgical History:  Procedure Laterality Date   ABDOMINAL HYSTERECTOMY  1990   with left salpingooopherectomy   CERVICAL DISC SURGERY  2000   TUBAL LIGATION Bilateral 1986   BTL    Current Outpatient Medications on File Prior to Visit  Medication Sig Dispense Refill   BABY ASPIRIN PO Take 81 mg by mouth daily.     Calcium-Vitamin D (CALTRATE 600 PLUS-VIT D PO) Take by mouth daily.     CHOLECALCIFEROL PO Take 2,000 Units by mouth daily.     Cyanocobalamin (B-12) 1000 MCG TABS Take 1,000 mcg by mouth.     famotidine (PEPCID) 10 MG tablet Take 10 mg by mouth daily.  ketoconazole (NIZORAL) 2 % shampoo      Multiple Vitamins-Minerals (MULTIVITAMIN PO) Take by mouth daily.     Omega-3 Fatty Acids (FISH OIL PO) Take by mouth daily.     rosuvastatin (CRESTOR) 10 MG tablet Take     1 tablet      Daily      for Cholesterol 90 tablet 3   vitamin C (ASCORBIC ACID) 500 MG tablet Take 500 mg by mouth daily.     Zinc 25 MG TABS Take 1 tablet by mouth daily.     No current facility-administered medications on file prior to visit.    No Known Allergies  Family History  Problem Relation Age of Onset   Arthritis Mother    Hyperlipidemia Mother    Hypertension Mother    Dementia Mother    Hypertension Father    Diabetes Father    Hyperlipidemia Father    Arthritis Father    Heart attack Paternal Grandfather        In  8s   Breast cancer Neg Hx     Social History   Socioeconomic History   Marital status: Married    Spouse name: Not on file   Number of children: 3   Years of education: Not on file   Highest education level: 12th grade  Occupational History   Not on file  Tobacco Use   Smoking status: Never   Smokeless tobacco: Never  Vaping Use   Vaping status: Never Used  Substance and Sexual Activity   Alcohol use: No   Drug use: No   Sexual activity: Yes    Partners: Male    Birth control/protection: Post-menopausal  Other Topics Concern   Not on file  Social History Narrative   Not on file   Social Drivers of Health   Financial Resource Strain: Low Risk  (03/25/2023)   Overall Financial Resource Strain (CARDIA)    Difficulty of Paying Living Expenses: Not hard at all  Food Insecurity: No Food Insecurity (03/25/2023)   Hunger Vital Sign    Worried About Running Out of Food in the Last Year: Never true    Ran Out of Food in the Last Year: Never true  Transportation Needs: No Transportation Needs (03/25/2023)   PRAPARE - Administrator, Civil Service (Medical): No    Lack of Transportation (Non-Medical): No  Physical Activity: Insufficiently Active (03/25/2023)   Exercise Vital Sign    Days of Exercise per Week: 2 days    Minutes of Exercise per Session: 20 min  Stress: Stress Concern Present (03/25/2023)   [employer] of Occupational Health - Occupational Stress Questionnaire    Feeling of Stress : To some extent  Social Connections: Moderately Integrated (03/25/2023)   Social Connection and Isolation Panel [NHANES]    Frequency of Communication with Friends and Family: Three times a week    Frequency of Social Gatherings with Friends and Family: Once a week    Attends Religious Services: 1 to 4 times per year    Active Member of [employer] or Organizations: No    Attends Banker Meetings: Not on file    Marital Status: Married  Catering manager  Violence: Not At Risk (08/02/2020)   Humiliation, Afraid, Rape, and Kick questionnaire    Fear of Current or Ex-Partner: No    Emotionally Abused: No    Physically Abused: No    Sexually Abused: No    Review of Systems  Constitutional: Negative.  HENT: Negative.    Eyes: Negative.   Respiratory: Negative.    Cardiovascular: Negative.   Gastrointestinal: Negative.   Genitourinary: Negative.   Musculoskeletal:  Positive for back pain and joint pain.  Skin: Negative.   Neurological: Negative.   Endo/Heme/Allergies: Negative.   Psychiatric/Behavioral: Negative.      BP 130/86   Pulse 73   Temp 97.8 F (36.6 C) (Oral)   Ht 5' 8.15" (1.731 m)   Wt 229 lb (103.9 kg)   SpO2 97%   BMI 34.67 kg/m   Physical Exam  Recent Results (from the past 2160 hours)  POCT urinalysis dipstick     Status: Abnormal   Collection Time: 02/16/23  4:04 PM  Result Value Ref Range   Color, UA yellow    Clarity, UA cloudy (A)    Glucose, UA negative mg/dL   Bilirubin, UA negative    Ketones, POC UA trace (5) (A) mg/dL   Spec Grav, UA >=1.610 (A)    Blood, UA large (A)    pH, UA 5.5    Protein Ur, POC =100 (A) mg/dL   Urobilinogen, UA 1.0 E.U./dL   Nitrite, UA Positive (A)    Leukocytes, UA Trace (A)   Urine Culture     Status: Abnormal   Collection Time: 02/16/23  5:12 PM   Specimen: Urine, Clean Catch  Result Value Ref Range   Specimen Description      URINE, CLEAN CATCH Performed at [facility], [address] [place], [place] [address]    Special Requests      NONE Performed at [facility], [address] [place], [place] [address]    Culture >=100,000 COLONIES/mL ESCHERICHIA COLI (A)    Report Status 02/18/2023 FINAL    Organism ID, Bacteria ESCHERICHIA COLI (A)       Susceptibility   Escherichia coli - MIC*    AMPICILLIN <=2 SENSITIVE Sensitive     CEFAZOLIN <=4 SENSITIVE Sensitive     CEFEPIME <=0.12 SENSITIVE Sensitive     CEFTRIAXONE <=0.25 SENSITIVE Sensitive      CIPROFLOXACIN <=0.25 SENSITIVE Sensitive     GENTAMICIN <=1 SENSITIVE Sensitive     IMIPENEM <=0.25 SENSITIVE Sensitive     NITROFURANTOIN <=16 SENSITIVE Sensitive     TRIMETH/SULFA <=20 SENSITIVE Sensitive     AMPICILLIN/SULBACTAM <=2 SENSITIVE Sensitive     PIP/TAZO <=4 SENSITIVE Sensitive ug/mL    * >=100,000 COLONIES/mL ESCHERICHIA COLI    Assessment/Plan: 1. Encounter to establish care - Follow up in November for CPE or sooner if needed  2. Hyperlipidemia, unspecified hyperlipidemia type (Primary) - Continue with Crestor and lifestyle modifications   3. Restless leg syndrome - Does not want to be on medication since her symptoms do not happen often   4. Vitamin D deficiency - Continue with vitamin D  5. Prediabetes - Continue to work on lifestyle modifications   6. Gastroesophageal reflux disease without esophagitis - Continue famotidine   7. Dysuria - Consider referral to Urology  - Urine Culture; Future - Urinalysis; Future  8. Chronic midline low back pain without sciatica - Consider PT  - DG Lumbar Spine Complete; Future  9. Acute pain of left knee - Consider PT vs steroid injection  - DG Knee 1-2 Views Left; Future  10. Colon cancer screening  - Cologuard   Shirline Frees, NP

## 2023-03-27 NOTE — Patient Instructions (Addendum)
 It was great seeing you today   We will follow up with you regarding your lab work and xrays  Please let me know if you need anything   Your physical is due after 11/15/2023

## 2023-03-28 LAB — URINE CULTURE
MICRO NUMBER:: 16244518
SPECIMEN QUALITY:: ADEQUATE

## 2023-03-29 ENCOUNTER — Other Ambulatory Visit: Payer: Self-pay | Admitting: Adult Health

## 2023-03-29 DIAGNOSIS — Z1231 Encounter for screening mammogram for malignant neoplasm of breast: Secondary | ICD-10-CM

## 2023-04-03 NOTE — Addendum Note (Signed)
 Addended by: Waymon Amato R on: 04/03/2023 01:19 PM   Modules accepted: Orders

## 2023-04-04 LAB — COLOGUARD: COLOGUARD: POSITIVE — AB

## 2023-04-05 ENCOUNTER — Other Ambulatory Visit: Payer: Self-pay | Admitting: Adult Health

## 2023-04-05 DIAGNOSIS — R195 Other fecal abnormalities: Secondary | ICD-10-CM

## 2023-04-11 ENCOUNTER — Ambulatory Visit

## 2023-04-17 ENCOUNTER — Ambulatory Visit
Admission: RE | Admit: 2023-04-17 | Discharge: 2023-04-17 | Disposition: A | Discharge status: Home / Self Care | Source: Ambulatory Visit | Attending: Adult Health

## 2023-04-17 DIAGNOSIS — Z1231 Encounter for screening mammogram for malignant neoplasm of breast: Secondary | ICD-10-CM

## 2023-04-18 ENCOUNTER — Telehealth: Payer: Self-pay

## 2023-04-18 ENCOUNTER — Encounter: Payer: Self-pay | Admitting: Orthopedic Surgery

## 2023-04-18 ENCOUNTER — Ambulatory Visit (INDEPENDENT_AMBULATORY_CARE_PROVIDER_SITE_OTHER): Admitting: Orthopedic Surgery

## 2023-04-18 VITALS — Ht 68.25 in | Wt 229.0 lb

## 2023-04-18 DIAGNOSIS — M1712 Unilateral primary osteoarthritis, left knee: Secondary | ICD-10-CM | POA: Diagnosis not present

## 2023-04-18 NOTE — Telephone Encounter (Signed)
Auth needed for gel 

## 2023-04-20 ENCOUNTER — Encounter: Payer: Self-pay | Admitting: Orthopedic Surgery

## 2023-04-20 MED ORDER — METHYLPREDNISOLONE ACETATE 40 MG/ML IJ SUSP
40.0000 mg | INTRAMUSCULAR | Status: AC | PRN
  Administered 2023-04-18: 40 mg via INTRA_ARTICULAR

## 2023-04-20 MED ORDER — LIDOCAINE HCL 1 % IJ SOLN
5.0000 mL | INTRAMUSCULAR | Status: AC | PRN
  Administered 2023-04-18: 5 mL

## 2023-04-20 MED ORDER — BUPIVACAINE HCL 0.25 % IJ SOLN
4.0000 mL | INTRAMUSCULAR | Status: AC | PRN
  Administered 2023-04-18: 4 mL via INTRA_ARTICULAR

## 2023-04-20 NOTE — Progress Notes (Signed)
 Office Visit Note   Patient: Kristina Hunter           Date of Birth: Nov 12, 1955           MRN: [id number] Visit Date: 04/18/2023 Requested by: Alto Atta, NP [address] [place],  [place] [address] PCP: Alto Atta, NP  Subjective: Chief Complaint  Patient presents with   Left Knee - Pain    HPI: Kristina Hunter is a 68 y.o. female who presents to the office reporting left knee pain for several months.  Denies any history of injury.  Has had radiographs at her primary care provider which shows tricompartmental arthritis worse in the medial compartment along with multiple loose bodies in the knee.  She does have some trouble with stairs.  Takes Aleve and she is unsure if it helps.  Golf irritated her knee.  Pain does not wake her from sleep.  She is retired from [employer] job.  Denies any rest pain.  She also denies any definite loose body type symptoms in terms of locking and knifelike pain which comes out of the blue when she is walking.  She reports only 1 episode of swelling..                ROS: All systems reviewed are negative as they relate to the chief complaint within the history of present illness.  Patient denies fevers or chills.  Assessment & Plan: Visit Diagnoses:  1. Arthritis of left knee     Plan: Impression is left knee arthritis with multiple loose bodies.  The loose bodies do not seem to be causing her symptoms.  Her arthritis is symptomatic but not severe enough for any type of surgical intervention.  We will try an injection today into the left knee and get her preapproved for gel shot.  Follow-up after the cortisone wears off for the gel injection.  Follow-Up Instructions: No follow-ups on file.   Orders:  No orders of the defined types were placed in this encounter.  No orders of the defined types were placed in this encounter.     Procedures: Large Joint Inj: L knee on 04/18/2023 6:14 PM Indications: diagnostic evaluation, joint  swelling and pain Details: 18 G 1.5 in needle, superolateral approach  Arthrogram: No  Medications: 5 mL lidocaine  1 %; 40 mg methylPREDNISolone  acetate 40 MG/ML; 4 mL bupivacaine  0.25 % Outcome: tolerated well, no immediate complications Procedure, treatment alternatives, risks and benefits explained, specific risks discussed. Consent was given by the patient. Immediately prior to procedure a time out was called to verify the correct patient, procedure, equipment, support staff and site/side marked as required. Patient was prepped and draped in the usual sterile fashion.       Clinical Data: No additional findings.  Objective: Vital Signs: Ht 5' 8.25" (1.734 m)   Wt 229 lb (103.9 kg)   BMI 34.56 kg/m   Physical Exam:  Constitutional: Patient appears well-developed HEENT:  Head: Normocephalic Eyes:EOM are normal Neck: Normal range of motion Cardiovascular: Normal rate Pulmonary/chest: Effort normal Neurologic: Patient is alert Skin: Skin is warm Psychiatric: Patient has normal mood and affect  Ortho Exam: Ortho exam demonstrates range of motion bilateral knees 5-1 10.  No effusion in the knee.  Collateral and cruciate ligaments are stable.  Mild patellofemoral crepitus with range of motion.  Pedal pulses palpable.  No groin pain with internal or external rotation of either leg.  Specialty Comments:  No specialty comments available.  Imaging: No results found.   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   Hyperlipidemia    Environmental allergies    Past Medical History:  Diagnosis Date   Allergy    Cervical disc disorder    s/p fusion    Hyperlipidemia     Family  History  Problem Relation Age of Onset   Arthritis Mother    Hyperlipidemia Mother    Hypertension Mother    Dementia Mother    Hypertension Father    Diabetes Father    Hyperlipidemia Father    Arthritis Father    Heart attack Paternal Grandfather        In 35s   Breast cancer Neg Hx     Past Surgical History:  Procedure Laterality Date   ABDOMINAL HYSTERECTOMY  1990   with left salpingooopherectomy   CERVICAL DISC SURGERY  2000   TUBAL LIGATION Bilateral 1986   BTL   Social History   Occupational History   Not on file  Tobacco Use   Smoking status: Never   Smokeless tobacco: Never  Vaping Use   Vaping status: Never Used  Substance and Sexual Activity   Alcohol use: No   Drug use: No   Sexual activity: Yes    Partners: Male    Birth control/protection: Post-menopausal

## 2023-04-26 ENCOUNTER — Ambulatory Visit (AMBULATORY_SURGERY_CENTER)

## 2023-04-26 VITALS — Ht 68.0 in | Wt 220.0 lb

## 2023-04-26 DIAGNOSIS — Z1211 Encounter for screening for malignant neoplasm of colon: Secondary | ICD-10-CM

## 2023-04-26 MED ORDER — NA SULFATE-K SULFATE-MG SULF 17.5-3.13-1.6 GM/177ML PO SOLN: 1.0000 | Freq: Once | ORAL | 0 refills | Status: AC

## 2023-04-26 NOTE — Progress Notes (Signed)
 Pre visit completed via phone call; Patient verified name, DOB, and address; No egg or soy allergy known to patient;  No issues known to pt with past sedation with any surgeries or procedures; Patient denies ever being told they had issues or difficulty with intubation;  No FH of Malignant Hyperthermia; Pt is not on diet pills; Pt is not on home 02;  Pt is not on blood thinners;  Pt denies issues with constipation;  No A fib or A flutter; Have any cardiac testing pending--NO Insurance verified during PV appt--- [employer] Medicare Pt can ambulate without assistance;  Pt denies use of chewing tobacco; Discussed diabetic/weight loss medication holds; Discussed NSAID holds; Checked BMI to be less than 50; Pt instructed to use Singlecare.com or GoodRx for a price reduction on prep;  Patient's chart reviewed by Rogena Class CNRA prior to previsit and patient appropriate for the [facility];  Pre visit completed and red dot placed by patient's name on their procedure day (on provider's schedule);  Instructions sent to MyChart as well as printed and mailed to the patient per her request;

## 2023-04-30 NOTE — Telephone Encounter (Signed)
 VOB submitted for Monovisc, left knee

## 2023-05-08 ENCOUNTER — Encounter: Payer: Self-pay | Admitting: Gastroenterology

## 2023-05-11 ENCOUNTER — Encounter (HOSPITAL_COMMUNITY): Payer: Self-pay

## 2023-05-18 ENCOUNTER — Encounter: Payer: Self-pay | Admitting: Gastroenterology

## 2023-05-18 ENCOUNTER — Ambulatory Visit: Admitting: Gastroenterology

## 2023-05-18 VITALS — BP 159/90 | HR 63 | Temp 97.2°F | Resp 14 | Ht 68.0 in | Wt 220.0 lb

## 2023-05-18 DIAGNOSIS — D122 Benign neoplasm of ascending colon: Secondary | ICD-10-CM | POA: Diagnosis not present

## 2023-05-18 DIAGNOSIS — K635 Polyp of colon: Secondary | ICD-10-CM | POA: Diagnosis not present

## 2023-05-18 DIAGNOSIS — Z1211 Encounter for screening for malignant neoplasm of colon: Secondary | ICD-10-CM | POA: Diagnosis not present

## 2023-05-18 DIAGNOSIS — K5730 Diverticulosis of large intestine without perforation or abscess without bleeding: Secondary | ICD-10-CM | POA: Diagnosis not present

## 2023-05-18 DIAGNOSIS — R195 Other fecal abnormalities: Secondary | ICD-10-CM | POA: Diagnosis not present

## 2023-05-18 MED ORDER — SODIUM CHLORIDE 0.9 % IV SOLN
500.0000 mL | Freq: Once | INTRAVENOUS | Status: DC
Start: 2023-05-18 — End: 2023-05-18

## 2023-05-18 NOTE — Progress Notes (Signed)
 Pt sedate, gd SR's, VSS, report to RN

## 2023-05-18 NOTE — Op Note (Signed)
 [facility] Patient Name: Kristina Hunter Procedure Date: 05/18/2023 10:59 AM MRN: [id number] Endoscopist: Ace Abu L. Dominic Friendly , MD, [id number] Age: 68 Referring MD:  Date of Birth: 11-Jul-1955 Gender: Female Account #: [id number] Procedure:                Colonoscopy Indications:              Positive (recent) Cologuard test                           negative cologuard in 2022 and 2019 Medicines:                Monitored Anesthesia Care Procedure:                Pre-Anesthesia Assessment:                           - Prior to the procedure, a History and Physical                            was performed, and patient medications and                            allergies were reviewed. The patient's tolerance of                            previous anesthesia was also reviewed. The risks                            and benefits of the procedure and the sedation                            options and risks were discussed with the patient.                            All questions were answered, and informed consent                            was obtained. Prior Anticoagulants: The patient has                            taken no anticoagulant or antiplatelet agents. ASA                            Grade Assessment: II - A patient with mild systemic                            disease. After reviewing the risks and benefits,                            the patient was deemed in satisfactory condition to                            undergo the procedure.  After obtaining informed consent, the colonoscope                            was passed under direct vision. Throughout the                            procedure, the patient's blood pressure, pulse, and                            oxygen saturations were monitored continuously. The                            Olympus Scope SN: [id number] was introduced through                            the anus and advanced to the the cecum,  identified                            by appendiceal orifice and ileocecal valve. The                            colonoscopy was somewhat difficult due to a                            redundant colon. Successful completion of the                            procedure was aided by using manual pressure and                            straightening and shortening the scope to obtain                            bowel loop reduction. The patient tolerated the                            procedure well. The quality of the bowel                            preparation was good. The ileocecal valve,                            appendiceal orifice, and rectum were photographed. Scope In: 11:06:06 AM Scope Out: 11:24:15 AM Scope Withdrawal Time: 0 hours 12 minutes 1 second  Total Procedure Duration: 0 hours 18 minutes 9 seconds  Findings:                 The perianal and digital rectal examinations were                            normal.                           Repeat examination of right colon under NBI  performed.                           A 10 mm polyp was found in the ascending colon. The                            polyp was mucous-capped and semi-sessile. The polyp                            was removed with a piecemeal technique using a cold                            snare. Resection and retrieval were complete.                           Multiple diverticula were found in the left colon.                           The exam was otherwise without abnormality on                            direct and retroflexion views. Complications:            No immediate complications. Estimated Blood Loss:     Estimated blood loss was minimal. Impression:               - One 10 mm polyp in the ascending colon, removed                            piecemeal using a cold snare. Resected and                            retrieved.                           - Diverticulosis in the left  colon.                           - The examination was otherwise normal on direct                            and retroflexion views. Recommendation:           - Patient has a contact number available for                            emergencies. The signs and symptoms of potential                            delayed complications were discussed with the                            patient. Return to normal activities tomorrow.                            Written discharge instructions were  provided to the                            patient.                           - Resume previous diet.                           - Continue present medications.                           - Await pathology results.                           - Repeat colonoscopy is recommended for                            surveillance. The colonoscopy date will be                            determined after pathology results from today's                            exam become available for review. Milady Fleener L. Dominic Friendly, MD 05/18/2023 11:28:10 AM This report has been signed electronically.

## 2023-05-18 NOTE — Progress Notes (Signed)
 Pt's states no medical or surgical changes since previsit or office visit.

## 2023-05-18 NOTE — Progress Notes (Signed)
 Called to room to assist during endoscopic procedure.  Patient ID and intended procedure confirmed with present staff. Received instructions for my participation in the procedure from the performing physician.

## 2023-05-18 NOTE — Progress Notes (Signed)
 History and Physical:  This patient presents for endoscopic testing for: Encounter Diagnosis  Name Primary?   Positive colorectal cancer screening using Cologuard test Yes    Colonoscopy today for positive cologuard.  Negative cologuard in 2022 and 2019 Patient denies chronic abdominal pain, rectal bleeding, constipation or diarrhea. No Fam Hx CRC   Patient is otherwise without complaints or active issues today.   Past Medical History: Past Medical History:  Diagnosis Date   Allergy    Cervical disc disorder    s/p fusion    Hyperlipidemia      Past Surgical History: Past Surgical History:  Procedure Laterality Date   ABDOMINAL HYSTERECTOMY  1990   with left salpingooopherectomy   CERVICAL DISC SURGERY  2000   TUBAL LIGATION Bilateral 1986   BTL    Allergies: No Known Allergies  Outpatient Meds: Current Outpatient Medications  Medication Sig Dispense Refill   BABY ASPIRIN PO Take 81 mg by mouth daily.     Calcium -Vitamin D  (CALTRATE 600 PLUS-VIT D PO) Take by mouth daily.     CHOLECALCIFEROL PO Take 2,000 Units by mouth daily.     Cyanocobalamin (B-12) 1000 MCG TABS Take 1,000 mcg by mouth.     famotidine (PEPCID) 10 MG tablet Take 10 mg by mouth daily.     ketoconazole (NIZORAL) 2 % shampoo      Multiple Vitamins-Minerals (MULTIVITAMIN PO) Take by mouth daily.     Omega-3 Fatty Acids (FISH OIL PO) Take by mouth daily.     rosuvastatin  (CRESTOR ) 10 MG tablet Take     1 tablet      Daily      for Cholesterol 90 tablet 3   vitamin C (ASCORBIC ACID) 500 MG tablet Take 500 mg by mouth daily.     Zinc 25 MG TABS Take 1 tablet by mouth daily.     Current Facility-Administered Medications  Medication Dose Route Frequency Provider Last Rate Last Admin   0.9 %  sodium chloride infusion  500 mL Intravenous Once Danis, Edris Schneck L III, MD          ___________________________________________________________________ Objective   Exam:  BP (!) 150/84   Pulse 70   Temp  (!) 97.2 F (36.2 C)   Resp 19   Ht 5\' 8"  (1.727 m)   Wt 220 lb (99.8 kg)   SpO2 97%   BMI 33.45 kg/m   CV: regular , S1/S2 Resp: clear to auscultation bilaterally, normal RR and effort noted GI: soft, no tenderness, with active bowel sounds.   Assessment: Encounter Diagnosis  Name Primary?   Positive colorectal cancer screening using Cologuard test Yes     Plan: Colonoscopy   The benefits and risks of the planned procedure(s) were described in detail with the patient or (when appropriate) their health care proxy.  Risks were outlined as including, but not limited to, bleeding, infection, perforation, adverse medication reaction leading to cardiac or pulmonary decompensation, pancreatitis (if ERCP).  The limitation of incomplete mucosal visualization was also discussed.  No guarantees or warranties were given.  The patient is appropriate for an endoscopic procedure in the ambulatory setting.   - Lorella Roles, MD

## 2023-05-18 NOTE — Patient Instructions (Signed)

## 2023-05-21 ENCOUNTER — Telehealth: Payer: Self-pay

## 2023-05-21 ENCOUNTER — Ambulatory Visit: Payer: Medicare HMO | Admitting: Nurse Practitioner

## 2023-05-21 NOTE — Telephone Encounter (Signed)
 Left message

## 2023-05-22 LAB — SURGICAL PATHOLOGY

## 2023-05-23 ENCOUNTER — Ambulatory Visit: Payer: Self-pay | Admitting: Gastroenterology

## 2023-05-30 ENCOUNTER — Encounter: Payer: Self-pay | Admitting: Orthopedic Surgery

## 2023-05-30 ENCOUNTER — Ambulatory Visit: Admitting: Orthopedic Surgery

## 2023-05-30 DIAGNOSIS — M1712 Unilateral primary osteoarthritis, left knee: Secondary | ICD-10-CM

## 2023-05-30 MED ORDER — HYALURONAN 88 MG/4ML IX SOSY
88.0000 mg | PREFILLED_SYRINGE | INTRA_ARTICULAR | Status: AC | PRN
Start: 2023-05-30 — End: 2023-05-30
  Administered 2023-05-30: 88 mg via INTRA_ARTICULAR

## 2023-05-30 MED ORDER — LIDOCAINE HCL 1 % IJ SOLN
5.0000 mL | INTRAMUSCULAR | Status: AC | PRN
  Administered 2023-05-30: 5 mL

## 2023-05-30 NOTE — Progress Notes (Signed)
   Procedure Note  Patient: Kristina Hunter             Date of Birth: [birth year]           MRN: [id number]             Visit Date: 05/30/2023  Procedures: Visit Diagnoses:  1. Arthritis of left knee     Large Joint Inj: L knee on 05/30/2023 8:03 AM Indications: pain, joint swelling and diagnostic evaluation Details: 18 G 1.5 in needle, superolateral approach  Arthrogram: No  Medications: 5 mL lidocaine  1 %; 88 mg Hyaluronan 88 MG/4ML Outcome: tolerated well, no immediate complications Procedure, treatment alternatives, risks and benefits explained, specific risks discussed. Consent was given by the patient. Immediately prior to procedure a time out was called to verify the correct patient, procedure, equipment, support staff and site/side marked as required. Patient was prepped and draped in the usual sterile fashion.     Lot #[id number].  Cortisone did help the left knee some but it did not last more than a couple of weeks.

## 2023-09-04 ENCOUNTER — Ambulatory Visit: Admitting: Adult Health

## 2023-09-04 ENCOUNTER — Other Ambulatory Visit: Payer: Self-pay

## 2023-09-04 DIAGNOSIS — E782 Mixed hyperlipidemia: Secondary | ICD-10-CM

## 2023-09-04 MED ORDER — ROSUVASTATIN CALCIUM 10 MG PO TABS: ORAL_TABLET | ORAL | 3 refills | Status: AC

## 2023-09-28 ENCOUNTER — Encounter: Payer: Self-pay | Admitting: Orthopedic Surgery

## 2023-09-28 ENCOUNTER — Ambulatory Visit: Admitting: Orthopedic Surgery

## 2023-09-28 DIAGNOSIS — M1712 Unilateral primary osteoarthritis, left knee: Secondary | ICD-10-CM

## 2023-09-28 MED ORDER — HYALURONAN 88 MG/4ML IX SOSY
88.0000 mg | PREFILLED_SYRINGE | INTRA_ARTICULAR | Status: AC | PRN
  Administered 2023-09-28: 88 mg via INTRA_ARTICULAR

## 2023-09-28 MED ORDER — LIDOCAINE HCL 1 % IJ SOLN
5.0000 mL | INTRAMUSCULAR | Status: AC | PRN
  Administered 2023-09-28: 5 mL

## 2023-09-28 NOTE — Progress Notes (Signed)
   Procedure Note  Patient: Kristina Hunter             Date of Birth: 26-Aug-1955           MRN: [id number]             Visit Date: 09/28/2023  Procedures: Visit Diagnoses:  1. Arthritis of left knee     Large Joint Inj: L knee on 09/28/2023 8:47 AM Indications: pain, joint swelling and diagnostic evaluation Details: 18 G 1.5 in needle, superolateral approach  Arthrogram: No  Medications: 5 mL lidocaine  1 %; 88 mg Hyaluronan 88 MG/4ML Outcome: tolerated well, no immediate complications Procedure, treatment alternatives, risks and benefits explained, specific risks discussed. Consent was given by the patient. Immediately prior to procedure a time out was called to verify the correct patient, procedure, equipment, support staff and site/side marked as required. Patient was prepped and draped in the usual sterile fashion.

## 2023-09-28 NOTE — Addendum Note (Signed)
 Addended by: Rashaad Hallstrom on: 09/28/2023 10:04 AM   Modules accepted: Orders

## 2023-10-24 DIAGNOSIS — Z0100 Encounter for examination of eyes and vision without abnormal findings: Secondary | ICD-10-CM | POA: Diagnosis not present

## 2023-10-24 DIAGNOSIS — H524 Presbyopia: Secondary | ICD-10-CM | POA: Diagnosis not present

## 2023-11-05 ENCOUNTER — Encounter: Payer: Self-pay | Admitting: Radiology

## 2023-11-14 ENCOUNTER — Encounter: Payer: Medicare HMO | Admitting: Nurse Practitioner

## 2023-11-15 ENCOUNTER — Encounter: Payer: Self-pay | Admitting: Adult Health

## 2023-11-15 ENCOUNTER — Ambulatory Visit

## 2023-11-15 ENCOUNTER — Encounter: Admitting: Adult Health

## 2023-11-15 ENCOUNTER — Ambulatory Visit: Admitting: Adult Health

## 2023-11-15 ENCOUNTER — Ambulatory Visit (INDEPENDENT_AMBULATORY_CARE_PROVIDER_SITE_OTHER)

## 2023-11-15 VITALS — BP 128/86 | HR 71 | Temp 97.9°F | Ht 68.0 in | Wt 228.0 lb

## 2023-11-15 VITALS — Ht 68.0 in | Wt 220.0 lb

## 2023-11-15 DIAGNOSIS — Z23 Encounter for immunization: Secondary | ICD-10-CM

## 2023-11-15 DIAGNOSIS — Z0000 Encounter for general adult medical examination without abnormal findings: Secondary | ICD-10-CM

## 2023-11-15 DIAGNOSIS — K219 Gastro-esophageal reflux disease without esophagitis: Secondary | ICD-10-CM | POA: Diagnosis not present

## 2023-11-15 DIAGNOSIS — R7303 Prediabetes: Secondary | ICD-10-CM | POA: Diagnosis not present

## 2023-11-15 DIAGNOSIS — M545 Low back pain, unspecified: Secondary | ICD-10-CM

## 2023-11-15 DIAGNOSIS — G2581 Restless legs syndrome: Secondary | ICD-10-CM | POA: Diagnosis not present

## 2023-11-15 DIAGNOSIS — E559 Vitamin D deficiency, unspecified: Secondary | ICD-10-CM

## 2023-11-15 DIAGNOSIS — E782 Mixed hyperlipidemia: Secondary | ICD-10-CM | POA: Diagnosis not present

## 2023-11-15 DIAGNOSIS — G8929 Other chronic pain: Secondary | ICD-10-CM | POA: Diagnosis not present

## 2023-11-15 LAB — COMPREHENSIVE METABOLIC PANEL WITH GFR
ALT: 20 U/L (ref 0–35)
AST: 20 U/L (ref 0–37)
Albumin: 4.3 g/dL (ref 3.5–5.2)
Alkaline Phosphatase: 73 U/L (ref 39–117)
BUN: 27 mg/dL — ABNORMAL HIGH (ref 6–23)
CO2: 27 meq/L (ref 19–32)
Calcium: 9.4 mg/dL (ref 8.4–10.5)
Chloride: 102 meq/L (ref 96–112)
Creatinine, Ser: 1.08 mg/dL (ref 0.40–1.20)
GFR: 52.19 mL/min — ABNORMAL LOW (ref >60.00)
Glucose, Bld: 107 mg/dL — ABNORMAL HIGH (ref 70–99)
Potassium: 4 meq/L (ref 3.5–5.1)
Sodium: 138 meq/L (ref 135–145)
Total Bilirubin: 0.5 mg/dL (ref 0.2–1.2)
Total Protein: 7.3 g/dL (ref 6.0–8.3)

## 2023-11-15 LAB — LIPID PANEL
Cholesterol: 156 mg/dL (ref 0–200)
HDL: 43 mg/dL (ref >39.00)
LDL Cholesterol: 75 mg/dL (ref 0–99)
NonHDL: 113.16
Total CHOL/HDL Ratio: 4
Triglycerides: 189 mg/dL — ABNORMAL HIGH (ref 0.0–149.0)
VLDL: 37.8 mg/dL (ref 0.0–40.0)

## 2023-11-15 LAB — CBC WITH DIFFERENTIAL/PLATELET
Basophils Absolute: 0.1 K/uL (ref 0.0–0.1)
Basophils Relative: 0.7 % (ref 0.0–3.0)
Eosinophils Absolute: 0.3 K/uL (ref 0.0–0.7)
Eosinophils Relative: 3.9 % (ref 0.0–5.0)
HCT: 41.7 % (ref 36.0–46.0)
Hemoglobin: 14.2 g/dL (ref 12.0–15.0)
Lymphocytes Relative: 22.4 % (ref 12.0–46.0)
Lymphs Abs: 1.8 K/uL (ref 0.7–4.0)
MCHC: 34.1 g/dL (ref 30.0–36.0)
MCV: 93.3 fl (ref 78.0–100.0)
Monocytes Absolute: 0.5 K/uL (ref 0.1–1.0)
Monocytes Relative: 6.6 % (ref 3.0–12.0)
Neutro Abs: 5.4 K/uL (ref 1.4–7.7)
Neutrophils Relative %: 66.4 % (ref 43.0–77.0)
Platelets: 273 K/uL (ref 150.0–400.0)
RBC: 4.47 Mil/uL (ref 3.87–5.11)
RDW: 12.4 % (ref 11.5–15.5)
WBC: 8.1 K/uL (ref 4.0–10.5)

## 2023-11-15 LAB — HEMOGLOBIN A1C: Hgb A1c MFr Bld: 6 % (ref 4.6–6.5)

## 2023-11-15 LAB — VITAMIN D 25 HYDROXY (VIT D DEFICIENCY, FRACTURES): VITD: 46.22 ng/mL (ref 30.00–100.00)

## 2023-11-15 LAB — TSH: TSH: 1.53 u[IU]/mL (ref 0.35–5.50)

## 2023-11-15 NOTE — Progress Notes (Signed)
 Subjective:    Patient ID: Kristina Hunter, female    DOB: 10/25/1955, 68 y.o.   MRN: [id number]  HPI   Patient presents for yearly preventative medicine examination. She is a pleasant 68 year old female who  has a past medical history of Allergy, Cervical disc disorder, and Hyperlipidemia.  Hyperlipidemia - managed with Crestor  10 mg daily. She denies myalgia or fatigue  Lab Results  Component Value Date   CHOL 177 11/14/2022   HDL 45 (L) 11/14/2022   LDLCALC 93 11/14/2022   TRIG 272 (H) 11/14/2022   CHOLHDL 3.9 11/14/2022    Restless Leg Syndrome - her symptoms are intermittent and she does not wish to pursue medication.   Vitamin D  Deficiency - takes OTC vitamin D   Last vitamin D  Last vitamin D  Lab Results  Component Value Date   VD25OH 47 11/14/2022    Prediabetes - this goes back to 2015. She is not currently on medication. She is working on weight loss through lifestyle modifications.  Lab Results  Component Value Date   HGBA1C 6.0 (H) 11/14/2022   GERD - takes Famotidine 10 mg daily. This works for the most part but will still have symptoms with spicy foods.   Chronic Low Back Pain -this is an ongoing issue.  She reports that she has low back pain that is worse when standing for long periods of time.  Sitting instantly alleviates her back pain.  She denies any issues with sciatica.   All immunizations and health maintenance protocols were reviewed with the patient and needed orders were placed.  Appropriate screening laboratory values were ordered for the patient including screening of hyperlipidemia, renal function and hepatic function.  Medication reconciliation,  past medical history, social history, problem list and allergies were reviewed in detail with the patient  Goals were established with regard to weight loss, exercise, and  diet in compliance with medications  End of life planning was discussed.  She is up to date on routine colon cancer  screening, in March she had a positive cologuard test and had a colonoscopy in May which showed one 10 mm polyp in the ascending colon   Review of Systems  Constitutional: Negative.   HENT: Negative.    Eyes: Negative.   Respiratory: Negative.    Cardiovascular: Negative.   Gastrointestinal: Negative.   Endocrine: Negative.   Genitourinary: Negative.   Musculoskeletal:  Positive for arthralgias and back pain.  Skin: Negative.   Allergic/Immunologic: Negative.   Neurological: Negative.   Hematological: Negative.   Psychiatric/Behavioral: Negative.     Past Medical History:  Diagnosis Date   Allergy    Cervical disc disorder    s/p fusion    Hyperlipidemia     Social History   Socioeconomic History   Marital status: Married    Spouse name: Not on file   Number of children: 3   Years of education: Not on file   Highest education level: 12th grade  Occupational History   Not on file  Tobacco Use   Smoking status: Never   Smokeless tobacco: Never  Vaping Use   Vaping status: Never Used  Substance and Sexual Activity   Alcohol use: No   Drug use: No   Sexual activity: Yes    Partners: Male    Birth control/protection: Post-menopausal  Other Topics Concern   Not on file  Social History Narrative   Married   Social Drivers of Corporate Investment Banker Strain: Low  Risk  (11/15/2023)   Overall Financial Resource Strain (CARDIA)    Difficulty of Paying Living Expenses: Not hard at all  Food Insecurity: No Food Insecurity (11/15/2023)   Hunger Vital Sign    Worried About Running Out of Food in the Last Year: Never true    Ran Out of Food in the Last Year: Never true  Transportation Needs: No Transportation Needs (11/15/2023)   PRAPARE - Administrator, Civil Service (Medical): No    Lack of Transportation (Non-Medical): No  Physical Activity: Insufficiently Active (11/15/2023)   Exercise Vital Sign    Days of Exercise per Week: 1 day    Minutes of  Exercise per Session: 20 min  Stress: No Stress Concern Present (11/15/2023)   [employer] of Occupational Health - Occupational Stress Questionnaire    Feeling of Stress: Not at all  Social Connections: Moderately Integrated (11/15/2023)   Social Connection and Isolation Panel    Frequency of Communication with Friends and Family: Twice a week    Frequency of Social Gatherings with Friends and Family: Twice a week    Attends Religious Services: 1 to 4 times per year    Active Member of [employer] or Organizations: No    Attends Engineer, Structural: Not on file    Marital Status: Married  Recent Concern: Social Connections - Moderately Isolated (09/03/2023)   Social Connection and Isolation Panel    Frequency of Communication with Friends and Family: Once a week    Frequency of Social Gatherings with Friends and Family: Once a week    Attends Religious Services: 1 to 4 times per year    Active Member of [employer] or Organizations: No    Attends Engineer, Structural: Not on file    Marital Status: Married  Catering Manager Violence: Not At Risk (11/15/2023)   Humiliation, Afraid, Rape, and Kick questionnaire    Fear of Current or Ex-Partner: No    Emotionally Abused: No    Physically Abused: No    Sexually Abused: No    Past Surgical History:  Procedure Laterality Date   ABDOMINAL HYSTERECTOMY  1990   with left salpingooopherectomy   CERVICAL DISC SURGERY  2000   TUBAL LIGATION Bilateral 1986   BTL    Family History  Problem Relation Age of Onset   Arthritis Mother    Hyperlipidemia Mother    Hypertension Mother    Dementia Mother    Hypertension Father    Diabetes Father    Hyperlipidemia Father    Arthritis Father    Colon polyps Sister 6   Heart attack Paternal Grandfather        In 69s   Breast cancer Neg Hx    Colon cancer Neg Hx    Esophageal cancer Neg Hx    Rectal cancer Neg Hx    Stomach cancer Neg Hx     No Known Allergies  Current  Outpatient Medications on File Prior to Visit  Medication Sig Dispense Refill   BABY ASPIRIN PO Take 81 mg by mouth daily.     Calcium -Vitamin D  (CALTRATE 600 PLUS-VIT D PO) Take by mouth daily.     CHOLECALCIFEROL PO Take 2,000 Units by mouth daily.     Cyanocobalamin (B-12) 1000 MCG TABS Take 1,000 mcg by mouth.     famotidine (PEPCID) 10 MG tablet Take 10 mg by mouth daily.     ketoconazole (NIZORAL) 2 % shampoo      Multiple  Vitamins-Minerals (MULTIVITAMIN PO) Take by mouth daily.     Omega-3 Fatty Acids (FISH OIL PO) Take by mouth daily.     rosuvastatin  (CRESTOR ) 10 MG tablet Take     1 tablet      Daily      for Cholesterol 90 tablet 3   vitamin C (ASCORBIC ACID) 500 MG tablet Take 500 mg by mouth daily.     Zinc 25 MG TABS Take 1 tablet by mouth daily.     No current facility-administered medications on file prior to visit.    BP 128/86   Pulse 71   Temp 97.9 F (36.6 C) (Oral)   Ht 5' 8 (1.727 m)   Wt 228 lb (103.4 kg)   SpO2 97%   BMI 34.67 kg/m        Objective:   Physical Exam Vitals and nursing note reviewed.  Constitutional:      General: She is not in acute distress.    Appearance: Normal appearance. She is not ill-appearing.  HENT:     Head: Normocephalic and atraumatic.     Right Ear: Tympanic membrane, ear canal and external ear normal. There is no impacted cerumen.     Left Ear: Tympanic membrane, ear canal and external ear normal. There is no impacted cerumen.     Nose: Nose normal. No congestion or rhinorrhea.     Mouth/Throat:     Mouth: Mucous membranes are moist.     Pharynx: Oropharynx is clear.  Eyes:     Extraocular Movements: Extraocular movements intact.     Conjunctiva/sclera: Conjunctivae normal.     Pupils: Pupils are equal, round, and reactive to light.  Neck:     Vascular: No carotid bruit.  Cardiovascular:     Rate and Rhythm: Normal rate and regular rhythm.     Pulses: Normal pulses.     Heart sounds: No murmur heard.    No  friction rub. No gallop.  Pulmonary:     Effort: Pulmonary effort is normal.     Breath sounds: Normal breath sounds.  Abdominal:     General: Abdomen is flat. Bowel sounds are normal. There is no distension.     Palpations: Abdomen is soft. There is no mass.     Tenderness: There is no abdominal tenderness. There is no guarding or rebound.     Hernia: No hernia is present.  Musculoskeletal:        General: Normal range of motion.     Cervical back: Normal range of motion and neck supple.  Lymphadenopathy:     Cervical: No cervical adenopathy.  Skin:    General: Skin is warm and dry.     Capillary Refill: Capillary refill takes less than 2 seconds.  Neurological:     General: No focal deficit present.     Mental Status: She is alert and oriented to person, place, and time.  Psychiatric:        Mood and Affect: Mood normal.        Behavior: Behavior normal.        Thought Content: Thought content normal.        Judgment: Judgment normal.       Assessment & Plan:  1. Routine general medical examination at a health care facility Today patient counseled on age appropriate routine health concerns for screening and prevention, each reviewed and up to date or declined. Immunizations reviewed and up to date or declined. Labs ordered and reviewed. Risk  factors for depression reviewed and negative. Hearing function and visual acuity are intact. ADLs screened and addressed as needed. Functional ability and level of safety reviewed and appropriate. Education, counseling and referrals performed based on assessed risks today. Patient provided with a copy of personalized plan for preventive services. - Work on weight loss through lifestyle modifications  - Follow up in one year or sooner if needed  2. Need for influenza vaccination  Flu vaccine HIGH DOSE PF(Fluzone Trivalent)  3. Need for pneumococcal vaccine  - Pneumococcal conjugate vaccine 20-valent (Prevnar 20)  4. Hyperlipidemia, mixed  (Primary) - Consider increase in statin  - CBC with Differential/Platelet; Future - Comprehensive metabolic panel with GFR; Future - Lipid panel; Future - TSH; Future  5. Restless leg syndrome - Will hold off on treatment for the time being  - CBC with Differential/Platelet; Future - Comprehensive metabolic panel with GFR; Future - Lipid panel; Future - TSH; Future  6. Vitamin D  deficiency  - VITAMIN D  25 Hydroxy (Vit-D Deficiency, Fractures); Future  7. Prediabetes - Consider Metformin  - CBC with Differential/Platelet; Future - Comprehensive metabolic panel with GFR; Future - Lipid panel; Future - TSH; Future - Hemoglobin A1c; Future  8. Gastroesophageal reflux disease without esophagitis - Continue PPI  - CBC with Differential/Platelet; Future - Comprehensive metabolic panel with GFR; Future - Lipid panel; Future - TSH; Future  9. Chronic midline low back pain without sciatica  - Ambulatory referral to Physical Therapy  Aerilynn Goin, NP

## 2023-11-15 NOTE — Progress Notes (Signed)
 Chief Complaint  Patient presents with   Medicare Wellness     Subjective:   Kristina Hunter is a 68 y.o. female who presents for a Medicare Annual Wellness Visit.  I connected with  Kristina Hunter on 11/15/23 by a audio enabled telemedicine application and verified that I am speaking with the correct person using two identifiers.  Patient Location: Home  Provider Location: Office/Clinic  Persons Participating in Visit: Patient.  I discussed the limitations of evaluation and management by telemedicine. The patient expressed understanding and agreed to proceed.  Vital Signs: Because this visit was a virtual/telehealth visit, some criteria may be missing or patient reported. Any vitals not documented were not able to be obtained and vitals that have been documented are patient reported.   Allergies (verified) Patient has no known allergies.   History: Past Medical History:  Diagnosis Date   Allergy    Cervical disc disorder    s/p fusion    Hyperlipidemia    Past Surgical History:  Procedure Laterality Date   ABDOMINAL HYSTERECTOMY  1990   with left salpingooopherectomy   CERVICAL DISC SURGERY  2000   TUBAL LIGATION Bilateral 1986   BTL   Family History  Problem Relation Age of Onset   Arthritis Mother    Hyperlipidemia Mother    Hypertension Mother    Dementia Mother    Hypertension Father    Diabetes Father    Hyperlipidemia Father    Arthritis Father    Colon polyps Sister 44   Heart attack Paternal Grandfather        In 39s   Breast cancer Neg Hx    Colon cancer Neg Hx    Esophageal cancer Neg Hx    Rectal cancer Neg Hx    Stomach cancer Neg Hx    Social History   Occupational History   Not on file  Tobacco Use   Smoking status: Never   Smokeless tobacco: Never  Vaping Use   Vaping status: Never Used  Substance and Sexual Activity   Alcohol use: No   Drug use: No   Sexual activity: Yes    Partners: Male    Birth control/protection:  Post-menopausal   Tobacco Counseling Counseling given: Not Answered  SDOH Screenings   Food Insecurity: No Food Insecurity (11/15/2023)  Housing: Unknown (11/15/2023)  Transportation Needs: No Transportation Needs (11/15/2023)  Utilities: Not At Risk (11/15/2023)  Depression (PHQ2-9): Low Risk  (11/15/2023)  Financial Resource Strain: Low Risk  (11/15/2023)  Physical Activity: Insufficiently Active (11/15/2023)  Social Connections: Moderately Integrated (11/15/2023)  Recent Concern: Social Connections - Moderately Isolated (09/03/2023)  Stress: No Stress Concern Present (11/15/2023)  Tobacco Use: Low Risk  (11/15/2023)  Health Literacy: Adequate Health Literacy (11/15/2023)   See flowsheets for full screening details  Depression Screen PHQ 2 & 9 Depression Scale- Over the past 2 weeks, how often have you been bothered by any of the following problems? Little interest or pleasure in doing things: 0 Feeling down, depressed, or hopeless (PHQ Adolescent also includes...irritable): 0 PHQ-2 Total Score: 0 Trouble falling or staying asleep, or sleeping too much: 0 Feeling tired or having little energy: 0 Poor appetite or overeating (PHQ Adolescent also includes...weight loss): 0 Feeling bad about yourself - or that you are a failure or have let yourself or your family down: 0 Trouble concentrating on things, such as reading the newspaper or watching television (PHQ Adolescent also includes...like school work): 0 Moving or speaking so slowly that  other people could have noticed. Or the opposite - being so fidgety or restless that you have been moving around a lot more than usual: 0 Thoughts that you would be better off dead, or of hurting yourself in some way: 0 PHQ-9 Total Score: 0 If you checked off any problems, how difficult have these problems made it for you to do your work, take care of things at home, or get along with other people?: Not difficult at all  Depression  Treatment Depression Interventions/Treatment : EYV7-0 Score <4 Follow-up Not Indicated     Goals Addressed               This Visit's Progress     Patient Stated (pt-stated)        Patient stated she plans to continue stretching her limbs and restart exercising        Visit info / Clinical Intake: Medicare Wellness Visit Type:: Subsequent Annual Wellness Visit Persons participating in visit:: patient Medicare Wellness Visit Mode:: Telephone If telephone:: video declined Because this visit was a virtual/telehealth visit:: vitals recorded from last visit If Telephone or Video please confirm:: I connected with the patient using audio enabled telemedicine application and verified that I am speaking with the correct person using two identifiers; I discussed the limitations of evaluation and management by telemedicine; The patient expressed understanding and agreed to proceed Patient Location:: Home Provider Location:: Office Information given by:: patient Interpreter Needed?: No Pre-visit prep was completed: yes AWV questionnaire completed by patient prior to visit?: no Living arrangements:: lives with spouse/significant other Patient's Overall Health Status Rating: good Typical amount of pain: none Does pain affect daily life?: no Are you currently prescribed opioids?: no  Dietary Habits and Nutritional Risks How many meals a day?: 3 Eats fruit and vegetables daily?: yes Most meals are obtained by: preparing own meals; eating out In the last 2 weeks, have you had any of the following?: none Diabetic:: no  Functional Status Activities of Daily Living (to include ambulation/medication): Independent Ambulation: Independent with device- listed below Home Assistive Devices/Equipment: Eyeglasses Medication Administration: Independent Home Management: Independent Primary transportation is: driving Concerns about vision?: no *vision screening is required for WTM* Concerns  about hearing?: no  Fall Screening Falls in the past year?: 0 Number of falls in past year: 0 Was there an injury with Fall?: 0 Fall Risk Category Calculator: 0 Patient Fall Risk Level: Low Fall Risk  Fall Risk Patient at Risk for Falls Due to: No Fall Risks Fall risk Follow up: Falls evaluation completed; Falls prevention discussed  Home and Transportation Safety: All rugs have non-skid backing?: yes All stairs or steps have railings?: yes (outside) Grab bars in the bathtub or shower?: (!) no Have non-skid surface in bathtub or shower?: yes Good home lighting?: yes Regular seat belt use?: yes Hospital stays in the last year:: no  Cognitive Assessment Difficulty concentrating, remembering, or making decisions? : no Will 6CIT or Mini Cog be Completed: yes What year is it?: 0 points What month is it?: 0 points Give patient an address phrase to remember (5 components): [address] [place], [place] what time is it?: 0 points Count backwards from 20 to 1: 0 points Say the months of the year in reverse: 0 points Repeat the address phrase from earlier: 0 points 6 CIT Score: 0 points  Advance Directives (For Healthcare) Does Patient Have a Medical Advance Directive?: Yes Does patient want to make changes to medical advance directive?: Yes (Inpatient -  patient requests chaplain consult to change a medical advance directive) Type of Advance Directive: Healthcare Power of [place]; Living will Copy of Healthcare Power of Attorney in Chart?: No - copy requested Copy of Living Will in Chart?: No - copy requested  Reviewed/Updated  Reviewed/Updated: Reviewed All (Medical, Surgical, Family, Medications, Allergies, Care Teams, Patient Goals)        Objective:    Today's Vitals   11/15/23 0832  Weight: 220 lb (99.8 kg)  Height: 5' 8 (1.727 m)   Body mass index is 33.45 kg/m.  Current Medications (verified) Outpatient Encounter Medications as of 11/15/2023  Medication  Sig   BABY ASPIRIN PO Take 81 mg by mouth daily.   Calcium -Vitamin D  (CALTRATE 600 PLUS-VIT D PO) Take by mouth daily.   CHOLECALCIFEROL PO Take 2,000 Units by mouth daily.   Cyanocobalamin (B-12) 1000 MCG TABS Take 1,000 mcg by mouth.   famotidine (PEPCID) 10 MG tablet Take 10 mg by mouth daily.   ketoconazole (NIZORAL) 2 % shampoo    Multiple Vitamins-Minerals (MULTIVITAMIN PO) Take by mouth daily.   Omega-3 Fatty Acids (FISH OIL PO) Take by mouth daily.   rosuvastatin  (CRESTOR ) 10 MG tablet Take     1 tablet      Daily      for Cholesterol   vitamin C (ASCORBIC ACID) 500 MG tablet Take 500 mg by mouth daily.   Zinc 25 MG TABS Take 1 tablet by mouth daily.   No facility-administered encounter medications on file as of 11/15/2023.   Hearing/Vision screen Hearing Screening - Comments:: Denies hearing difficulties   Vision Screening - Comments:: Wears rx glasses - up to date with routine eye exams with Elspeth Pinal Immunizations and Health Maintenance Health Maintenance  Topic Date Due   COVID-19 Vaccine (1) Never done   Pneumococcal Vaccine: 50+ Years (2 of 2 - PCV) 08/02/2021   Influenza Vaccine  08/03/2023   Medicare Annual Wellness (AWV)  11/14/2024   Mammogram  04/16/2025   Fecal DNA (Cologuard)  04/02/2026   DTaP/Tdap/Td (3 - Td or Tdap) 08/03/2030   DEXA SCAN  Completed   Hepatitis C Screening  Completed   Zoster Vaccines- Shingrix  Completed   Meningococcal B Vaccine  Aged Out        Assessment/Plan:  This is a routine wellness examination for Kristina Hunter.  Patient Care Team: Merna Huxley, NP as PCP - General (Family Medicine) Josefina Chew, MD as Consulting Physician (Orthopedic Surgery) Ivin Kocher, MD as Consulting Physician (Dermatology) [name], Elspeth SQUIBB, MD as Consulting Physician (Gastroenterology)  I have personally reviewed and noted the following in the patient's chart:   Medical and social history Use of alcohol, tobacco or illicit drugs   Current medications and supplements including opioid prescriptions. Functional ability and status Nutritional status Physical activity Advanced directives List of other physicians Hospitalizations, surgeries, and ER visits in previous 12 months Vitals Screenings to include cognitive, depression, and falls Referrals and appointments  No orders of the defined types were placed in this encounter.  In addition, I have reviewed and discussed with patient certain preventive protocols, quality metrics, and best practice recommendations. A written personalized care plan for preventive services as well as general preventive health recommendations were provided to patient.   Verdie CHRISTELLA Saba, CMA   11/15/2023   Return in 1 year (on 11/14/2024).  After Visit Summary: (MyChart) Due to this being a telephonic visit, the after visit summary with patients personalized plan was offered to patient via MyChart  Nurse Notes: Pt has a CPE appt today w/PCP.  Plans to get her Influenza/Pneumonia vaccine today at appt.  2026 AWV/CPE scheduled.

## 2023-11-15 NOTE — Progress Notes (Unsigned)
 Subjective:    Patient ID: Kristina Hunter, female    DOB: 12-11-1955, 68 y.o.   MRN: [id number]  HPI Patient presents for yearly preventative medicine examination. She is a pleasant 68 year old female who  has a past medical history of Allergy, Cervical disc disorder, and Hyperlipidemia.  Hyperlipidemia - managed with Crestor  10 mg daily. She denies myalgia or fatigue  Lab Results  Component Value Date   CHOL 177 11/14/2022   HDL 45 (L) 11/14/2022   LDLCALC 93 11/14/2022   TRIG 272 (H) 11/14/2022   CHOLHDL 3.9 11/14/2022    Restless Leg Syndrome - her symptoms are intermittent and she does not wish to pursue medication.   Vitamin D  Deficiency - takes OTC vitamin D   Last vitamin D  Last vitamin D  Lab Results  Component Value Date   VD25OH 47 11/14/2022    Prediabetes - this goes back to 2015. She is not currently on medication. She is working on weight loss through lifestyle modifications.  Lab Results  Component Value Date   HGBA1C 6.0 (H) 11/14/2022    GERD - takes Famotidine 10 mg daily. This works for the most part but will still have symptoms with spicy foods.   Chronic Low Back Pain -this is an ongoing issue.  She reports that she has low back pain that is worse when standing for long periods of time.  Sitting instantly alleviates her back pain.  She denies any issues with sciatica.   All immunizations and health maintenance protocols were reviewed with the patient and needed orders were placed.  Appropriate screening laboratory values were ordered for the patient including screening of hyperlipidemia, renal function and hepatic function.  Medication reconciliation,  past medical history, social history, problem list and allergies were reviewed in detail with the patient  Goals were established with regard to weight loss, exercise, and  diet in compliance with medications  End of life planning was discussed.  She is up to date on routine colon cancer screening,  in March she had a positive cologuard test and had a colonoscopy in May which showed one 10 mm polyp in the ascending colon.  Review of Systems  Constitutional: Negative.   HENT: Negative.    Eyes: Negative.   Respiratory: Negative.    Cardiovascular: Negative.   Gastrointestinal: Negative.   Endocrine: Negative.   Genitourinary: Negative.   Musculoskeletal: Negative.   Skin: Negative.   Allergic/Immunologic: Negative.   Neurological: Negative.   Hematological: Negative.   Psychiatric/Behavioral: Negative.     Past Medical History:  Diagnosis Date   Allergy    Cervical disc disorder    s/p fusion    Hyperlipidemia     Social History   Socioeconomic History   Marital status: Married    Spouse name: Not on file   Number of children: 3   Years of education: Not on file   Highest education level: 12th grade  Occupational History   Not on file  Tobacco Use   Smoking status: Never   Smokeless tobacco: Never  Vaping Use   Vaping status: Never Used  Substance and Sexual Activity   Alcohol use: No   Drug use: No   Sexual activity: Yes    Partners: Male    Birth control/protection: Post-menopausal  Other Topics Concern   Not on file  Social History Narrative   Not on file   Social Drivers of Health   Financial Resource Strain: Low Risk  (09/03/2023)  Overall Financial Resource Strain (CARDIA)    Difficulty of Paying Living Expenses: Not hard at all  Food Insecurity: No Food Insecurity (09/03/2023)   Hunger Vital Sign    Worried About Running Out of Food in the Last Year: Never true    Ran Out of Food in the Last Year: Never true  Transportation Needs: No Transportation Needs (09/03/2023)   PRAPARE - Administrator, Civil Service (Medical): No    Lack of Transportation (Non-Medical): No  Physical Activity: Insufficiently Active (09/03/2023)   Exercise Vital Sign    Days of Exercise per Week: 2 days    Minutes of Exercise per Session: 20 min  Stress: No  Stress Concern Present (09/03/2023)   [employer] of Occupational Health - Occupational Stress Questionnaire    Feeling of Stress: Only a little  Social Connections: Moderately Isolated (09/03/2023)   Social Connection and Isolation Panel    Frequency of Communication with Friends and Family: Once a week    Frequency of Social Gatherings with Friends and Family: Once a week    Attends Religious Services: 1 to 4 times per year    Active Member of [employer] or Organizations: No    Attends Engineer, Structural: Not on file    Marital Status: Married  Catering Manager Violence: Not At Risk (08/02/2020)   Humiliation, Afraid, Rape, and Kick questionnaire    Fear of Current or Ex-Partner: No    Emotionally Abused: No    Physically Abused: No    Sexually Abused: No    Past Surgical History:  Procedure Laterality Date   ABDOMINAL HYSTERECTOMY  1990   with left salpingooopherectomy   CERVICAL DISC SURGERY  2000   TUBAL LIGATION Bilateral 1986   BTL    Family History  Problem Relation Age of Onset   Arthritis Mother    Hyperlipidemia Mother    Hypertension Mother    Dementia Mother    Hypertension Father    Diabetes Father    Hyperlipidemia Father    Arthritis Father    Colon polyps Sister 32   Heart attack Paternal Grandfather        In 78s   Breast cancer Neg Hx    Colon cancer Neg Hx    Esophageal cancer Neg Hx    Rectal cancer Neg Hx    Stomach cancer Neg Hx     No Known Allergies  Current Outpatient Medications on File Prior to Visit  Medication Sig Dispense Refill   BABY ASPIRIN PO Take 81 mg by mouth daily.     Calcium -Vitamin D  (CALTRATE 600 PLUS-VIT D PO) Take by mouth daily.     CHOLECALCIFEROL PO Take 2,000 Units by mouth daily.     Cyanocobalamin (B-12) 1000 MCG TABS Take 1,000 mcg by mouth.     famotidine (PEPCID) 10 MG tablet Take 10 mg by mouth daily.     ketoconazole (NIZORAL) 2 % shampoo      Multiple Vitamins-Minerals (MULTIVITAMIN PO) Take by  mouth daily.     Omega-3 Fatty Acids (FISH OIL PO) Take by mouth daily.     rosuvastatin  (CRESTOR ) 10 MG tablet Take     1 tablet      Daily      for Cholesterol 90 tablet 3   vitamin C (ASCORBIC ACID) 500 MG tablet Take 500 mg by mouth daily.     Zinc 25 MG TABS Take 1 tablet by mouth daily.     No  current facility-administered medications on file prior to visit.    There were no vitals taken for this visit.      Objective:   Physical Exam Vitals and nursing note reviewed.  Constitutional:      General: She is not in acute distress.    Appearance: Normal appearance. She is not ill-appearing.  HENT:     Head: Normocephalic and atraumatic.     Right Ear: Tympanic membrane, ear canal and external ear normal. There is no impacted cerumen.     Left Ear: Tympanic membrane, ear canal and external ear normal. There is no impacted cerumen.     Nose: Nose normal. No congestion or rhinorrhea.     Mouth/Throat:     Mouth: Mucous membranes are moist.     Pharynx: Oropharynx is clear.  Eyes:     Extraocular Movements: Extraocular movements intact.     Conjunctiva/sclera: Conjunctivae normal.     Pupils: Pupils are equal, round, and reactive to light.  Neck:     Vascular: No carotid bruit.  Cardiovascular:     Rate and Rhythm: Normal rate and regular rhythm.     Pulses: Normal pulses.     Heart sounds: No murmur heard.    No friction rub. No gallop.  Pulmonary:     Effort: Pulmonary effort is normal.     Breath sounds: Normal breath sounds.  Abdominal:     General: Abdomen is flat. Bowel sounds are normal. There is no distension.     Palpations: Abdomen is soft. There is no mass.     Tenderness: There is no abdominal tenderness. There is no guarding or rebound.     Hernia: No hernia is present.  Musculoskeletal:        General: Normal range of motion.     Cervical back: Normal range of motion and neck supple.  Lymphadenopathy:     Cervical: No cervical adenopathy.  Skin:     General: Skin is warm and dry.     Capillary Refill: Capillary refill takes less than 2 seconds.  Neurological:     General: No focal deficit present.     Mental Status: She is alert and oriented to person, place, and time.  Psychiatric:        Mood and Affect: Mood normal.        Behavior: Behavior normal.        Thought Content: Thought content normal.        Judgment: Judgment normal.           Assessment & Plan:

## 2023-11-15 NOTE — Patient Instructions (Addendum)
 Kristina Hunter,  Thank you for taking the time for your Medicare Wellness Visit. I appreciate your continued commitment to your health goals. Please review the care plan we discussed, and feel free to reach out if I can assist you further.  Please note that Annual Wellness Visits do not include a physical exam. Some assessments may be limited, especially if the visit was conducted virtually. If needed, we may recommend an in-person follow-up with your provider.  Ongoing Care Seeing your primary care provider every 3 to 6 months helps us  monitor your health and provide consistent, personalized care.   Referrals If a referral was made during today's visit and you haven't received any updates within two weeks, please contact the referred provider directly to check on the status.  Recommended Screenings:  Health Maintenance  Topic Date Due   COVID-19 Vaccine (1) Never done   Pneumococcal Vaccine for age over 28 (2 of 2 - PCV) 08/02/2021   Flu Shot  08/03/2023   Medicare Annual Wellness Visit  11/14/2024   Breast Cancer Screening  04/16/2025   Cologuard (Stool DNA test)  04/02/2026   DTaP/Tdap/Td vaccine (3 - Td or Tdap) 08/03/2030   DEXA scan (bone density measurement)  Completed   Hepatitis C Screening  Completed   Zoster (Shingles) Vaccine  Completed   Meningitis B Vaccine  Aged Out       11/15/2023    8:33 AM  Advanced Directives  Does Patient Have a Medical Advance Directive? Yes  Type of Estate Agent of [place];Living will  Does patient want to make changes to medical advance directive? Yes (Inpatient - patient requests chaplain consult to change a medical advance directive)  Copy of Healthcare Power of Attorney in Chart? No - copy requested    Vision: Annual vision screenings are recommended for early detection of glaucoma, cataracts, and diabetic retinopathy. These exams can also reveal signs of chronic conditions such as diabetes and high blood  pressure.  Dental: Annual dental screenings help detect early signs of oral cancer, gum disease, and other conditions linked to overall health, including heart disease and diabetes.

## 2023-11-15 NOTE — Patient Instructions (Signed)
 It was great seeing you today   We will follow up with you regarding your lab work   Please let me know if you need anything   I have referred you to physical therapy for your back. Someone will call you to schedule this

## 2023-11-16 ENCOUNTER — Ambulatory Visit: Payer: Self-pay | Admitting: Adult Health

## 2023-11-16 NOTE — Progress Notes (Signed)
 Chief Complaint  Patient presents with   Medicare Wellness     Subjective:   Kristina Hunter is a 68 y.o. female who presents for an Initial Medicare Annual Wellness Visit.  I connected with  Kristina Hunter on 11/16/23 by a audio enabled telemedicine application and verified that I am speaking with the correct person using two identifiers.  Patient Location: Home  Provider Location: Office/Clinic  Persons Participating in Visit: Patient.  I discussed the limitations of evaluation and management by telemedicine. The patient expressed understanding and agreed to proceed.  Vital Signs: Because this visit was a virtual/telehealth visit, some criteria may be missing or patient reported. Any vitals not documented were not able to be obtained and vitals that have been documented are patient reported.   Allergies (verified) Patient has no known allergies.   History: Past Medical History:  Diagnosis Date   Allergy    Cervical disc disorder    s/p fusion    Hyperlipidemia    Past Surgical History:  Procedure Laterality Date   ABDOMINAL HYSTERECTOMY  1990   with left salpingooopherectomy   CERVICAL DISC SURGERY  2000   TUBAL LIGATION Bilateral 1986   BTL   Family History  Problem Relation Age of Onset   Arthritis Mother    Hyperlipidemia Mother    Hypertension Mother    Dementia Mother    Hypertension Father    Diabetes Father    Hyperlipidemia Father    Arthritis Father    Colon polyps Sister 56   Heart attack Paternal Grandfather        In 43s   Breast cancer Neg Hx    Colon cancer Neg Hx    Esophageal cancer Neg Hx    Rectal cancer Neg Hx    Stomach cancer Neg Hx    Social History   Occupational History   Not on file  Tobacco Use   Smoking status: Never   Smokeless tobacco: Never  Vaping Use   Vaping status: Never Used  Substance and Sexual Activity   Alcohol use: No   Drug use: No   Sexual activity: Yes    Partners: Male    Birth  control/protection: Post-menopausal   Tobacco Counseling Counseling given: Not Answered  SDOH Screenings   Food Insecurity: No Food Insecurity (11/15/2023)  Housing: Low Risk  (11/15/2023)  Transportation Needs: No Transportation Needs (11/15/2023)  Utilities: Not At Risk (11/15/2023)  Depression (PHQ2-9): Low Risk  (11/15/2023)  Financial Resource Strain: Low Risk  (11/15/2023)  Physical Activity: Insufficiently Active (11/15/2023)  Social Connections: Moderately Integrated (11/15/2023)  Recent Concern: Social Connections - Moderately Isolated (09/03/2023)  Stress: No Stress Concern Present (11/15/2023)  Tobacco Use: Low Risk  (11/15/2023)  Health Literacy: Adequate Health Literacy (11/15/2023)   See flowsheets for full screening details  Depression Screen PHQ 2 & 9 Depression Scale- Over the past 2 weeks, how often have you been bothered by any of the following problems? Little interest or pleasure in doing things: 0 Feeling down, depressed, or hopeless (PHQ Adolescent also includes...irritable): 0 PHQ-2 Total Score: 0 Trouble falling or staying asleep, or sleeping too much: 0 Feeling tired or having little energy: 0 Poor appetite or overeating (PHQ Adolescent also includes...weight loss): 0 Feeling bad about yourself - or that you are a failure or have let yourself or your family down: 0 Trouble concentrating on things, such as reading the newspaper or watching television (PHQ Adolescent also includes...like school work): 0 Moving or speaking  so slowly that other people could have noticed. Or the opposite - being so fidgety or restless that you have been moving around a lot more than usual: 0 Thoughts that you would be better off dead, or of hurting yourself in some way: 0 PHQ-9 Total Score: 0 If you checked off any problems, how difficult have these problems made it for you to do your work, take care of things at home, or get along with other people?: Not difficult at  all  Depression Treatment Depression Interventions/Treatment : EYV7-0 Score <4 Follow-up Not Indicated     Goals Addressed               This Visit's Progress     Patient Stated (pt-stated)        Patient stated she plans to continue stretching her limbs and restart exercising        Visit info / Clinical Intake: Medicare Wellness Visit Type:: Initial Annual Wellness Visit Persons participating in visit:: patient Medicare Wellness Visit Mode:: Telephone If telephone:: video declined Because this visit was a virtual/telehealth visit:: vitals recorded from last visit If Telephone or Video please confirm:: I connected with the patient using audio enabled telemedicine application and verified that I am speaking with the correct person using two identifiers; I discussed the limitations of evaluation and management by telemedicine; The patient expressed understanding and agreed to proceed Patient Location:: Home Provider Location:: Office Information given by:: patient Interpreter Needed?: No Pre-visit prep was completed: yes AWV questionnaire completed by patient prior to visit?: no Living arrangements:: lives with spouse/significant other Patient's Overall Health Status Rating: good Typical amount of pain: none Does pain affect daily life?: no Are you currently prescribed opioids?: no  Dietary Habits and Nutritional Risks How many meals a day?: 3 Eats fruit and vegetables daily?: yes Most meals are obtained by: preparing own meals; eating out In the last 2 weeks, have you had any of the following?: none Diabetic:: no  Functional Status Activities of Daily Living (to include ambulation/medication): Independent Ambulation: Independent with device- listed below Home Assistive Devices/Equipment: Eyeglasses Medication Administration: Independent Home Management: Independent Manage your own finances?: yes Primary transportation is: driving Concerns about vision?: no  *vision screening is required for WTM* Concerns about hearing?: no  Fall Screening Falls in the past year?: 0 Number of falls in past year: 0 Was there an injury with Fall?: 0 Fall Risk Category Calculator: 0 Patient Fall Risk Level: Low Fall Risk  Fall Risk Patient at Risk for Falls Due to: No Fall Risks Fall risk Follow up: Falls evaluation completed; Falls prevention discussed  Home and Transportation Safety: All rugs have non-skid backing?: yes All stairs or steps have railings?: yes (outside) Grab bars in the bathtub or shower?: (!) no Have non-skid surface in bathtub or shower?: yes Good home lighting?: yes Regular seat belt use?: yes Hospital stays in the last year:: no  Cognitive Assessment Difficulty concentrating, remembering, or making decisions? : no Will 6CIT or Mini Cog be Completed: yes What year is it?: 0 points What month is it?: 0 points Give patient an address phrase to remember (5 components): [address] [place], [place] About what time is it?: 0 points Count backwards from 20 to 1: 0 points Say the months of the year in reverse: 0 points Repeat the address phrase from earlier: 0 points 6 CIT Score: 0 points  Advance Directives (For Healthcare) Does Patient Have a Medical Advance Directive?: Yes Does patient want to make  changes to medical advance directive?: Yes (Inpatient - patient requests chaplain consult to change a medical advance directive) Type of Advance Directive: Healthcare Power of [place]; Living will Copy of Healthcare Power of Attorney in Chart?: No - copy requested Copy of Living Will in Chart?: No - copy requested  Reviewed/Updated  Reviewed/Updated: Reviewed All (Medical, Surgical, Family, Medications, Allergies, Care Teams, Patient Goals)        Objective:    Today's Vitals   11/15/23 0832  Weight: 220 lb (99.8 kg)  Height: 5' 8 (1.727 m)   Body mass index is 33.45 kg/m.  Current Medications (verified) Outpatient  Encounter Medications as of 11/15/2023  Medication Sig   BABY ASPIRIN PO Take 81 mg by mouth daily.   Calcium -Vitamin D  (CALTRATE 600 PLUS-VIT D PO) Take by mouth daily.   CHOLECALCIFEROL PO Take 2,000 Units by mouth daily.   Cyanocobalamin (B-12) 1000 MCG TABS Take 1,000 mcg by mouth.   famotidine (PEPCID) 10 MG tablet Take 10 mg by mouth daily.   ketoconazole (NIZORAL) 2 % shampoo    Multiple Vitamins-Minerals (MULTIVITAMIN PO) Take by mouth daily.   Omega-3 Fatty Acids (FISH OIL PO) Take by mouth daily.   rosuvastatin  (CRESTOR ) 10 MG tablet Take     1 tablet      Daily      for Cholesterol   vitamin C (ASCORBIC ACID) 500 MG tablet Take 500 mg by mouth daily.   Zinc 25 MG TABS Take 1 tablet by mouth daily.   No facility-administered encounter medications on file as of 11/15/2023.   Hearing/Vision screen Hearing Screening - Comments:: Denies hearing difficulties   Vision Screening - Comments:: Wears rx glasses - up to date with routine eye exams with Elspeth Pinal Immunizations and Health Maintenance Health Maintenance  Topic Date Due   COVID-19 Vaccine (1) Never done   Medicare Annual Wellness (AWV)  11/14/2024   Mammogram  04/16/2025   Fecal DNA (Cologuard)  04/02/2026   DTaP/Tdap/Td (3 - Td or Tdap) 08/03/2030   Pneumococcal Vaccine: 50+ Years  Completed   Influenza Vaccine  Completed   DEXA SCAN  Completed   Hepatitis C Screening  Completed   Zoster Vaccines- Shingrix  Completed   Meningococcal B Vaccine  Aged Out        Assessment/Plan:  This is a routine wellness examination for Jamarie.  Patient Care Team: Merna Huxley, NP as PCP - General (Family Medicine) Josefina Chew, MD as Consulting Physician (Orthopedic Surgery) Ivin Kocher, MD as Consulting Physician (Dermatology) [name], Elspeth SQUIBB, MD as Consulting Physician (Gastroenterology)  I have personally reviewed and noted the following in the patient's chart:   Medical and social history Use of  alcohol, tobacco or illicit drugs  Current medications and supplements including opioid prescriptions. Functional ability and status Nutritional status Physical activity Advanced directives List of other physicians Hospitalizations, surgeries, and ER visits in previous 12 months Vitals Screenings to include cognitive, depression, and falls Referrals and appointments  No orders of the defined types were placed in this encounter.  In addition, I have reviewed and discussed with patient certain preventive protocols, quality metrics, and best practice recommendations. A written personalized care plan for preventive services as well as general preventive health recommendations were provided to patient.   Verdie CHRISTELLA Saba, CMA   11/16/2023   Return in 1 year (on 11/14/2024).  After Visit Summary: (MyChart) Due to this being a telephonic visit, the after visit summary with patients personalized plan was offered  to patient via MyChart   Nurse Notes: Pt has a CPE appt today w/PCP.  Plans to get her Influenza/Pneumonia vaccine today at appt.  2026 AWV/CPE scheduled.

## 2023-11-16 NOTE — Addendum Note (Signed)
 Addended by: GERALDENE VERDIE HERO on: 11/16/2023 09:09 AM   Modules accepted: Level of Service

## 2023-11-22 ENCOUNTER — Other Ambulatory Visit: Payer: Self-pay

## 2023-11-22 ENCOUNTER — Ambulatory Visit: Attending: Adult Health

## 2023-11-22 DIAGNOSIS — R262 Difficulty in walking, not elsewhere classified: Secondary | ICD-10-CM | POA: Diagnosis not present

## 2023-11-22 DIAGNOSIS — R293 Abnormal posture: Secondary | ICD-10-CM | POA: Diagnosis not present

## 2023-11-22 DIAGNOSIS — M6281 Muscle weakness (generalized): Secondary | ICD-10-CM | POA: Diagnosis not present

## 2023-11-22 DIAGNOSIS — M545 Low back pain, unspecified: Secondary | ICD-10-CM | POA: Diagnosis not present

## 2023-11-22 DIAGNOSIS — R252 Cramp and spasm: Secondary | ICD-10-CM | POA: Diagnosis not present

## 2023-11-22 DIAGNOSIS — G8929 Other chronic pain: Secondary | ICD-10-CM | POA: Diagnosis not present

## 2023-11-22 DIAGNOSIS — M5459 Other low back pain: Secondary | ICD-10-CM | POA: Insufficient documentation

## 2023-11-22 NOTE — Therapy (Signed)
 OUTPATIENT PHYSICAL THERAPY THORACOLUMBAR EVALUATION   Patient Name: Kristina Hunter MRN: [id number] DOB:1955-05-30, 68 y.o., female Today's Date: 11/22/2023  END OF SESSION:  PT End of Session - 11/22/23 1020     Visit Number 1    Date for Recertification  01/17/24    Authorization Type [employer]    PT Start Time 1018    PT Stop Time 1100    PT Time Calculation (min) 42 min    Activity Tolerance Patient tolerated treatment well    Behavior During Therapy WFL for tasks assessed/performed          Past Medical History:  Diagnosis Date   Allergy    Cervical disc disorder    s/p fusion    Hyperlipidemia    Past Surgical History:  Procedure Laterality Date   ABDOMINAL HYSTERECTOMY  1990   with left salpingooopherectomy   CERVICAL DISC SURGERY  2000   TUBAL LIGATION Bilateral 1986   BTL   Patient Active Problem List   Diagnosis Date Noted   Persistent proteinuria 11/12/2020   Hepatic steatosis 05/13/2019   Acid reflux 05/06/2019   Vitamin D  deficiency 05/14/2018   OAB (overactive bladder) 11/06/2017   Other abnormal glucose 10/20/2014   Obesity (BMI 30.0-34.9) 10/20/2014   Labile hypertension 10/20/2014   Hyperlipidemia    Environmental allergies     PCP: Merna Huxley, NP  REFERRING PROVIDER: Merna Huxley, NP  REFERRING DIAG: M54.50,G89.29 (ICD-10-CM) - Chronic midline low back pain without sciatica  Rationale for Evaluation and Treatment: Rehabilitation  THERAPY DIAG:  Other low back pain  Muscle weakness (generalized)  Cramp and spasm  Difficulty in walking, not elsewhere classified  Abnormal posture  ONSET DATE: 11/15/2023  SUBJECTIVE:                                                                                                                                                                                           SUBJECTIVE STATEMENT: On/off back pain for about 5 years.  No injections or surgery and did not need therapy in the past.   Patient enjoys golfing but has not been able since her back has been hurting.  She keeps her 74 year old grandaughter as well in the afternoons.  She also enjoys walking for exercise.  She has some trouble with her IADL's such as standing to cook or do dishes,  and must rest but she sleeps well and can do all ADL's independently.    PERTINENT HISTORY:  na  PAIN:  Are you having pain? Yes: NPRS scale: 1/10 currently,  and at worst 6/10 Pain location: low back Pain description: aching  Aggravating factors: standing Relieving factors: resting sitting  PRECAUTIONS: None  RED FLAGS: None   WEIGHT BEARING RESTRICTIONS: No  FALLS:  Has patient fallen in last 6 months? No  LIVING ENVIRONMENT: Lives with: lives with their spouse Lives in: House/apartment  OCCUPATION: Retired  PLOF: Independent, Independent with basic ADLs, Independent with household mobility without device, Independent with community mobility without device, Independent with homemaking with ambulation, Independent with gait, and Independent with transfers  PATIENT GOALS: to be able to resume her usual activities like golf and exercise  NEXT MD VISIT: prn  OBJECTIVE:  Note: Objective measures were completed at Evaluation unless otherwise noted.  DIAGNOSTIC FINDINGS:  na  PATIENT SURVEYS:  Modified Oswestry:  MODIFIED OSWESTRY DISABILITY SCALE  Date: 11/22/23 Score  Pain intensity 3 =  Pain medication provides me with moderate relief from pain.  2. Personal care (washing, dressing, etc.) 1 =  I can take care of myself normally, but it increases my pain.  3. Lifting 1 = I can lift heavy weights, but it causes increased pain.  4. Walking 3 =  Pain prevents me from walking more than  mile.  5. Sitting 0 =  I can sit in any chair as long as I like.  6. Standing 4 =  Pain prevents me from standing more than 10 minutes.  7. Sleeping 0 = Pain does not prevent me from sleeping well.  8. Social Life 0 = My social life  is normal and does not increase my pain.  9. Traveling 0 =  I can travel anywhere without increased pain.  10. Employment/ Homemaking 1 = My normal homemaking/job activities increase my pain, but I can still perform all that is required of me  Total 13/50   Interpretation of scores: Score Category Description  0-20% Minimal Disability The patient can cope with most living activities. Usually no treatment is indicated apart from advice on lifting, sitting and exercise  21-40% Moderate Disability The patient experiences more pain and difficulty with sitting, lifting and standing. Travel and social life are more difficult and they may be disabled from work. Personal care, sexual activity and sleeping are not grossly affected, and the patient can usually be managed by conservative means  41-60% Severe Disability Pain remains the main problem in this group, but activities of daily living are affected. These patients require a detailed investigation  61-80% Crippled Back pain impinges on all aspects of the patient's life. Positive intervention is required  81-100% Bed-bound These patients are either bed-bound or exaggerating their symptoms  Bluford FORBES Zoe DELENA Karon DELENA, et al. Surgery versus conservative management of stable thoracolumbar fracture: the PRESTO feasibility RCT. [place] ([place]): [employer]; 2021 Nov. [facility] Technology Assessment, No. 25.62.) Appendix 3, Oswestry Disability Index category descriptors. Available from: Findjewelers.cz  Minimally Clinically Important Difference (MCID) = 12.8%  COGNITION: Overall cognitive status: Within functional limits for tasks assessed     SENSATION: WFL  MUSCLE LENGTH: Hamstrings: Right 45 deg; Left 50 deg Thomas test: Right pos; Left pos  POSTURE: acquired scoliosis noted on fwd flexion  PALPATION: na  LUMBAR ROM:   AROM eval  Flexion Fingertips to mid shin  Extension 50%  Right lateral  flexion Fingertips to just above joint line  Left lateral flexion Fingertips to just above joint line  Right rotation WFL  Left rotation WFL   (Blank rows = not tested)  LOWER EXTREMITY ROM:     WFL  LOWER EXTREMITY MMT:  WFL  LUMBAR SPECIAL TESTS:  Straight leg raise test: Negative, FABER test: Positive, and Thomas test: Positive  FUNCTIONAL TESTS:  5 times sit to stand: 15.25 sec Timed up and go (TUG): 7.73 sec  GAIT: Distance walked: 30 feet Assistive device utilized: None Level of assistance: Complete Independence Comments: antalgic  TREATMENT DATE: 11/22/23 Initial eval completed and initiated HEP                                                                                                                                 PATIENT EDUCATION:  Education details: Initiated HEP, educated on proper posture and encouraged lumbar roll for riding in car or sitting for prolonged periods of time, educated on pain control Person educated: Patient Education method: Programmer, Multimedia, Facilities Manager, Verbal cues, and Handouts Education comprehension: verbalized understanding, returned demonstration, and verbal cues required  HOME EXERCISE PROGRAM: Access Code: [email or web address] URL: [email or web address] Date: 11/22/2023 Prepared by: Delon Haddock  Exercises - Standing Hamstring Stretch on Chair  - 1 x daily - 7 x weekly - 1 sets - 3 reps - 30 sec hold - Quadricep Stretch with Chair and Counter Support  - 1 x daily - 7 x weekly - 1 sets - 3 reps - 30 sec hold - Seated Figure 4 Piriformis Stretch  - 1 x daily - 7 x weekly - 1 sets - 3 reps - 30 sed hold  ASSESSMENT:  CLINICAL IMPRESSION: Patient is a 68 y.o. female who was seen today for physical therapy evaluation and treatment for low back pain.  She presents with lumbar scoliosis, limited hamstring flexibility, positive Thomas test, limted hip mobility and strength along with elevated pain and decreased function.   She would benefit from LE flexibility, core strength and hip stability training,    OBJECTIVE IMPAIRMENTS: Abnormal gait, decreased balance, decreased mobility, difficulty walking, decreased ROM, decreased strength, increased fascial restrictions, increased muscle spasms, impaired flexibility, improper body mechanics, postural dysfunction, and pain.   ACTIVITY LIMITATIONS: carrying, lifting, bending, standing, squatting, sleeping, stairs, transfers, bed mobility, bathing, dressing, hygiene/grooming, and caring for others  PARTICIPATION LIMITATIONS: meal prep, cleaning, laundry, driving, shopping, community activity, yard work, and church  PERSONAL FACTORS: Fitness are also affecting patient's functional outcome.   REHAB POTENTIAL: Good  CLINICAL DECISION MAKING: Stable/uncomplicated  EVALUATION COMPLEXITY: Low   GOALS: Goals reviewed with patient? Yes  SHORT TERM GOALS: Target date: 12/20/2023  Pain report to be no greater than 4/10  Baseline: Goal status: INITIAL  2.  Patient will be independent with initial HEP  Baseline:  Goal status: INITIAL   LONG TERM GOALS: Target date: 01/17/2024  Patient to report pain no greater than 2/10  Baseline:  Goal status: INITIAL  2.  Patient to be independent with advanced HEP  Baseline:  Goal status: INITIAL  3.  ODI to improve to Baseline:  Goal status: INITIAL  4.  Functional tests (5TSTS and TUG) to improve by 2-3 sec  Baseline:  Goal status: INITIAL  5.  Patient to be able to bend, stoop and squat with pain no greater than 2/10  Baseline:  Goal status: INITIAL  6.  Patient to be able to sleep through the night  Baseline:  Goal status: INITIAL  PLAN:  PT FREQUENCY: 1-2x/week  PT DURATION: 8 weeks  PLANNED INTERVENTIONS: 97110-Therapeutic exercises, 97530- Therapeutic activity, [id number]- Neuromuscular re-education, 97535- Self Care, 02859- Manual therapy, [id number]- Gait training, [phone]- Canalith repositioning, [id number]-  Aquatic Therapy, [phone]- Electrical stimulation (unattended), [phone]- Electrical stimulation (manual), [id number]- Vasopneumatic device, [id number]- Ultrasound, [id number]- Traction (mechanical), [id number]- Ionotophoresis 4mg /ml Dexamethasone, 79439 (1-2 muscles), [id number] (3+ muscles)- Dry Needling, Patient/Family education, Balance training, Stair training, Taping, Joint mobilization, Spinal mobilization, Vestibular training, DME instructions, Cryotherapy, and Moist heat.  PLAN FOR NEXT SESSION: Nustep, review HEP, begin core stabilization   Dashea Mcmullan B. Zanai Mallari, PT 11/22/23 9:33 PM. [facility] [address], [address] [place], [place] [address] Phone # [phone] Fax [phone]

## 2023-11-23 ENCOUNTER — Encounter: Admitting: Adult Health

## 2023-11-28 ENCOUNTER — Other Ambulatory Visit: Payer: Self-pay

## 2023-11-28 DIAGNOSIS — M1712 Unilateral primary osteoarthritis, left knee: Secondary | ICD-10-CM

## 2023-12-04 ENCOUNTER — Ambulatory Visit

## 2023-12-04 DIAGNOSIS — M5459 Other low back pain: Secondary | ICD-10-CM | POA: Diagnosis present

## 2023-12-04 DIAGNOSIS — R252 Cramp and spasm: Secondary | ICD-10-CM | POA: Insufficient documentation

## 2023-12-04 DIAGNOSIS — M6281 Muscle weakness (generalized): Secondary | ICD-10-CM | POA: Diagnosis present

## 2023-12-04 DIAGNOSIS — R262 Difficulty in walking, not elsewhere classified: Secondary | ICD-10-CM | POA: Diagnosis present

## 2023-12-04 DIAGNOSIS — R293 Abnormal posture: Secondary | ICD-10-CM | POA: Insufficient documentation

## 2023-12-04 NOTE — Therapy (Signed)
 OUTPATIENT PHYSICAL THERAPY TREATMENT   Patient Name: CORITA ALLINSON MRN: [id number] DOB:1955-12-20, 68 y.o., female Today's Date: 12/04/2023  END OF SESSION:  PT End of Session - 12/04/23 1146     Visit Number 2    Date for Recertification  01/17/24    Authorization Type [employer]- 16 visits 11/20-1/15    Authorization - Visit Number 2    Authorization - Number of Visits 16    Progress Note Due on Visit 10    PT Start Time 1105    PT Stop Time 1146    PT Time Calculation (min) 41 min    Activity Tolerance Patient tolerated treatment well    Behavior During Therapy WFL for tasks assessed/performed           Past Medical History:  Diagnosis Date   Allergy    Cervical disc disorder    s/p fusion    Hyperlipidemia    Past Surgical History:  Procedure Laterality Date   ABDOMINAL HYSTERECTOMY  1990   with left salpingooopherectomy   CERVICAL DISC SURGERY  2000   TUBAL LIGATION Bilateral 1986   BTL   Patient Active Problem List   Diagnosis Date Noted   Persistent proteinuria 11/12/2020   Hepatic steatosis 05/13/2019   Acid reflux 05/06/2019   Vitamin D  deficiency 05/14/2018   OAB (overactive bladder) 11/06/2017   Other abnormal glucose 10/20/2014   Obesity (BMI 30.0-34.9) 10/20/2014   Labile hypertension 10/20/2014   Hyperlipidemia    Environmental allergies     PCP: Merna Huxley, NP  REFERRING PROVIDER: Merna Huxley, NP  REFERRING DIAG: M54.50,G89.29 (ICD-10-CM) - Chronic midline low back pain without sciatica  Rationale for Evaluation and Treatment: Rehabilitation  THERAPY DIAG:  Other low back pain  Muscle weakness (generalized)  Cramp and spasm  Difficulty in walking, not elsewhere classified  Abnormal posture  ONSET DATE: 11/15/2023  SUBJECTIVE:                                                                                                                                                                                            SUBJECTIVE STATEMENT: On/off back pain for about 5 years.  No injections or surgery and did not need therapy in the past.  Patient enjoys golfing but has not been able since her back has been hurting.  She keeps her 56 year old grandaughter as well in the afternoons.  She also enjoys walking for exercise.  She has some trouble with her IADL's such as standing to cook or do dishes,  and must rest but she sleeps well and can do all ADL's independently.  PERTINENT HISTORY:  na  PAIN: 12/04/23 Are you having pain? Yes: NPRS scale: 1/10 currently,  and at worst 6/10 Pain location: low back Pain description: aching Aggravating factors: standing Relieving factors: resting sitting  PRECAUTIONS: None  RED FLAGS: None   WEIGHT BEARING RESTRICTIONS: No  FALLS:  Has patient fallen in last 6 months? No  LIVING ENVIRONMENT: Lives with: lives with their spouse Lives in: House/apartment  OCCUPATION: Retired  PLOF: Independent, Independent with basic ADLs, Independent with household mobility without device, Independent with community mobility without device, Independent with homemaking with ambulation, Independent with gait, and Independent with transfers  PATIENT GOALS: to be able to resume her usual activities like golf and exercise  NEXT MD VISIT: prn  OBJECTIVE:  Note: Objective measures were completed at Evaluation unless otherwise noted.  DIAGNOSTIC FINDINGS:  na  PATIENT SURVEYS:  Modified Oswestry:  MODIFIED OSWESTRY DISABILITY SCALE  Date: 11/22/23 Score  Pain intensity 3 =  Pain medication provides me with moderate relief from pain.  2. Personal care (washing, dressing, etc.) 1 =  I can take care of myself normally, but it increases my pain.  3. Lifting 1 = I can lift heavy weights, but it causes increased pain.  4. Walking 3 =  Pain prevents me from walking more than  mile.  5. Sitting 0 =  I can sit in any chair as long as I like.  6. Standing 4 =  Pain prevents me  from standing more than 10 minutes.  7. Sleeping 0 = Pain does not prevent me from sleeping well.  8. Social Life 0 = My social life is normal and does not increase my pain.  9. Traveling 0 =  I can travel anywhere without increased pain.  10. Employment/ Homemaking 1 = My normal homemaking/job activities increase my pain, but I can still perform all that is required of me  Total 13/50   Interpretation of scores: Score Category Description  0-20% Minimal Disability The patient can cope with most living activities. Usually no treatment is indicated apart from advice on lifting, sitting and exercise  21-40% Moderate Disability The patient experiences more pain and difficulty with sitting, lifting and standing. Travel and social life are more difficult and they may be disabled from work. Personal care, sexual activity and sleeping are not grossly affected, and the patient can usually be managed by conservative means  41-60% Severe Disability Pain remains the main problem in this group, but activities of daily living are affected. These patients require a detailed investigation  61-80% Crippled Back pain impinges on all aspects of the patient's life. Positive intervention is required  81-100% Bed-bound These patients are either bed-bound or exaggerating their symptoms  Bluford FORBES Zoe DELENA Karon DELENA, et al. Surgery versus conservative management of stable thoracolumbar fracture: the PRESTO feasibility RCT. [place] ([place]): [employer]; 2021 Nov. [facility] Technology Assessment, No. 25.62.) Appendix 3, Oswestry Disability Index category descriptors. Available from: Findjewelers.cz  Minimally Clinically Important Difference (MCID) = 12.8%  COGNITION: Overall cognitive status: Within functional limits for tasks assessed     SENSATION: WFL  MUSCLE LENGTH: Hamstrings: Right 45 deg; Left 50 deg Thomas test: Right pos; Left pos  POSTURE: acquired scoliosis  noted on fwd flexion  PALPATION: na  LUMBAR ROM:   AROM eval  Flexion Fingertips to mid shin  Extension 50%  Right lateral flexion Fingertips to just above joint line  Left lateral flexion Fingertips to just above joint line  Right rotation  WFL  Left rotation WFL   (Blank rows = not tested)  LOWER EXTREMITY ROM:     WFL  LOWER EXTREMITY MMT:    WFL  LUMBAR SPECIAL TESTS:  Straight leg raise test: Negative, FABER test: Positive, and Thomas test: Positive  FUNCTIONAL TESTS:  5 times sit to stand: 15.25 sec Timed up and go (TUG): 7.73 sec  GAIT: Distance walked: 30 feet Assistive device utilized: None Level of assistance: Complete Independence Comments: antalgic  TREATMENT DATE: 12/04/23 Seated hamstring stretch 3x20 seconds  Seated figure 4 stretch 3x20 seconds  Standing hip flexor stretch with chair: 3x20 seconds  TA activation in supine and sitting Ball squeeze with TA activation 5 hold x 10 reps  Sidelying clam with TA activation x10 each  Nu Step: level 4x8 minutes- PT present to discuss progress  11/22/23 Initial eval completed and initiated HEP                                                                                                                                 PATIENT EDUCATION:  Education details: Initiated HEP, educated on proper posture and encouraged lumbar roll for riding in car or sitting for prolonged periods of time, educated on pain control Person educated: Patient Education method: Programmer, Multimedia, Facilities Manager, Verbal cues, and Handouts Education comprehension: verbalized understanding, returned demonstration, and verbal cues required  HOME EXERCISE PROGRAM: Access Code: [email or web address] URL: [email or web address] Date: 11/22/2023 Prepared by: Delon Haddock  Exercises - Standing Hamstring Stretch on Chair  - 1 x daily - 7 x weekly - 1 sets - 3 reps - 30 sec hold - Quadricep Stretch with Chair and Counter Support  - 1 x  daily - 7 x weekly - 1 sets - 3 reps - 30 sec hold - Seated Figure 4 Piriformis Stretch  - 1 x daily - 7 x weekly - 1 sets - 3 reps - 30 sed hold  ASSESSMENT:  CLINICAL IMPRESSION: First time follow-up after evaluation.  She has been consistent with HEP and demonstrated all aspects correctly today. PT added core exercises today and emphasized importance of incorporating TA activation into daily tasks for lumbar protection.  She was challenged Rt >Lt hip with clams.  PT monitored throughout session for technique and safety.  Patient will benefit from skilled PT to address the below impairments and improve overall function.   OBJECTIVE IMPAIRMENTS: Abnormal gait, decreased balance, decreased mobility, difficulty walking, decreased ROM, decreased strength, increased fascial restrictions, increased muscle spasms, impaired flexibility, improper body mechanics, postural dysfunction, and pain.   ACTIVITY LIMITATIONS: carrying, lifting, bending, standing, squatting, sleeping, stairs, transfers, bed mobility, bathing, dressing, hygiene/grooming, and caring for others  PARTICIPATION LIMITATIONS: meal prep, cleaning, laundry, driving, shopping, community activity, yard work, and church  PERSONAL FACTORS: Fitness are also affecting patient's functional outcome.   REHAB POTENTIAL: Good  CLINICAL DECISION MAKING: Stable/uncomplicated  EVALUATION COMPLEXITY: Low   GOALS: Goals reviewed with patient? Yes  SHORT  TERM GOALS: Target date: 12/20/2023  Pain report to be no greater than 4/10  Baseline: Goal status: INITIAL  2.  Patient will be independent with initial HEP  Baseline:  Goal status: INITIAL   LONG TERM GOALS: Target date: 01/17/2024  Patient to report pain no greater than 2/10  Baseline:  Goal status: INITIAL  2.  Patient to be independent with advanced HEP  Baseline:  Goal status: INITIAL  3.  ODI to improve to Baseline:  Goal status: INITIAL  4.  Functional tests (5TSTS  and TUG) to improve by 2-3 sec Baseline:  Goal status: INITIAL  5.  Patient to be able to bend, stoop and squat with pain no greater than 2/10  Baseline:  Goal status: INITIAL  6.  Patient to be able to sleep through the night  Baseline:  Goal status: INITIAL  PLAN:  PT FREQUENCY: 1-2x/week  PT DURATION: 8 weeks  PLANNED INTERVENTIONS: 97110-Therapeutic exercises, 97530- Therapeutic activity, [id number]- Neuromuscular re-education, 97535- Self Care, 02859- Manual therapy, [id number]- Gait training, [id number]- Canalith repositioning, [id number]- Aquatic Therapy, [id number]- Electrical stimulation (unattended), [phone]- Electrical stimulation (manual), [id number]- Vasopneumatic device, [id number]- Ultrasound, [id number]- Traction (mechanical), [id number]- Ionotophoresis 4mg /ml Dexamethasone, 79439 (1-2 muscles), 20561 (3+ muscles)- Dry Needling, Patient/Family education, Balance training, Stair training, Taping, Joint mobilization, Spinal mobilization, Vestibular training, DME instructions, Cryotherapy, and Moist heat.  PLAN FOR NEXT SESSION: Nustep, review HEP,advance core stab, weightshifting on foam    Burnard Joy, PT 12/04/23 11:47 AM  [facility] [address] [place], [place] [address] Phone # [phone] Fax [phone]

## 2023-12-12 ENCOUNTER — Ambulatory Visit

## 2023-12-12 DIAGNOSIS — R293 Abnormal posture: Secondary | ICD-10-CM

## 2023-12-12 DIAGNOSIS — R262 Difficulty in walking, not elsewhere classified: Secondary | ICD-10-CM

## 2023-12-12 DIAGNOSIS — R252 Cramp and spasm: Secondary | ICD-10-CM

## 2023-12-12 DIAGNOSIS — M5459 Other low back pain: Secondary | ICD-10-CM | POA: Diagnosis not present

## 2023-12-12 DIAGNOSIS — M6281 Muscle weakness (generalized): Secondary | ICD-10-CM

## 2023-12-12 NOTE — Therapy (Addendum)
 OUTPATIENT PHYSICAL THERAPY TREATMENT   Patient Name: Kristina Hunter MRN: [id number] DOB:06-21-1955, 68 y.o., female Today's Date: 12/12/2023  END OF SESSION:  PT End of Session - 12/12/23 1015     Visit Number 3    Date for Recertification  01/17/24    Authorization Type [employer]- 16 visits 11/20-1/15    Authorization - Visit Number 3    Authorization - Number of Visits 16    Progress Note Due on Visit 10    PT Start Time 0934    PT Stop Time 1012    PT Time Calculation (min) 38 min    Activity Tolerance Patient tolerated treatment well    Behavior During Therapy WFL for tasks assessed/performed            Past Medical History:  Diagnosis Date   Allergy    Cervical disc disorder    s/p fusion    Hyperlipidemia    Past Surgical History:  Procedure Laterality Date   ABDOMINAL HYSTERECTOMY  1990   with left salpingooopherectomy   CERVICAL DISC SURGERY  2000   TUBAL LIGATION Bilateral 1986   BTL   Patient Active Problem List   Diagnosis Date Noted   Persistent proteinuria 11/12/2020   Hepatic steatosis 05/13/2019   Acid reflux 05/06/2019   Vitamin D  deficiency 05/14/2018   OAB (overactive bladder) 11/06/2017   Other abnormal glucose 10/20/2014   Obesity (BMI 30.0-34.9) 10/20/2014   Labile hypertension 10/20/2014   Hyperlipidemia    Environmental allergies     PCP: Merna Huxley, NP  REFERRING PROVIDER: Merna Huxley, NP  REFERRING DIAG: M54.50,G89.29 (ICD-10-CM) - Chronic midline low back pain without sciatica  Rationale for Evaluation and Treatment: Rehabilitation  THERAPY DIAG:  Other low back pain  Muscle weakness (generalized)  Cramp and spasm  Difficulty in walking, not elsewhere classified  Abnormal posture  ONSET DATE: 11/15/2023  SUBJECTIVE:                                                                                                                                                                                            SUBJECTIVE STATEMENT: I'm feeling 20% better since the start of care.  I'm doing the exercises.    PERTINENT HISTORY:  na  PAIN: 12/12/23 Are you having pain? Yes: NPRS scale: 0/10 currently,  and at worst 4/10 Pain location: low back Pain description: aching Aggravating factors: standing Relieving factors: resting sitting  PRECAUTIONS: None  RED FLAGS: None   WEIGHT BEARING RESTRICTIONS: No  FALLS:  Has patient fallen in last 6 months? No  LIVING ENVIRONMENT: Lives with: lives with their spouse Lives  in: House/apartment  OCCUPATION: Retired  PLOF: Independent, Independent with basic ADLs, Independent with household mobility without device, Independent with community mobility without device, Independent with homemaking with ambulation, Independent with gait, and Independent with transfers  PATIENT GOALS: to be able to resume her usual activities like golf and exercise  NEXT MD VISIT: prn  OBJECTIVE:  Note: Objective measures were completed at Evaluation unless otherwise noted.  DIAGNOSTIC FINDINGS:  na  PATIENT SURVEYS:  Modified Oswestry:  MODIFIED OSWESTRY DISABILITY SCALE  Date: 11/22/23 Score  Pain intensity 3 =  Pain medication provides me with moderate relief from pain.  2. Personal care (washing, dressing, etc.) 1 =  I can take care of myself normally, but it increases my pain.  3. Lifting 1 = I can lift heavy weights, but it causes increased pain.  4. Walking 3 =  Pain prevents me from walking more than  mile.  5. Sitting 0 =  I can sit in any chair as long as I like.  6. Standing 4 =  Pain prevents me from standing more than 10 minutes.  7. Sleeping 0 = Pain does not prevent me from sleeping well.  8. Social Life 0 = My social life is normal and does not increase my pain.  9. Traveling 0 =  I can travel anywhere without increased pain.  10. Employment/ Homemaking 1 = My normal homemaking/job activities increase my pain, but I can still perform all  that is required of me  Total 13/50   Interpretation of scores: Score Category Description  0-20% Minimal Disability The patient can cope with most living activities. Usually no treatment is indicated apart from advice on lifting, sitting and exercise  21-40% Moderate Disability The patient experiences more pain and difficulty with sitting, lifting and standing. Travel and social life are more difficult and they may be disabled from work. Personal care, sexual activity and sleeping are not grossly affected, and the patient can usually be managed by conservative means  41-60% Severe Disability Pain remains the main problem in this group, but activities of daily living are affected. These patients require a detailed investigation  61-80% Crippled Back pain impinges on all aspects of the patients life. Positive intervention is required  81-100% Bed-bound These patients are either bed-bound or exaggerating their symptoms  Bluford FORBES Zoe DELENA Karon DELENA, et al. Surgery versus conservative management of stable thoracolumbar fracture: the PRESTO feasibility RCT. [place] ([place]): [employer]; 2021 Nov. [facility] Technology Assessment, No. 25.62.) Appendix 3, Oswestry Disability Index category descriptors. Available from: Findjewelers.cz  Minimally Clinically Important Difference (MCID) = 12.8%  COGNITION: Overall cognitive status: Within functional limits for tasks assessed     SENSATION: WFL  MUSCLE LENGTH: Hamstrings: Right 45 deg; Left 50 deg Thomas test: Right pos; Left pos  POSTURE: acquired scoliosis noted on fwd flexion  PALPATION: na  LUMBAR ROM:   AROM eval  Flexion Fingertips to mid shin  Extension 50%  Right lateral flexion Fingertips to just above joint line  Left lateral flexion Fingertips to just above joint line  Right rotation WFL  Left rotation WFL   (Blank rows = not tested)  LOWER EXTREMITY ROM:     WFL  LOWER EXTREMITY  MMT:    WFL  LUMBAR SPECIAL TESTS:  Straight leg raise test: Negative, FABER test: Positive, and Thomas test: Positive  FUNCTIONAL TESTS:  5 times sit to stand: 15.25 sec Timed up and go (TUG): 7.73 sec  GAIT: Distance walked: 30  feet Assistive device utilized: None Level of assistance: Complete Independence Comments: antalgic  TREATMENT DATE: 12/12/23 Seated hamstring stretch 3x20 seconds  Seated figure 4 stretch 3x20 seconds  Standing hip flexor stretch with chair: 3x20 seconds  TA activation in supine and sitting Ball squeeze with TA activation 5 hold x 10 reps  Sidelying clam with TA activation x10 each  Nu Step: level 5x8 minutes- PT present to discuss progress Standing on balance pad: weight shifting 3 ways x1.5 min each  Sit to stand 2x10 Single knee to chest 3x20 seconds   12/04/23 Seated hamstring stretch 3x20 seconds  Seated figure 4 stretch 3x20 seconds  Standing hip flexor stretch with chair: 3x20 seconds  TA activation in supine and sitting Ball squeeze with TA activation 5 hold x 10 reps  Sidelying clam with TA activation x10 each  Nu Step: level 4x8 minutes- PT present to discuss progress  11/22/23 Initial eval completed and initiated HEP                                                                                                                                 PATIENT EDUCATION:  Education details: Initiated HEP, educated on proper posture and encouraged lumbar roll for riding in car or sitting for prolonged periods of time, educated on pain control Person educated: Patient Education method: Programmer, Multimedia, Facilities Manager, Verbal cues, and Handouts Education comprehension: verbalized understanding, returned demonstration, and verbal cues required  HOME EXERCISE PROGRAM: Access Code: [email or web address] URL: [email or web address] Date: 12/12/2023 Prepared by: Burnard  Exercises - Standing Hamstring Stretch on Chair  - 1 x daily - 7 x weekly - 1  sets - 3 reps - 30 sec hold - Theatre Manager with Chair and Counter Support  - 1 x daily - 7 x weekly - 1 sets - 3 reps - 30 sec hold - Seated Figure 4 Piriformis Stretch  - 1 x daily - 7 x weekly - 1 sets - 3 reps - 30 sed hold - Supine Transversus Abdominis Bracing - Hands on Stomach  - 5 x daily - 7 x weekly - 1 sets - 10 reps - 5 hold - Seated Transversus Abdominis Bracing  - 5 x daily - 7 x weekly - 1 sets - 10 reps - 5 hold - Clamshell  - 1 x daily - 7 x weekly - 2 sets - 10 reps - Supine Hip Adduction Isometric with Ball  - 1 x daily - 7 x weekly - 2 sets - 10 reps - 5 hold - Supine Lower Trunk Rotation  - 2 x daily - 7 x weekly - 1 sets - 3 reps - 20 hold - Sit to Stand Without Arm Support  - 1-2 x daily - 7 x weekly - 2 sets - 10 reps - Supine Single Knee to Chest Stretch  - 2 x daily - 7 x weekly - 1 sets - 3 reps - 20 hold  ASSESSMENT:  CLINICAL IMPRESSION: Pt reports 20% improvement in symptoms since the start of care.  She is independent in her HEP and is activating her transversus abdominus throughout the day with activity. She was challenged on balance pad and improved with increased time.  PT provided cueing and supervision for safety.  Patient will benefit from skilled PT to address the below impairments and improve overall function.   OBJECTIVE IMPAIRMENTS: Abnormal gait, decreased balance, decreased mobility, difficulty walking, decreased ROM, decreased strength, increased fascial restrictions, increased muscle spasms, impaired flexibility, improper body mechanics, postural dysfunction, and pain.   ACTIVITY LIMITATIONS: carrying, lifting, bending, standing, squatting, sleeping, stairs, transfers, bed mobility, bathing, dressing, hygiene/grooming, and caring for others  PARTICIPATION LIMITATIONS: meal prep, cleaning, laundry, driving, shopping, community activity, yard work, and church  PERSONAL FACTORS: Fitness are also affecting patient's functional outcome.   REHAB  POTENTIAL: Good  CLINICAL DECISION MAKING: Stable/uncomplicated  EVALUATION COMPLEXITY: Low   GOALS: Goals reviewed with patient? Yes  SHORT TERM GOALS: Target date: 12/20/2023  Pain report to be no greater than 4/10  Baseline: 4/10 max (12/12/23) Goal status:  MET  2.  Patient will be independent with initial HEP  Baseline: 12/12/23 Goal status: MET   LONG TERM GOALS: Target date: 01/17/2024  Patient to report pain no greater than 2/10  Baseline:  Goal status: INITIAL  2.  Patient to be independent with advanced HEP  Baseline:  Goal status: INITIAL  3.  ODI to improve to Baseline:  Goal status: INITIAL  4.  Functional tests (5TSTS and TUG) to improve by 2-3 sec Baseline:  Goal status: INITIAL  5.  Patient to be able to bend, stoop and squat with pain no greater than 2/10  Baseline:  Goal status: INITIAL  6.  Patient to be able to sleep through the night  Baseline:  Goal status: INITIAL  PLAN:  PT FREQUENCY: 1-2x/week  PT DURATION: 8 weeks  PLANNED INTERVENTIONS: 97110-Therapeutic exercises, 97530- Therapeutic activity, [id number]- Neuromuscular re-education, 97535- Self Care, [id number]- Manual therapy, [id number]- Gait training, [phone]- Canalith repositioning, [id number]- Aquatic Therapy, [phone]- Electrical stimulation (unattended), [phone]- Electrical stimulation (manual), [id number]- Vasopneumatic device, [id number]- Ultrasound, [id number]- Traction (mechanical), [id number]- Ionotophoresis 4mg /ml Dexamethasone, [id number] (1-2 muscles), [id number] (3+ muscles)- Dry Needling, Patient/Family education, Balance training, Stair training, Taping, Joint mobilization, Spinal mobilization, Vestibular training, DME instructions, Cryotherapy, and Moist heat.  PLAN FOR NEXT SESSION: Nustep, review HEP, advance core stab, standing strength. Test 5x sit to stand    Burnard Joy, PT 12/12/23 10:21 AM  [facility] [address], [address] [place], [place] [address] Phone # [phone] Fax  [phone]

## 2023-12-17 ENCOUNTER — Ambulatory Visit

## 2023-12-17 DIAGNOSIS — M5459 Other low back pain: Secondary | ICD-10-CM

## 2023-12-17 DIAGNOSIS — R293 Abnormal posture: Secondary | ICD-10-CM

## 2023-12-17 DIAGNOSIS — M6281 Muscle weakness (generalized): Secondary | ICD-10-CM

## 2023-12-17 DIAGNOSIS — R252 Cramp and spasm: Secondary | ICD-10-CM

## 2023-12-17 DIAGNOSIS — R262 Difficulty in walking, not elsewhere classified: Secondary | ICD-10-CM

## 2023-12-17 NOTE — Therapy (Signed)
 OUTPATIENT PHYSICAL THERAPY TREATMENT   Patient Name: Kristina Hunter MRN: [id number] DOB:April 05, 1955, 68 y.o., female Today's Date: 12/17/2023  END OF SESSION:  PT End of Session - 12/17/23 1015     Visit Number 4    Date for Recertification  01/17/24    Authorization Type [employer]- 16 visits 11/20-1/15    Authorization - Visit Number 4    Authorization - Number of Visits 16    Progress Note Due on Visit 10    PT Start Time 0934    PT Stop Time 1014    PT Time Calculation (min) 40 min    Activity Tolerance Patient tolerated treatment well    Behavior During Therapy [facility] for tasks assessed/performed             Past Medical History:  Diagnosis Date   Allergy    Cervical disc disorder    s/p fusion    Hyperlipidemia    Past Surgical History:  Procedure Laterality Date   ABDOMINAL HYSTERECTOMY  1990   with left salpingooopherectomy   CERVICAL DISC SURGERY  2000   TUBAL LIGATION Bilateral 1986   BTL   Patient Active Problem List   Diagnosis Date Noted   Persistent proteinuria 11/12/2020   Hepatic steatosis 05/13/2019   Acid reflux 05/06/2019   Vitamin D  deficiency 05/14/2018   OAB (overactive bladder) 11/06/2017   Other abnormal glucose 10/20/2014   Obesity (BMI 30.0-34.9) 10/20/2014   Labile hypertension 10/20/2014   Hyperlipidemia    Environmental allergies     PCP: Merna Huxley, NP  REFERRING PROVIDER: Merna Huxley, NP  REFERRING DIAG: M54.50,G89.29 (ICD-10-CM) - Chronic midline low back pain without sciatica  Rationale for Evaluation and Treatment: Rehabilitation  THERAPY DIAG:  Other low back pain  Muscle weakness (generalized)  Cramp and spasm  Difficulty in walking, not elsewhere classified  Abnormal posture  ONSET DATE: 11/15/2023  SUBJECTIVE:                                                                                                                                                                                            SUBJECTIVE STATEMENT: I have a cramp in my Lt hamstring today.  Overall feeling 20% better.    PERTINENT HISTORY:  na  PAIN: 12/17/23 Are you having pain? Yes: NPRS scale: 0/10 currently,  and at worst 4/10 Pain location: low back Pain description: aching Aggravating factors: standing Relieving factors: resting sitting  PRECAUTIONS: None  RED FLAGS: None   WEIGHT BEARING RESTRICTIONS: No  FALLS:  Has patient fallen in last 6 months? No  LIVING ENVIRONMENT: Lives with: lives with their spouse  Lives in: House/apartment  OCCUPATION: Retired  PLOF: Independent, Independent with basic ADLs, Independent with household mobility without device, Independent with community mobility without device, Independent with homemaking with ambulation, Independent with gait, and Independent with transfers  PATIENT GOALS: to be able to resume her usual activities like golf and exercise  NEXT MD VISIT: prn  OBJECTIVE:  Note: Objective measures were completed at Evaluation unless otherwise noted.  DIAGNOSTIC FINDINGS:  na  PATIENT SURVEYS:  Modified Oswestry:  MODIFIED OSWESTRY DISABILITY SCALE  Date: 11/22/23 Score  Pain intensity 3 =  Pain medication provides me with moderate relief from pain.  2. Personal care (washing, dressing, etc.) 1 =  I can take care of myself normally, but it increases my pain.  3. Lifting 1 = I can lift heavy weights, but it causes increased pain.  4. Walking 3 =  Pain prevents me from walking more than  mile.  5. Sitting 0 =  I can sit in any chair as long as I like.  6. Standing 4 =  Pain prevents me from standing more than 10 minutes.  7. Sleeping 0 = Pain does not prevent me from sleeping well.  8. Social Life 0 = My social life is normal and does not increase my pain.  9. Traveling 0 =  I can travel anywhere without increased pain.  10. Employment/ Homemaking 1 = My normal homemaking/job activities increase my pain, but I can still perform all that  is required of me  Total 13/50   Interpretation of scores: Score Category Description  0-20% Minimal Disability The patient can cope with most living activities. Usually no treatment is indicated apart from advice on lifting, sitting and exercise  21-40% Moderate Disability The patient experiences more pain and difficulty with sitting, lifting and standing. Travel and social life are more difficult and they may be disabled from work. Personal care, sexual activity and sleeping are not grossly affected, and the patient can usually be managed by conservative means  41-60% Severe Disability Pain remains the main problem in this group, but activities of daily living are affected. These patients require a detailed investigation  61-80% Crippled Back pain impinges on all aspects of the patients life. Positive intervention is required  81-100% Bed-bound These patients are either bed-bound or exaggerating their symptoms  Bluford FORBES Zoe DELENA Karon DELENA, et al. Surgery versus conservative management of stable thoracolumbar fracture: the PRESTO feasibility RCT. [place] ([place]): [employer]; 2021 Nov. [facility] Technology Assessment, No. 25.62.) Appendix 3, Oswestry Disability Index category descriptors. Available from: Findjewelers.cz  Minimally Clinically Important Difference (MCID) = 12.8%  COGNITION: Overall cognitive status: Within functional limits for tasks assessed     SENSATION: WFL  MUSCLE LENGTH: Hamstrings: Right 45 deg; Left 50 deg Thomas test: Right pos; Left pos  POSTURE: acquired scoliosis noted on fwd flexion  PALPATION: na  LUMBAR ROM:   AROM eval  Flexion Fingertips to mid shin  Extension 50%  Right lateral flexion Fingertips to just above joint line  Left lateral flexion Fingertips to just above joint line  Right rotation WFL  Left rotation WFL   (Blank rows = not tested)  LOWER EXTREMITY ROM:     WFL  LOWER EXTREMITY MMT:     WFL  LUMBAR SPECIAL TESTS:  Straight leg raise test: Negative, FABER test: Positive, and Thomas test: Positive  FUNCTIONAL TESTS:  5 times sit to stand: 15.25 sec Timed up and go (TUG): 7.73 sec  12/17/23: 12.6 seconds  without UE support  GAIT: Distance walked: 30 feet Assistive device utilized: None Level of assistance: Complete Independence Comments: antalgic  TREATMENT DATE: 12/17/23 Seated hamstring stretch 3x20 seconds  Seated figure 4 stretch 3x20 seconds  Sit to stand x5  Standing hip flexor stretch with chair: 3x20 seconds  TA activation in supine and sitting Ball squeeze with TA activation 5 hold x 10 reps  Sidelying clam with TA activation x10 each  Nu Step: level 5x8 minutes- PT present to discuss progress Standing on balance pad: weight shifting 3 ways x1.5 min each  Sit to stand 2x10 Single knee to chest 3x20 seconds   12/12/23 Seated hamstring stretch 3x20 seconds  Seated figure 4 stretch 3x20 seconds  Standing hip flexor stretch with chair: 3x20 seconds  TA activation in supine and sitting Ball squeeze with TA activation 5 hold x 10 reps  Sidelying clam with TA activation x10 each  Nu Step: level 5x8 minutes- PT present to discuss progress Standing on balance pad: weight shifting 3 ways x1.5 min each  Sit to stand 2x10 Single knee to chest 3x20 seconds   12/04/23 Seated hamstring stretch 3x20 seconds  Seated figure 4 stretch 3x20 seconds  Standing hip flexor stretch with chair: 3x20 seconds  TA activation in supine and sitting Ball squeeze with TA activation 5 hold x 10 reps  Sidelying clam with TA activation x10 each  Nu Step: level 4x8 minutes- PT present to discuss progress  PATIENT EDUCATION:  Education details: Initiated HEP, educated on proper posture and encouraged lumbar roll for riding in car or sitting for prolonged periods of time, educated on pain control Person educated: Patient Education method: Programmer, Multimedia, Facilities Manager,  Verbal cues, and Handouts Education comprehension: verbalized understanding, returned demonstration, and verbal cues required  HOME EXERCISE PROGRAM: Access Code: [email or web address] URL: [email or web address] Date: 12/12/2023 Prepared by: Burnard  Exercises - Standing Hamstring Stretch on Chair  - 1 x daily - 7 x weekly - 1 sets - 3 reps - 30 sec hold - Theatre Manager with Chair and Counter Support  - 1 x daily - 7 x weekly - 1 sets - 3 reps - 30 sec hold - Seated Figure 4 Piriformis Stretch  - 1 x daily - 7 x weekly - 1 sets - 3 reps - 30 sed hold - Supine Transversus Abdominis Bracing - Hands on Stomach  - 5 x daily - 7 x weekly - 1 sets - 10 reps - 5 hold - Seated Transversus Abdominis Bracing  - 5 x daily - 7 x weekly - 1 sets - 10 reps - 5 hold - Clamshell  - 1 x daily - 7 x weekly - 2 sets - 10 reps - Supine Hip Adduction Isometric with Ball  - 1 x daily - 7 x weekly - 2 sets - 10 reps - 5 hold - Supine Lower Trunk Rotation  - 2 x daily - 7 x weekly - 1 sets - 3 reps - 20 hold - Sit to Stand Without Arm Support  - 1-2 x daily - 7 x weekly - 2 sets - 10 reps - Supine Single Knee to Chest Stretch  - 2 x daily - 7 x weekly - 1 sets - 3 reps - 20 hold  ASSESSMENT:  CLINICAL IMPRESSION: Pt continues to report 20% improvement in symptoms since the start of care.  5x sit to stand is improved since the start of care, indicating improved balance.  Pt did well with advancement of exercises in  standing today.  She has her most pain with standing activities and pain resolves quickly.  Max pain is now 4/10 and she is sleeping all night without pain now. PT provided cueing and supervision for safety.  Patient will benefit from skilled PT to address the below impairments and improve overall function.   OBJECTIVE IMPAIRMENTS: Abnormal gait, decreased balance, decreased mobility, difficulty walking, decreased ROM, decreased strength, increased fascial restrictions, increased muscle spasms,  impaired flexibility, improper body mechanics, postural dysfunction, and pain.   ACTIVITY LIMITATIONS: carrying, lifting, bending, standing, squatting, sleeping, stairs, transfers, bed mobility, bathing, dressing, hygiene/grooming, and caring for others  PARTICIPATION LIMITATIONS: meal prep, cleaning, laundry, driving, shopping, community activity, yard work, and church  PERSONAL FACTORS: Fitness are also affecting patient's functional outcome.   REHAB POTENTIAL: Good  CLINICAL DECISION MAKING: Stable/uncomplicated  EVALUATION COMPLEXITY: Low   GOALS: Goals reviewed with patient? Yes  SHORT TERM GOALS: Target date: 12/20/2023  Pain report to be no greater than 4/10  Baseline: 4/10 max (12/12/23) Goal status:  MET  2.  Patient will be independent with initial HEP  Baseline: 12/12/23 Goal status: MET   LONG TERM GOALS: Target date: 01/17/2024  Patient to report pain no greater than 2/10  Baseline: 4/10 (12/17/23) Goal status: In progress   2.  Patient to be independent with advanced HEP  Baseline:  Goal status: INITIAL  3.  ODI to improve to  Baseline:  Goal status: INITIAL  4.  Functional tests (5TSTS and TUG) to improve by 2-3 sec Baseline: 5x sit to stand 12.6 seconds (12/17/23) Goal status: in progress   5.  Patient to be able to bend, stoop and squat with pain no greater than 2/10  Baseline:  Goal status: INITIAL  6.  Patient to be able to sleep through the night  Baseline: not waking with pain (12/17/23) Goal status: MET  PLAN:  PT FREQUENCY: 1-2x/week  PT DURATION: 8 weeks  PLANNED INTERVENTIONS: 97110-Therapeutic exercises, 97530- Therapeutic activity, 97112- Neuromuscular re-education, 97535- Self Care, 02859- Manual therapy, [id number]- Gait training, [phone]- Canalith repositioning, [id number]- Aquatic Therapy, [phone]- Electrical stimulation (unattended), [phone]- Electrical stimulation (manual), [id number]- Vasopneumatic device, [id number]- Ultrasound, [id number]- Traction  (mechanical), [id number]- Ionotophoresis 4mg /ml Dexamethasone, 79439 (1-2 muscles), [id number] (3+ muscles)- Dry Needling, Patient/Family education, Balance training, Stair training, Taping, Joint mobilization, Spinal mobilization, Vestibular training, DME instructions, Cryotherapy, and Moist heat.  PLAN FOR NEXT SESSION: Nustep, review HEP, advance core stab, standing strength.   Burnard Joy, PT 12/17/2023 10:27 AM  [facility] [address], [address] [place], [place] [address] Phone # [phone] Fax [phone]

## 2023-12-26 ENCOUNTER — Ambulatory Visit

## 2024-01-01 ENCOUNTER — Ambulatory Visit

## 2024-01-07 ENCOUNTER — Ambulatory Visit: Attending: Adult Health

## 2024-01-07 DIAGNOSIS — M5459 Other low back pain: Secondary | ICD-10-CM | POA: Diagnosis present

## 2024-01-07 DIAGNOSIS — R262 Difficulty in walking, not elsewhere classified: Secondary | ICD-10-CM | POA: Insufficient documentation

## 2024-01-07 DIAGNOSIS — M6281 Muscle weakness (generalized): Secondary | ICD-10-CM | POA: Diagnosis present

## 2024-01-07 DIAGNOSIS — R293 Abnormal posture: Secondary | ICD-10-CM | POA: Diagnosis present

## 2024-01-07 NOTE — Therapy (Signed)
 " OUTPATIENT PHYSICAL THERAPY TREATMENT   Patient Name: Kristina Hunter MRN: [id number] DOB:1955/11/27, 69 y.o., female Today's Date: 01/07/2024  END OF SESSION:  PT End of Session - 01/07/24 1011     Visit Number 5    Date for Recertification  01/17/24    Authorization Type [employer]- 16 visits 11/20-1/15    Authorization - Visit Number 5    Authorization - Number of Visits 16    Progress Note Due on Visit 10    PT Start Time 0931    PT Stop Time 1013    PT Time Calculation (min) 42 min    Activity Tolerance Patient tolerated treatment well    Behavior During Therapy WFL for tasks assessed/performed              Past Medical History:  Diagnosis Date   Allergy    Cervical disc disorder    s/p fusion    Hyperlipidemia    Past Surgical History:  Procedure Laterality Date   ABDOMINAL HYSTERECTOMY  1990   with left salpingooopherectomy   CERVICAL DISC SURGERY  2000   TUBAL LIGATION Bilateral 1986   BTL   Patient Active Problem List   Diagnosis Date Noted   Persistent proteinuria 11/12/2020   Hepatic steatosis 05/13/2019   Acid reflux 05/06/2019   Vitamin D  deficiency 05/14/2018   OAB (overactive bladder) 11/06/2017   Other abnormal glucose 10/20/2014   Obesity (BMI 30.0-34.9) 10/20/2014   Labile hypertension 10/20/2014   Hyperlipidemia    Environmental allergies     PCP: Merna Huxley, NP  REFERRING PROVIDER: Merna Huxley, NP  REFERRING DIAG: M54.50,G89.29 (ICD-10-CM) - Chronic midline low back pain without sciatica  Rationale for Evaluation and Treatment: Rehabilitation  THERAPY DIAG:  Other low back pain  Muscle weakness (generalized)  Abnormal posture  Difficulty in walking, not elsewhere classified  ONSET DATE: 11/15/2023  SUBJECTIVE:                                                                                                                                                                                           SUBJECTIVE  STATEMENT: I've had really terrible vertigo so I had to miss treatment.  It is worse when I'm in bed and roll to the side.   I haven't been able to do my exercises laying down.    PERTINENT HISTORY:  na  PAIN: 01/07/23 Are you having pain? Yes: NPRS scale: 0/10 currently,  and at worst 5-6/10 Pain location: low back Pain description: aching Aggravating factors: standing Relieving factors: resting sitting  PRECAUTIONS: None  RED FLAGS: None   WEIGHT BEARING RESTRICTIONS: No  FALLS:  Has patient fallen in last 6 months? No  LIVING ENVIRONMENT: Lives with: lives with their spouse Lives in: House/apartment  OCCUPATION: Retired  PLOF: Independent, Independent with basic ADLs, Independent with household mobility without device, Independent with community mobility without device, Independent with homemaking with ambulation, Independent with gait, and Independent with transfers  PATIENT GOALS: to be able to resume her usual activities like golf and exercise  NEXT MD VISIT: prn  OBJECTIVE:  Note: Objective measures were completed at Evaluation unless otherwise noted.  DIAGNOSTIC FINDINGS:  na  PATIENT SURVEYS:  Modified Oswestry:  MODIFIED OSWESTRY DISABILITY SCALE  Date: 11/22/23 Score  Pain intensity 3 =  Pain medication provides me with moderate relief from pain.  2. Personal care (washing, dressing, etc.) 1 =  I can take care of myself normally, but it increases my pain.  3. Lifting 1 = I can lift heavy weights, but it causes increased pain.  4. Walking 3 =  Pain prevents me from walking more than  mile.  5. Sitting 0 =  I can sit in any chair as long as I like.  6. Standing 4 =  Pain prevents me from standing more than 10 minutes.  7. Sleeping 0 = Pain does not prevent me from sleeping well.  8. Social Life 0 = My social life is normal and does not increase my pain.  9. Traveling 0 =  I can travel anywhere without increased pain.  10. Employment/ Homemaking 1 = My  normal homemaking/job activities increase my pain, but I can still perform all that is required of me  Total 13/50   Interpretation of scores: Score Category Description  0-20% Minimal Disability The patient can cope with most living activities. Usually no treatment is indicated apart from advice on lifting, sitting and exercise  21-40% Moderate Disability The patient experiences more pain and difficulty with sitting, lifting and standing. Travel and social life are more difficult and they may be disabled from work. Personal care, sexual activity and sleeping are not grossly affected, and the patient can usually be managed by conservative means  41-60% Severe Disability Pain remains the main problem in this group, but activities of daily living are affected. These patients require a detailed investigation  61-80% Crippled Back pain impinges on all aspects of the patients life. Positive intervention is required  81-100% Bed-bound These patients are either bed-bound or exaggerating their symptoms  Bluford FORBES Zoe DELENA Karon DELENA, et al. Surgery versus conservative management of stable thoracolumbar fracture: the PRESTO feasibility RCT. [place] ([place]): [employer]; 2021 Nov. [facility] Technology Assessment, No. 25.62.) Appendix 3, Oswestry Disability Index category descriptors. Available from: [email or web address].cz  Minimally Clinically Important Difference (MCID) = 12.8%  COGNITION: Overall cognitive status: Within functional limits for tasks assessed     SENSATION: WFL  MUSCLE LENGTH: Hamstrings: Right 45 deg; Left 50 deg Thomas test: Right pos; Left pos  POSTURE: acquired scoliosis noted on fwd flexion  PALPATION: na  LUMBAR ROM:   AROM eval  Flexion Fingertips to mid shin  Extension 50%  Right lateral flexion Fingertips to just above joint line  Left lateral flexion Fingertips to just above joint line  Right rotation WFL  Left rotation WFL    (Blank rows = not tested)  LOWER EXTREMITY ROM:     WFL  LOWER EXTREMITY MMT:    WFL  LUMBAR SPECIAL TESTS:  Straight leg raise test: Negative, FABER test: Positive, and Thomas test: Positive  FUNCTIONAL TESTS:  5 times sit to stand: 15.25 sec Timed up and go (TUG): 7.73 sec  12/17/23: 12.6 seconds without UE support  GAIT: Distance walked: 30 feet Assistive device utilized: None Level of assistance: Complete Independence Comments: antalgic  TREATMENT DATE: 01/07/24 Seated hamstring stretch 3x20 seconds  Seated figure 4 stretch 3x20 seconds  Vestibular Assessment performed by Jarrell Laming, PT:  Trenda Craze negative on right side, Dix Hallpike positive on left side.  Proceeded with Epley Maneuver x2 for canalith repositioning.  No nystagmus or dizziness reported on 2nd maneuver. Self Epley to the Lt issued for HEP with review of video and visuals  TA activation in supine and sitting Ball squeeze with TA activation 5 hold x 10 reps- seated Seated core: yellow ball diagonals (shoulder to hip), hip to hip 2x10 Nu Step: level 5x5 minutes- PT present to discuss progress Sit to stand 2x5    12/17/23 Seated hamstring stretch 3x20 seconds  Seated figure 4 stretch 3x20 seconds  Sit to stand x5  Standing hip flexor stretch with chair: 3x20 seconds  TA activation in supine and sitting Ball squeeze with TA activation 5 hold x 10 reps  Sidelying clam with TA activation x10 each  Nu Step: level 5x8 minutes- PT present to discuss progress Standing on balance pad: weight shifting 3 ways x1.5 min each  Sit to stand 2x10 Single knee to chest 3x20 seconds   12/12/23 Seated hamstring stretch 3x20 seconds  Seated figure 4 stretch 3x20 seconds  Standing hip flexor stretch with chair: 3x20 seconds  TA activation in supine and sitting Ball squeeze with TA activation 5 hold x 10 reps  Sidelying clam with TA activation x10 each  Nu Step: level 5x8 minutes- PT present to discuss  progress Standing on balance pad: weight shifting 3 ways x1.5 min each  Sit to stand 2x10 Single knee to chest 3x20 seconds    PATIENT EDUCATION:  Education details: Initiated HEP, educated on proper posture and encouraged lumbar roll for riding in car or sitting for prolonged periods of time, educated on pain control Person educated: Patient Education method: Programmer, Multimedia, Facilities Manager, Verbal cues, and Handouts Education comprehension: verbalized understanding, returned demonstration, and verbal cues required  HOME EXERCISE PROGRAM: Access Code: [email or web address] URL: [email or web address] Date: 01/07/2024 Prepared by: Burnard  Exercises - Standing Hamstring Stretch on Chair  - 1 x daily - 7 x weekly - 1 sets - 3 reps - 30 sec hold - Theatre Manager with Chair and Counter Support  - 1 x daily - 7 x weekly - 1 sets - 3 reps - 30 sec hold - Seated Figure 4 Piriformis Stretch  - 1 x daily - 7 x weekly - 1 sets - 3 reps - 30 sed hold - Supine Transversus Abdominis Bracing - Hands on Stomach  - 5 x daily - 7 x weekly - 1 sets - 10 reps - 5 hold - Seated Transversus Abdominis Bracing  - 5 x daily - 7 x weekly - 1 sets - 10 reps - 5 hold - Clamshell  - 1 x daily - 7 x weekly - 2 sets - 10 reps - Supine Hip Adduction Isometric with Ball  - 1 x daily - 7 x weekly - 2 sets - 10 reps - 5 hold - Supine Lower Trunk Rotation  - 2 x daily - 7 x weekly - 1 sets - 3 reps - 20 hold - Sit to Stand Without Arm Support  - 1-2 x daily - 7 x weekly -  2 sets - 10 reps - Supine Single Knee to Chest Stretch  - 2 x daily - 7 x weekly - 1 sets - 3 reps - 20 hold - Self-Epley Maneuver Left Ear  - 1 x daily - 7 x weekly - 1 sets - 1 reps  ASSESSMENT:  CLINICAL IMPRESSION: Lapse in treatment since pt was  dizzy around Christmas.  PT assessed for vertigo with positive testing on Lt.  Issued self-Epley for home if symptoms resolve. Pt has not been able to do many of her exercises at home due to vertigo and  she will get back to consistency this week.  PT provided cueing and supervision for safety.  Patient will benefit from skilled PT to address the below impairments and improve overall function.   OBJECTIVE IMPAIRMENTS: Abnormal gait, decreased balance, decreased mobility, difficulty walking, decreased ROM, decreased strength, increased fascial restrictions, increased muscle spasms, impaired flexibility, improper body mechanics, postural dysfunction, and pain.   ACTIVITY LIMITATIONS: carrying, lifting, bending, standing, squatting, sleeping, stairs, transfers, bed mobility, bathing, dressing, hygiene/grooming, and caring for others  PARTICIPATION LIMITATIONS: meal prep, cleaning, laundry, driving, shopping, community activity, yard work, and church  PERSONAL FACTORS: Fitness are also affecting patient's functional outcome.   REHAB POTENTIAL: Good  CLINICAL DECISION MAKING: Stable/uncomplicated  EVALUATION COMPLEXITY: Low   GOALS: Goals reviewed with patient? Yes  SHORT TERM GOALS: Target date: 12/20/2023  Pain report to be no greater than 4/10  Baseline: 4/10 max (12/12/23) Goal status:  MET  2.  Patient will be independent with initial HEP  Baseline: 12/12/23 Goal status: MET   LONG TERM GOALS: Target date: 01/17/2024  Patient to report pain no greater than 2/10  Baseline: 4/10 (12/17/23) Goal status: In progress   2.  Patient to be independent with advanced HEP  Baseline:  Goal status: INITIAL  3.  ODI to improve to  Baseline:  Goal status: INITIAL  4.  Functional tests (5TSTS and TUG) to improve by 2-3 sec Baseline: 5x sit to stand 12.6 seconds (12/17/23) Goal status: in progress   5.  Patient to be able to bend, stoop and squat with pain no greater than 2/10  Baseline:  Goal status: INITIAL  6.  Patient to be able to sleep through the night  Baseline: not waking with pain (12/17/23) Goal status: MET  PLAN:  PT FREQUENCY: 1-2x/week  PT DURATION: 8  weeks  PLANNED INTERVENTIONS: 97110-Therapeutic exercises, 97530- Therapeutic activity, 97112- Neuromuscular re-education, 97535- Self Care, [id number]- Manual therapy, [id number]- Gait training, [phone]- Canalith repositioning, [id number]- Aquatic Therapy, [phone]- Electrical stimulation (unattended), [phone]- Electrical stimulation (manual), [id number]- Vasopneumatic device, [id number]- Ultrasound, [id number]- Traction (mechanical), [id number]- Ionotophoresis 4mg /ml Dexamethasone, [id number] (1-2 muscles), [id number] (3+ muscles)- Dry Needling, Patient/Family education, Balance training, Stair training, Taping, Joint mobilization, Spinal mobilization, Vestibular training, DME instructions, Cryotherapy, and Moist heat.  PLAN FOR NEXT SESSION: Nustep, review HEP, advance core stab, standing strength. ERO next to discuss future PT   Burnard Joy, PT 01/07/2024 10:27 AM  [facility] [address], [address] [place], [place] [address] Phone # [phone] Fax [phone]  "

## 2024-01-14 ENCOUNTER — Ambulatory Visit

## 2024-11-19 ENCOUNTER — Ambulatory Visit

## 2024-11-19 ENCOUNTER — Encounter: Admitting: Adult Health
